# Patient Record
Sex: Female | Born: 1960 | Race: White | Hispanic: No | Smoking: Current every day smoker
Health system: Southern US, Community
[De-identification: ages and names within clinical notes are randomized; demographics above are authoritative.]

## PROBLEM LIST (undated history)

## (undated) DIAGNOSIS — I739 Peripheral vascular disease, unspecified: Secondary | ICD-10-CM

## (undated) DIAGNOSIS — J439 Emphysema, unspecified: Secondary | ICD-10-CM

## (undated) DIAGNOSIS — Z86718 Personal history of other venous thrombosis and embolism: Secondary | ICD-10-CM

## (undated) DIAGNOSIS — E079 Disorder of thyroid, unspecified: Secondary | ICD-10-CM

## (undated) DIAGNOSIS — C801 Malignant (primary) neoplasm, unspecified: Secondary | ICD-10-CM

## (undated) DIAGNOSIS — E119 Type 2 diabetes mellitus without complications: Secondary | ICD-10-CM

## (undated) DIAGNOSIS — M1990 Unspecified osteoarthritis, unspecified site: Secondary | ICD-10-CM

## (undated) DIAGNOSIS — F32A Depression, unspecified: Secondary | ICD-10-CM

## (undated) DIAGNOSIS — J45909 Unspecified asthma, uncomplicated: Secondary | ICD-10-CM

## (undated) DIAGNOSIS — J449 Chronic obstructive pulmonary disease, unspecified: Secondary | ICD-10-CM

## (undated) DIAGNOSIS — F329 Major depressive disorder, single episode, unspecified: Secondary | ICD-10-CM

## (undated) HISTORY — DX: Malignant (primary) neoplasm, unspecified: C80.1

## (undated) HISTORY — DX: Peripheral vascular disease, unspecified: I73.9

## (undated) HISTORY — DX: Unspecified osteoarthritis, unspecified site: M19.90

## (undated) HISTORY — DX: Disorder of thyroid, unspecified: E07.9

## (undated) HISTORY — DX: Type 2 diabetes mellitus without complications: E11.9

## (undated) HISTORY — DX: Major depressive disorder, single episode, unspecified: F32.9

## (undated) HISTORY — DX: Depression, unspecified: F32.A

## (undated) HISTORY — DX: Chronic obstructive pulmonary disease, unspecified: J44.9

## (undated) HISTORY — DX: Personal history of other venous thrombosis and embolism: Z86.718

## (undated) HISTORY — DX: Unspecified asthma, uncomplicated: J45.909

---
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
## 1991-01-09 HISTORY — PX: TUBAL LIGATION: SHX77

## 2011-01-09 HISTORY — PX: THYROIDECTOMY: SHX17

## 2013-01-08 HISTORY — PX: OTHER SURGICAL HISTORY: SHX169

## 2015-07-04 ENCOUNTER — Other Ambulatory Visit: Payer: Self-pay | Admitting: Family Medicine

## 2015-07-04 DIAGNOSIS — Z1231 Encounter for screening mammogram for malignant neoplasm of breast: Secondary | ICD-10-CM

## 2016-03-06 ENCOUNTER — Encounter: Payer: Self-pay | Admitting: Orthopedic Surgery

## 2016-03-06 ENCOUNTER — Ambulatory Visit (INDEPENDENT_AMBULATORY_CARE_PROVIDER_SITE_OTHER): Payer: Medicaid Other | Admitting: Orthopedic Surgery

## 2016-03-06 VITALS — BP 118/78 | HR 89 | Wt 171.0 lb

## 2016-03-06 DIAGNOSIS — G5622 Lesion of ulnar nerve, left upper limb: Secondary | ICD-10-CM | POA: Diagnosis not present

## 2016-03-06 NOTE — Progress Notes (Signed)
Patient ID: Shannon Russell, female   DOB: 1961/01/07, 56 y.o.   MRN: [id number]  Chief Complaint  Patient presents with  . Hand Pain    left hand and wrist numbness and pain    HPI Shannon Russell is a 55 y.o. female.  The patient presents with an 8 year history of pain and paresthesias in her left upper extremity involving her left small and ring finger. She has tried gabapentin and Lyrica without success. 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 She also complains of pain in the right in the left arm associated with numbness and tingling and she has weakness and she is dropping things when she tries to pick them up  Review of Systems Review of Systems (2 MINIMUM)  No past medical history on file.  No past surgical history on file.  Social History Social History  Substance Use Topics  . Smoking status: Current Every Day Smoker  . Smokeless tobacco: Never Used  . Alcohol use Not on file    Allergies  Allergen Reactions  . Iodine     Current Meds  Medication Sig  . atorvastatin (LIPITOR) 40 MG tablet Take 40 mg by mouth daily.  . clopidogrel (PLAVIX) 75 MG tablet Take 75 mg by mouth daily.  . DULoxetine (CYMBALTA) 60 MG capsule Take 60 mg by mouth daily.  Marland Kitchen gabapentin (NEURONTIN) 300 MG capsule Take 300 mg by mouth daily.  Marland Kitchen levothyroxine (SYNTHROID, LEVOTHROID) 125 MCG tablet Take 125 mcg by mouth 2 (two) times daily.  . meloxicam (MOBIC) 15 MG tablet Take 15 mg by mouth daily.  . metFORMIN (GLUCOPHAGE) 500 MG tablet Take 500 mg by mouth 2 (two) times daily with a meal.  . pregabalin (LYRICA) 75 MG capsule Take 75 mg by mouth 3 (three) times daily.  Marland Kitchen topiramate (TOPAMAX) 50 MG tablet Take 50 mg by mouth 2 (two) times daily.      Physical Exam Physical Exam 1.BP 118/78   Pulse 89   Wt 171  lb (77.6 kg)   2. Gen. appearance. The patient is well-developed and well-nourished, grooming and hygiene are normal. There are no gross congenital abnormalities  3. The patient is alert and oriented to person place and time  4. Mood and affect are normal  5. Ambulation No disturbance in her overall ambulatory ability or gait  Examination reveals the following: 6. On inspection we find tenderness over the left elbow positive Tinel's at the olecranon/medial upper condyle  7. With the range of motion of  normal range of motion of the elbow  8. Stability tests were normal  varus valgus stress test  9. Strength tests revealed grade 5 motor strength  10. Skin we find no rash ulceration or erythema  11. 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

## 2016-03-06 NOTE — Patient Instructions (Addendum)
SEE OCCUPATIONAL THERAPIST FOR SPLINT OF THE ELBOW   WEAR FOR 6 WEEKS THEN COME BACK   WORK ON SMOKING Cubital Tunnel Syndrome Cubital tunnel syndrome is a condition that causes pain and weakness of the forearm and hand. This condition happens when one of the nerves (ulnar nerve) that runs alongside the elbow joint becomes irritated. What are the causes? Causes of this condition include:  Increased pressure on the ulnar nerve at the elbow, arm, or forearm. This can be caused by:  Swollen tissues.  Ligaments.  Muscles.  Poorly healed elbow fractures.  Tumors in the elbow. These are usually noncancerous (benign).  Scar tissue that develops in the elbow after an injury.  Bony growths (spurs) near the ulnar nerve.  Stretching of the nerve due to loose elbow ligaments.  Trauma to the nerve at the elbow.  Repetitive elbow bending.  Certain medical conditions. What increases the risk? This condition is more likely to develop in:  People who do manual labor that requires frequently bending the elbow.  People who play sports that include repeated or strenuous throwing motions, such as baseball.  People who play contact sports, such as football or lacrosse.  People who do not warm up properly before activities.  People who have diabetes.  People who have an underactive thyroid (hypothyroidism). What are the signs or symptoms? Symptoms of this condition include:  Clumsiness or weakness of the hand.  Tenderness of the inner elbow.  Aching or soreness of the inner elbow, forearm, or fingers, especially the little finger or the ring finger.  Increased pain with forced elbow bending.  Reduced control when throwing.  Tingling, numbness, or burning inside the forearm, or in part of the hand or fingers, especially the little finger or the ring finger.  Sharp pains that shoot from the elbow down to the wrist and hand.  The inability to grip or pinch hard. How is this  diagnosed? This condition is diagnosed with a medical history and physical exam. Your health care provider will ask about your symptoms and ask for details about any injury. You may also have other tests, including:  Electromyogram (EMG). This test checks how well the nerve is working.  X-ray. How is this treated? Treatment starts by stopping the activities that are causing your symptoms to get worse. Treatment may include the use of icing and medicines to reduce pain and swelling. You may also be advised to wear a splint to prevent your elbow from bending or wear an elbow pad where the ulnar nerve is closest to the skin. In less severe cases, treatment may also include working with a physical therapist:  To help decrease your symptoms.  To improve the strength and range of motion of your elbow, forearm, and hand. If the treatments described above do not help, surgery may be needed. Follow these instructions at home: If you have a splint:   Wear it as told by your health care provider. Remove it only as told by your health care provider.  Loosen the splint if your fingers become numb and tingle, or if they turn cold and blue.  Keep the splint clean and dry. Managing pain, stiffness, and swelling   If directed, apply ice to the injured area:  Put ice in a plastic bag.  Place a towel between your skin and the bag.  Leave the ice on for 20 minutes, 2-3 times per day.  Move your fingers often to avoid stiffness and to lessen swelling.  Raise (elevate) the injured area above the level of your heart while you are sitting or lying down. General instructions   Take over-the-counter and prescription medicines only as told by your health care provider.  Keep all follow-up visits as told by your health care provider. This is important.  Do any exercise or physical therapy as told by your health care provider.  Do not drive or operate heavy machinery while taking prescription pain  medicine.  If you were given an elbow pad, wear it as told by your health care provider. Contact a health care provider if:  Your symptoms get worse.  Your symptoms do not get better with treatment.  Your have new pain.  Your hand on the injured side feels numb or cold. This information is not intended to replace advice given to you by your health care provider. Make sure you discuss any questions you have with your health care provider. Document Released: 12/25/2004 Document Revised: 06/02/2015 Document Reviewed: 03/03/2014 Elsevier Interactive Patient Education  2017 [employer].

## 2016-03-12 ENCOUNTER — Ambulatory Visit (HOSPITAL_COMMUNITY): Payer: Medicaid Other | Attending: Orthopedic Surgery

## 2016-03-12 DIAGNOSIS — M25522 Pain in left elbow: Secondary | ICD-10-CM | POA: Diagnosis not present

## 2016-03-12 NOTE — Therapy (Signed)
[facility] [place], [place], [address] Phone: [phone]   Fax:  [phone]  Occupational Therapy Treatment  Patient Details  Name: Donnae Donna MRN: [id number] Date of Birth: [birth year] Referring Provider: Arther Abbott, MD  Encounter Date: 03/12/2016      OT End of Session - 03/12/16 1245    Visit Number 1   Number of Visits 1   Date for OT Re-Evaluation 03/19/16   Authorization Type medicaid   Authorization Time Period coverage for 1 evaluation a year.   Authorization - Visit Number 1   Authorization - Number of Visits 1   OT Start Time 1030   OT Stop Time 1100   OT Time Calculation (min) 30 min   Activity Tolerance Patient tolerated treatment well   Behavior During Therapy WFL for tasks assessed/performed      Past Medical History:  Diagnosis Date  . Arthritis   . Asthma   . Cancer ([place])   . COPD (chronic obstructive pulmonary disease) ([place])   . Depression   . Diabetes mellitus without complication ([place])   . H/O blood clots   . PAD (peripheral artery disease) ([place])   . Thyroid disease     Past Surgical History:  Procedure Laterality Date  . Bypass right leg  2015  . CHOLECYSTECTOMY  1981  . Mesh implant right leg  2015  . THYROIDECTOMY  2013  . TUBAL LIGATION  1993    There were no vitals filed for this visit.      Subjective Assessment - 03/12/16 1037    Subjective  S: I hit my elbow on a door. It's been hurting for 9 months.   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 patient reports pain and tingling/numbness in her 4th and 5th digit that runs up her arm into her elbow. Dr. Aline Brochure has referred patient to occupational therapy for elbow splint fabrication.   Patient Stated Goals To be pain free.   Currently in Pain? Yes   Pain Score 5    Pain Location Elbow   Pain Orientation Left   Pain Descriptors / Indicators  Tingling;Numbness   Pain Type Chronic pain   Pain Onset More than a month ago  6 months   Pain Frequency Constant   Aggravating Factors  Constant. All the time it hurts.   Pain Relieving Factors Nothing   Multiple Pain Sites No            [facility] OT Assessment - 03/12/16 1243      Assessment   Diagnosis left cupital tunnel splint   Referring Provider Arther Abbott, MD   Onset Date --  9 months ago   Assessment Follow up appointment in 6 weeks   Prior Therapy None     Precautions   Precautions None     Restrictions   Weight Bearing Restrictions No     Balance Screen   Has the patient fallen in the past 6 months No     Prior Function   Level of Independence Independent   Vocation Retired     Public librarian Status Independent     Written Expression   Dominant Hand Right     Cognition   Overall Cognitive Status Within Functional Limits for tasks assessed     ROM / Strength   AROM / PROM / Strength AROM     AROM  Overall AROM  Within functional limits for tasks performed   Overall AROM Comments Patient has functional ROM for elbow, shoulder, and hand in all ranges.                  OT Treatments/Exercises (OP) - 03/12/16 1245      Splinting   Splinting Elbow extension splint fabricated with 3 straps placed. Education completed and patient had no additional questions.                OT Education - 03/12/16 1136    Education provided Yes   Education Details Patient was given education regarding wearing schedule, precautions, and cleaning techniques.   Person(s) Educated Patient   Methods Explanation;Handout   Comprehension Verbalized understanding          OT Short Term Goals - 03/12/16 1249      OT SHORT TERM GOAL #1   Title Elbow extension splint will be fabricated and patient will be able to verbalize understanding of donning/doffing, cleaning techniques, precautions, and wearing schedule.    Time 1   Period Days    Status Achieved                  Plan - 03/12/16 1246    Clinical Impression Statement A: Patient is a 56 y/o female S/P left cupital tunnel syndrome presenting to OT for splint fabrication in hopes of decreasing numbness, tingling, and pain level in order to be able to sleep as well as complete daily tasks with more comfort. Elbow extension splint was fabricated with splint placed on dorsal side of elbow to decrease chance of pressure area. Will follow up with patient on Wednesday 03/14/16 before discharging fully.    Rehab Potential Excellent   OT Frequency One time visit   OT Treatment/Interventions Splinting   Plan P: Follow up with patient on Wednesday 03/14/16 before discharging to make sure there are no issues with splint.   Consulted and Agree with Plan of Care Patient      Patient will benefit from skilled therapeutic intervention in order to improve the following deficits and impairments:  Pain, Impaired sensation  Visit Diagnosis: Pain in left elbow - Plan: Ot plan of care cert/re-cert    Problem List There are no active problems to display for this patient.  Ailene Ravel, OTR/L,CBIS  [phone]  03/12/2016, 3:10 PM  [facility] [address] [place], [place], [address] Phone: [phone]   Fax:  [phone]  Name: Anju Haithcock MRN: [id number] Date of Birth: [birth year]

## 2016-03-12 NOTE — Patient Instructions (Signed)
Your Splint This splint should initially be fitted by a healthcare practitioner.  The healthcare practitioner is responsible for providing wearing instructions and precautions to the patient, other healthcare practitioners and care provider involved in the patient's care.  This splint was custom made for you. Please read the following instructions to learn about wearing and caring for your splint.  Precautions Should your splint cause any of the following problems, remove the splint immediately and contact your therapist/physician.  Swelling  Severe Pain  Pressure Areas  Stiffness  Numbness  Do not wear your splint while operating machinery unless it has been fabricated for that purpose.  When To Wear Your Splint Where your splint according to your therapist/physician instructions. Nights and rest periods only  Care and Cleaning of Your Splint 1. Keep your splint away from open flames. 2. Your splint will lose its shape in temperatures over 135 degrees Farenheit, ( in car windows, near radiators, ovens or in hot water).  Never make any adjustments to your splint, if the splint needs adjusting remove it and make an appointment to see your therapist. 3. Your splint, including the cushion liner may be cleaned with soap and lukewarm water.  Do not immerse in hot water over 135 degrees Farenheit. 4. Straps may be washed with soap and water, but do not moisten the self-adhesive portion. 5. For ink or hard to remove spots use a scouring cleanser which contains chlorine.  Rinse the splint thoroughly after using chlorine cleanser.  

## 2016-04-17 ENCOUNTER — Ambulatory Visit: Payer: Medicaid Other | Admitting: Orthopedic Surgery

## 2016-05-01 ENCOUNTER — Ambulatory Visit: Payer: Medicaid Other | Admitting: Orthopedic Surgery

## 2016-05-02 ENCOUNTER — Encounter: Payer: Self-pay | Admitting: Orthopedic Surgery

## 2017-03-05 ENCOUNTER — Other Ambulatory Visit: Payer: Self-pay | Admitting: Family

## 2017-03-07 ENCOUNTER — Other Ambulatory Visit: Payer: Self-pay | Admitting: Family

## 2017-03-07 DIAGNOSIS — E785 Hyperlipidemia, unspecified: Secondary | ICD-10-CM

## 2017-03-07 DIAGNOSIS — I4891 Unspecified atrial fibrillation: Secondary | ICD-10-CM

## 2017-03-07 DIAGNOSIS — R222 Localized swelling, mass and lump, trunk: Secondary | ICD-10-CM

## 2017-03-07 DIAGNOSIS — I739 Peripheral vascular disease, unspecified: Secondary | ICD-10-CM

## 2017-03-14 ENCOUNTER — Encounter: Payer: Self-pay | Admitting: Gastroenterology

## 2017-03-15 ENCOUNTER — Ambulatory Visit: Payer: Medicaid Other

## 2017-04-04 ENCOUNTER — Other Ambulatory Visit (HOSPITAL_COMMUNITY): Payer: Self-pay | Admitting: Family

## 2017-04-04 ENCOUNTER — Other Ambulatory Visit: Payer: Self-pay | Admitting: Family

## 2017-04-04 DIAGNOSIS — R222 Localized swelling, mass and lump, trunk: Secondary | ICD-10-CM

## 2017-04-15 ENCOUNTER — Ambulatory Visit: Payer: Medicaid Other

## 2017-05-08 ENCOUNTER — Encounter: Payer: Self-pay | Admitting: Nurse Practitioner

## 2017-05-21 ENCOUNTER — Ambulatory Visit: Payer: Medicaid Other | Admitting: Nurse Practitioner

## 2017-06-06 ENCOUNTER — Ambulatory Visit: Payer: Medicaid Other | Admitting: Nurse Practitioner

## 2017-06-06 ENCOUNTER — Encounter: Payer: Self-pay | Admitting: Gastroenterology

## 2017-06-06 ENCOUNTER — Telehealth: Payer: Self-pay | Admitting: Nurse Practitioner

## 2017-06-06 NOTE — Telephone Encounter (Signed)
Patient was a no show and letter sent  °

## 2017-06-06 NOTE — Progress Notes (Deleted)
Primary Care Physician:  Tollie Eth, NP Primary Gastroenterologist:  Dr.   Rayne Du chief complaint on file.   HPI:   Shannon Russell is a 57 y.o. female who presents on referral from primary care to schedule colonoscopy.  Nurse/phone triage was deferred to office visit due to polypharmacy.  No history of colonoscopy or endoscopy in our system.  Today she states   Past Medical History:  Diagnosis Date  . Arthritis   . Asthma   . Cancer ([place])   . COPD (chronic obstructive pulmonary disease) ([place])   . Depression   . Diabetes mellitus without complication ([place])   . H/O blood clots   . PAD (peripheral artery disease) ([place])   . Thyroid disease     Past Surgical History:  Procedure Laterality Date  . Bypass right leg  2015  . CHOLECYSTECTOMY  1981  . Mesh implant right leg  2015  . THYROIDECTOMY  2013  . TUBAL LIGATION  1993    Current Outpatient Medications  Medication Sig Dispense Refill  . atorvastatin (LIPITOR) 40 MG tablet Take 40 mg by mouth daily.    . clopidogrel (PLAVIX) 75 MG tablet Take 75 mg by mouth daily.    . DULoxetine (CYMBALTA) 60 MG capsule Take 60 mg by mouth daily.    Marland Kitchen gabapentin (NEURONTIN) 300 MG capsule Take 300 mg by mouth daily.    Marland Kitchen levothyroxine (SYNTHROID, LEVOTHROID) 125 MCG tablet Take 125 mcg by mouth 2 (two) times daily.    . meloxicam (MOBIC) 15 MG tablet Take 15 mg by mouth daily.    . metFORMIN (GLUCOPHAGE) 500 MG tablet Take 500 mg by mouth 2 (two) times daily with a meal.    . pregabalin (LYRICA) 75 MG capsule Take 75 mg by mouth 3 (three) times daily.    Marland Kitchen topiramate (TOPAMAX) 50 MG tablet Take 50 mg by mouth 2 (two) times daily.     No current facility-administered medications for this visit.     Allergies as of 06/06/2017 - Review Complete 03/06/2016  Allergen Reaction Noted  . Iodine  03/06/2016    Family History  Problem Relation Age of Onset  . Heart failure Mother   . Depression Mother   . Peripheral Artery Disease  Sister   . Cancer Sister   . Clotting disorder Sister   . Depression Sister   . Thyroid disease Sister     Social History   Socioeconomic History  . Marital status: Legally Separated    Spouse name: Not on file  . Number of children: Not on file  . Years of education: Not on file  . Highest education level: Not on file  Occupational History  . Not on file  Social Needs  . Financial resource strain: Not on file  . Food insecurity:    Worry: Not on file    Inability: Not on file  . Transportation needs:    Medical: Not on file    Non-medical: Not on file  Tobacco Use  . Smoking status: Current Every Day Smoker  . Smokeless tobacco: Never Used  Substance and Sexual Activity  . Alcohol use: Not on file  . Drug use: Not on file  . Sexual activity: Not on file  Lifestyle  . Physical activity:    Days per week: Not on file    Minutes per session: Not on file  . Stress: Not on file  Relationships  . Social connections:    Talks on phone:  Not on file    Gets together: Not on file    Attends religious service: Not on file    Active member of club or organization: Not on file    Attends meetings of clubs or organizations: Not on file    Relationship status: Not on file  . Intimate partner violence:    Fear of current or ex partner: Not on file    Emotionally abused: Not on file    Physically abused: Not on file    Forced sexual activity: Not on file  Other Topics Concern  . Not on file  Social History Narrative  . Not on file    Review of Systems: General: Negative for anorexia, weight loss, fever, chills, fatigue, weakness. Eyes: Negative for vision changes.  ENT: Negative for hoarseness, difficulty swallowing , nasal congestion. CV: Negative for chest pain, angina, palpitations, dyspnea on exertion, peripheral edema.  Respiratory: Negative for dyspnea at rest, dyspnea on exertion, cough, sputum, wheezing.  GI: See history of present illness. GU:  Negative for  dysuria, hematuria, urinary incontinence, urinary frequency, nocturnal urination.  MS: Negative for joint pain, low back pain.  Derm: Negative for rash or itching.  Neuro: Negative for weakness, abnormal sensation, seizure, frequent headaches, memory loss, confusion.  Psych: Negative for anxiety, depression, suicidal ideation, hallucinations.  Endo: Negative for unusual weight change.  Heme: Negative for bruising or bleeding. Allergy: Negative for rash or hives.    Physical Exam: There were no vitals taken for this visit. General:   Alert and oriented. Pleasant and cooperative. Well-nourished and well-developed.  Head:  Normocephalic and atraumatic. Eyes:  Without icterus, sclera clear and conjunctiva pink.  Ears:  Normal auditory acuity. Mouth:  No deformity or lesions, oral mucosa pink.  Throat/Neck:  Supple, without mass or thyromegaly. Cardiovascular:  S1, S2 present without murmurs appreciated. Normal pulses noted. Extremities without clubbing or edema. Respiratory:  Clear to auscultation bilaterally. No wheezes, rales, or rhonchi. No distress.  Gastrointestinal:  +BS, soft, non-tender and non-distended. No HSM noted. No guarding or rebound. No masses appreciated.  Rectal:  Deferred  Musculoskalatal:  Symmetrical without gross deformities. Normal posture. Skin:  Intact without significant lesions or rashes. Neurologic:  Alert and oriented x4;  grossly normal neurologically. Psych:  Alert and cooperative. Normal mood and affect. Heme/Lymph/Immune: No significant cervical adenopathy. No excessive bruising noted.    06/06/2017 1:16 PM   Disclaimer: This note was dictated with voice recognition software. Similar sounding words can inadvertently be transcribed and may not be corrected upon review.

## 2017-06-07 NOTE — Telephone Encounter (Signed)
Noted  

## 2018-04-20 ENCOUNTER — Inpatient Hospital Stay (HOSPITAL_COMMUNITY)
Admission: EM | Admit: 2018-04-20 | Discharge: 2018-04-23 | DRG: 190 | Disposition: A | Discharge status: Home / Self Care | Payer: Medicaid Other | Attending: Family Medicine | Admitting: Family Medicine

## 2018-04-20 ENCOUNTER — Emergency Department (HOSPITAL_COMMUNITY): Payer: Medicaid Other

## 2018-04-20 ENCOUNTER — Other Ambulatory Visit: Payer: Self-pay

## 2018-04-20 ENCOUNTER — Encounter (HOSPITAL_COMMUNITY): Payer: Self-pay | Admitting: Emergency Medicine

## 2018-04-20 DIAGNOSIS — E669 Obesity, unspecified: Secondary | ICD-10-CM | POA: Diagnosis not present

## 2018-04-20 DIAGNOSIS — Z20828 Contact with and (suspected) exposure to other viral communicable diseases: Secondary | ICD-10-CM | POA: Diagnosis present

## 2018-04-20 DIAGNOSIS — E1165 Type 2 diabetes mellitus with hyperglycemia: Secondary | ICD-10-CM | POA: Diagnosis present

## 2018-04-20 DIAGNOSIS — Z888 Allergy status to other drugs, medicaments and biological substances status: Secondary | ICD-10-CM

## 2018-04-20 DIAGNOSIS — J441 Chronic obstructive pulmonary disease with (acute) exacerbation: Secondary | ICD-10-CM | POA: Diagnosis present

## 2018-04-20 DIAGNOSIS — E890 Postprocedural hypothyroidism: Secondary | ICD-10-CM | POA: Diagnosis present

## 2018-04-20 DIAGNOSIS — Z7984 Long term (current) use of oral hypoglycemic drugs: Secondary | ICD-10-CM | POA: Diagnosis not present

## 2018-04-20 DIAGNOSIS — Z7902 Long term (current) use of antithrombotics/antiplatelets: Secondary | ICD-10-CM | POA: Diagnosis not present

## 2018-04-20 DIAGNOSIS — Z86718 Personal history of other venous thrombosis and embolism: Secondary | ICD-10-CM

## 2018-04-20 DIAGNOSIS — Z818 Family history of other mental and behavioral disorders: Secondary | ICD-10-CM | POA: Diagnosis not present

## 2018-04-20 DIAGNOSIS — E1151 Type 2 diabetes mellitus with diabetic peripheral angiopathy without gangrene: Secondary | ICD-10-CM | POA: Diagnosis present

## 2018-04-20 DIAGNOSIS — Z79890 Hormone replacement therapy (postmenopausal): Secondary | ICD-10-CM

## 2018-04-20 DIAGNOSIS — F17200 Nicotine dependence, unspecified, uncomplicated: Secondary | ICD-10-CM

## 2018-04-20 DIAGNOSIS — G43909 Migraine, unspecified, not intractable, without status migrainosus: Secondary | ICD-10-CM | POA: Diagnosis present

## 2018-04-20 DIAGNOSIS — I739 Peripheral vascular disease, unspecified: Secondary | ICD-10-CM

## 2018-04-20 DIAGNOSIS — M1990 Unspecified osteoarthritis, unspecified site: Secondary | ICD-10-CM | POA: Diagnosis not present

## 2018-04-20 DIAGNOSIS — F17208 Nicotine dependence, unspecified, with other nicotine-induced disorders: Secondary | ICD-10-CM | POA: Diagnosis not present

## 2018-04-20 DIAGNOSIS — F329 Major depressive disorder, single episode, unspecified: Secondary | ICD-10-CM | POA: Diagnosis present

## 2018-04-20 DIAGNOSIS — Z79899 Other long term (current) drug therapy: Secondary | ICD-10-CM | POA: Diagnosis not present

## 2018-04-20 DIAGNOSIS — F17210 Nicotine dependence, cigarettes, uncomplicated: Secondary | ICD-10-CM | POA: Diagnosis present

## 2018-04-20 DIAGNOSIS — R6889 Other general symptoms and signs: Secondary | ICD-10-CM

## 2018-04-20 DIAGNOSIS — E785 Hyperlipidemia, unspecified: Secondary | ICD-10-CM | POA: Diagnosis present

## 2018-04-20 DIAGNOSIS — E079 Disorder of thyroid, unspecified: Secondary | ICD-10-CM | POA: Diagnosis present

## 2018-04-20 DIAGNOSIS — Z6831 Body mass index (BMI) 31.0-31.9, adult: Secondary | ICD-10-CM

## 2018-04-20 DIAGNOSIS — I493 Ventricular premature depolarization: Secondary | ICD-10-CM | POA: Diagnosis not present

## 2018-04-20 DIAGNOSIS — Z20822 Contact with and (suspected) exposure to covid-19: Secondary | ICD-10-CM

## 2018-04-20 DIAGNOSIS — Z8249 Family history of ischemic heart disease and other diseases of the circulatory system: Secondary | ICD-10-CM

## 2018-04-20 DIAGNOSIS — J9601 Acute respiratory failure with hypoxia: Secondary | ICD-10-CM | POA: Diagnosis present

## 2018-04-20 DIAGNOSIS — J449 Chronic obstructive pulmonary disease, unspecified: Secondary | ICD-10-CM | POA: Diagnosis present

## 2018-04-20 LAB — CBC WITH DIFFERENTIAL/PLATELET
Abs Immature Granulocytes: 0.07 10*3/uL (ref 0.00–0.07)
Basophils Absolute: 0.1 10*3/uL (ref 0.0–0.1)
Basophils Relative: 0 %
Eosinophils Absolute: 1.2 10*3/uL — ABNORMAL HIGH (ref 0.0–0.5)
Eosinophils Relative: 7 %
HCT: 43.1 % (ref 36.0–46.0)
Hemoglobin: 14.3 g/dL (ref 12.0–15.0)
Immature Granulocytes: 0 %
Lymphocytes Relative: 24 %
Lymphs Abs: 4 10*3/uL (ref 0.7–4.0)
MCH: 31.8 pg (ref 26.0–34.0)
MCHC: 33.2 g/dL (ref 30.0–36.0)
MCV: 96 fL (ref 80.0–100.0)
Monocytes Absolute: 1 10*3/uL (ref 0.1–1.0)
Monocytes Relative: 6 %
Neutro Abs: 10.6 10*3/uL — ABNORMAL HIGH (ref 1.7–7.7)
Neutrophils Relative %: 63 %
Platelets: 305 10*3/uL (ref 150–400)
RBC: 4.49 MIL/uL (ref 3.87–5.11)
RDW: 13.9 % (ref 11.5–15.5)
WBC: 16.9 10*3/uL — ABNORMAL HIGH (ref 4.0–10.5)
nRBC: 0 % (ref 0.0–0.2)

## 2018-04-20 LAB — BASIC METABOLIC PANEL
Anion gap: 11 (ref 5–15)
BUN: 14 mg/dL (ref 6–20)
CO2: 25 mmol/L (ref 22–32)
Calcium: 8.9 mg/dL (ref 8.9–10.3)
Chloride: 102 mmol/L (ref 98–111)
Creatinine, Ser: 1.05 mg/dL — ABNORMAL HIGH (ref 0.44–1.00)
GFR calc Af Amer: >60 mL/min (ref >60)
GFR calc non Af Amer: 59 mL/min — ABNORMAL LOW (ref >60)
Glucose, Bld: 134 mg/dL — ABNORMAL HIGH (ref 70–99)
Potassium: 4.1 mmol/L (ref 3.5–5.1)
Sodium: 138 mmol/L (ref 135–145)

## 2018-04-20 MED ORDER — ALBUTEROL SULFATE HFA 108 (90 BASE) MCG/ACT IN AERS
1.0000 | INHALATION_SPRAY | Freq: Once | RESPIRATORY_TRACT | Status: AC
  Administered 2018-04-20: 2 via RESPIRATORY_TRACT
  Filled 2018-04-20: qty 6.7

## 2018-04-20 MED ORDER — MAGNESIUM SULFATE 2 GM/50ML IV SOLN
2.0000 g | Freq: Once | INTRAVENOUS | Status: AC
  Administered 2018-04-20: 2 g via INTRAVENOUS
  Filled 2018-04-20: qty 50

## 2018-04-20 MED ORDER — METHYLPREDNISOLONE SODIUM SUCC 125 MG IJ SOLR
125.0000 mg | Freq: Once | INTRAMUSCULAR | Status: AC
  Administered 2018-04-20: 125 mg via INTRAVENOUS
  Filled 2018-04-20: qty 2

## 2018-04-20 NOTE — ED Provider Notes (Signed)
[facility] EMERGENCY DEPARTMENT Provider Note   CSN: [id number] Arrival date & time: 04/20/18  2141    History   Chief Complaint Chief Complaint  Patient presents with  . Shortness of Breath    HPI Shannon Russell is a 58 y.o. female who presents with shortness of breath. PMH significant for COPD, smoking (1ppd) PAD, non-insulin dependent DM, hx of DVT, thyroid disease, hx of cancer. She states she's been having SOB, wheezing, coughing for 2 months. She has been seen by her PCP multiple times for this problem. She has been on several rounds of steroids and antibiotics and isn't improving. She's had Amoxicillin, Levaquin, and most recently a Z-pack. Symptoms have been gradually worsening over the past week. She reports low grade fevers. No URI symptoms. She has some chest pain with coughing - denies any CP recently. Cough is productive of sputum which changes colors - sometimes white, green, and now clear. She has been using her inhalers and nebulizer without relief. Tonight she felt more SOB so called EMS. She has not had any sick contacts and has not traveled.     HPI  Past Medical History:  Diagnosis Date  . Arthritis   . Asthma   . Cancer ([place])   . COPD (chronic obstructive pulmonary disease) ([place])   . Depression   . Diabetes mellitus without complication ([place])   . H/O blood clots   . PAD (peripheral artery disease) ([place])   . Thyroid disease     There are no active problems to display for this patient.   Past Surgical History:  Procedure Laterality Date  . Bypass right leg  2015  . CHOLECYSTECTOMY  1981  . Mesh implant right leg  2015  . THYROIDECTOMY  2013  . TUBAL LIGATION  1993     OB History   No obstetric history on file.      Home Medications    Prior to Admission medications   Medication Sig Start Date End Date Taking? Authorizing Provider  atorvastatin (LIPITOR) 40 MG tablet Take 40 mg by mouth daily.    [provider]  clopidogrel  (PLAVIX) 75 MG tablet Take 75 mg by mouth daily.    [provider]  DULoxetine (CYMBALTA) 60 MG capsule Take 60 mg by mouth daily.    [provider]  gabapentin (NEURONTIN) 300 MG capsule Take 300 mg by mouth daily.    [provider]  levothyroxine (SYNTHROID, LEVOTHROID) 125 MCG tablet Take 125 mcg by mouth 2 (two) times daily.    [provider]  meloxicam (MOBIC) 15 MG tablet Take 15 mg by mouth daily.    [provider]  metFORMIN (GLUCOPHAGE) 500 MG tablet Take 500 mg by mouth 2 (two) times daily with a meal.    [provider]  pregabalin (LYRICA) 75 MG capsule Take 75 mg by mouth 3 (three) times daily.    [provider]  topiramate (TOPAMAX) 50 MG tablet Take 50 mg by mouth 2 (two) times daily.    [provider]    Family History Family History  Problem Relation Age of Onset  . Heart failure Mother   . Depression Mother   . Peripheral Artery Disease Sister   . Cancer Sister   . Clotting disorder Sister   . Depression Sister   . Thyroid disease Sister     Social History Social History   Tobacco Use  . Smoking status: Current Every Day Smoker  . Smokeless tobacco:  Never Used  Substance Use Topics  . Alcohol use: Not Currently  . Drug use: Not Currently     Allergies   Iodine   Review of Systems Review of Systems  Constitutional: Positive for fever.  HENT: Negative for congestion and rhinorrhea.   Respiratory: Positive for cough, shortness of breath and wheezing.   Cardiovascular: Positive for chest pain (with coughing) and leg swelling. Negative for palpitations.  All other systems reviewed and are negative.    Physical Exam Updated Vital Signs BP (!) 114/100 (BP Location: Right Arm)   Pulse (!) 101   Temp 98.7 F (37.1 C) (Oral)   Resp (!) 26   Ht 5\' 3"  (1.6 m)   Wt 80.7 kg   SpO2 96% Comment: pt 88-89% on RA   BMI 31.53 kg/m   Physical Exam Vitals signs and nursing note  reviewed.  Constitutional:      General: She is not in acute distress.    Appearance: Normal appearance. She is well-developed. She is not ill-appearing.     Comments: Calm and cooperative. Short of breath with talking  HENT:     Head: Normocephalic and atraumatic.  Eyes:     General: No scleral icterus.       Right eye: No discharge.        Left eye: No discharge.     Conjunctiva/sclera: Conjunctivae normal.     Pupils: Pupils are equal, round, and reactive to light.  Neck:     Musculoskeletal: Normal range of motion.  Cardiovascular:     Rate and Rhythm: Normal rate and regular rhythm.  Pulmonary:     Effort: Pulmonary effort is normal. No respiratory distress.     Breath sounds: Wheezing (diffuse expiratory wheezes) present.  Abdominal:     General: There is no distension.  Musculoskeletal:     Right lower leg: No edema.     Left lower leg: No edema.  Skin:    General: Skin is warm and dry.  Neurological:     Mental Status: She is alert and oriented to person, place, and time.  Psychiatric:        Behavior: Behavior normal.      ED Treatments / Results  Labs (all labs ordered are listed, but only abnormal results are displayed) Labs Reviewed  BASIC METABOLIC PANEL - Abnormal; Notable for the following components:      Result Value   Glucose, Bld 134 (*)    Creatinine, Ser 1.05 (*)    GFR calc non Af Amer 59 (*)    All other components within normal limits  CBC WITH DIFFERENTIAL/PLATELET - Abnormal; Notable for the following components:   WBC 16.9 (*)    Neutro Abs 10.6 (*)    Eosinophils Absolute 1.2 (*)    All other components within normal limits    EKG None  Radiology Dg Chest Port 1 View  Result Date: 04/20/2018 CLINICAL DATA:  Cough and wheezing EXAM: PORTABLE CHEST 1 VIEW COMPARISON:  02/09/2010 FINDINGS: Cardiac shadows within normal limits. The lungs are well aerated bilaterally. No focal infiltrate or sizable effusion is seen. No acute bony  abnormality is noted. IMPRESSION: No acute abnormality noted. Electronically Signed   By: Inez Catalina M.D.   On: 04/20/2018 22:54    Procedures Procedures (including critical care time)  Medications Ordered in ED Medications  methylPREDNISolone sodium succinate (SOLU-MEDROL) 125 mg/2 mL injection 125 mg (125 mg Intravenous Given 04/20/18 2224)  albuterol (PROVENTIL HFA;VENTOLIN HFA) 108 (  90 Base) MCG/ACT inhaler 1-2 puff (2 puffs Inhalation Given 04/20/18 2220)  magnesium sulfate IVPB 2 g 50 mL (2 g Intravenous New Bag/Given 04/20/18 2224)     Initial Impression / Assessment and Plan / ED Course  I have reviewed the triage vital signs and the nursing notes.  Pertinent labs & imaging results that were available during my care of the patient were reviewed by me and considered in my medical decision making (see chart for details).  58 year old female presents with cough, wheezing, SOB. It has been going on for a couple months but worse over the past week. She has received multiple treatments as an outpatient and has not improved although she continues to smoke. She is initially hypoxic in triage. Sats are 88% on RA. She does not wear O2 at home. Sats are improved after 4L O2 via [place]. She is afebrile here. HR is mildly elevated. She is mildly tachypneic with talking. Lungs sounds reveal diffuse wheezing. Will obtain labs, CXR, EKG. Will treat as COPD exacerbation with albuterol, solu-medrol, mag. I think less likely she has COVID without true fever and the length of her symptoms although cannot rule this out completely. She had some CP without coughing last week - sounds atypical. Doubt ACS or PE due to length of symptoms. There are no clinical signs of DVT on exam and no risk factors although she is not PERC negative. EKG is sinus tachycardia with PVCs.  CBC is remarkable for leukocytosis of 16. She has elevated neutrophils and eosinophils. BMP shows mild hyperglycemia (134). CXR is negative. She  feels a little better after treatment. Shared visit with Dr. Lacinda Axon. Will admit for further management. Discussed with Dr. Maudie Mercury who will come to see pt.    Final Clinical Impressions(s) / ED Diagnoses   Final diagnoses:  COPD exacerbation [facility])    ED Discharge Orders    None       Iris Pert 04/20/18 2329    Nat Christen, MD 05/03/18 [phone]

## 2018-04-20 NOTE — ED Triage Notes (Signed)
Pt here for SOB, pt reports she has not felt well since Feb and has seen PCP with no relief from meds, pt reports feeling body aches, low-grade fever, productive cough that has changed from thick yellow/green to white, hx of COPD and PVD, pt denies travel and any possible COVID exposure

## 2018-04-21 ENCOUNTER — Other Ambulatory Visit: Payer: Self-pay

## 2018-04-21 DIAGNOSIS — E1151 Type 2 diabetes mellitus with diabetic peripheral angiopathy without gangrene: Secondary | ICD-10-CM

## 2018-04-21 DIAGNOSIS — J441 Chronic obstructive pulmonary disease with (acute) exacerbation: Principal | ICD-10-CM

## 2018-04-21 DIAGNOSIS — I739 Peripheral vascular disease, unspecified: Secondary | ICD-10-CM

## 2018-04-21 DIAGNOSIS — R6889 Other general symptoms and signs: Secondary | ICD-10-CM

## 2018-04-21 LAB — COMPREHENSIVE METABOLIC PANEL
ALT: 22 U/L (ref 0–44)
AST: 14 U/L — ABNORMAL LOW (ref 15–41)
Albumin: 3.8 g/dL (ref 3.5–5.0)
Alkaline Phosphatase: 115 U/L (ref 38–126)
Anion gap: 9 (ref 5–15)
BUN: 14 mg/dL (ref 6–20)
CO2: 24 mmol/L (ref 22–32)
Calcium: 9 mg/dL (ref 8.9–10.3)
Chloride: 104 mmol/L (ref 98–111)
Creatinine, Ser: 1.13 mg/dL — ABNORMAL HIGH (ref 0.44–1.00)
GFR calc Af Amer: >60 mL/min (ref >60)
GFR calc non Af Amer: 54 mL/min — ABNORMAL LOW (ref >60)
Glucose, Bld: 266 mg/dL — ABNORMAL HIGH (ref 70–99)
Potassium: 4.7 mmol/L (ref 3.5–5.1)
Sodium: 137 mmol/L (ref 135–145)
Total Bilirubin: 0.5 mg/dL (ref 0.3–1.2)
Total Protein: 7.6 g/dL (ref 6.5–8.1)

## 2018-04-21 LAB — CBC WITH DIFFERENTIAL/PLATELET
Abs Immature Granulocytes: 0.08 10*3/uL — ABNORMAL HIGH (ref 0.00–0.07)
Basophils Absolute: 0 10*3/uL (ref 0.0–0.1)
Basophils Relative: 0 %
Eosinophils Absolute: 0.1 10*3/uL (ref 0.0–0.5)
Eosinophils Relative: 1 %
HCT: 43.9 % (ref 36.0–46.0)
Hemoglobin: 13.9 g/dL (ref 12.0–15.0)
Immature Granulocytes: 1 %
Lymphocytes Relative: 10 %
Lymphs Abs: 1.1 10*3/uL (ref 0.7–4.0)
MCH: 30.8 pg (ref 26.0–34.0)
MCHC: 31.7 g/dL (ref 30.0–36.0)
MCV: 97.3 fL (ref 80.0–100.0)
Monocytes Absolute: 0.1 10*3/uL (ref 0.1–1.0)
Monocytes Relative: 1 %
Neutro Abs: 10.4 10*3/uL — ABNORMAL HIGH (ref 1.7–7.7)
Neutrophils Relative %: 87 %
Platelets: 279 10*3/uL (ref 150–400)
RBC: 4.51 MIL/uL (ref 3.87–5.11)
RDW: 13.7 % (ref 11.5–15.5)
WBC: 11.9 10*3/uL — ABNORMAL HIGH (ref 4.0–10.5)
nRBC: 0 % (ref 0.0–0.2)

## 2018-04-21 LAB — HEPATIC FUNCTION PANEL
ALT: 22 U/L (ref 0–44)
AST: 16 U/L (ref 15–41)
Albumin: 3.6 g/dL (ref 3.5–5.0)
Alkaline Phosphatase: 109 U/L (ref 38–126)
Bilirubin, Direct: <0.1 mg/dL (ref 0.0–0.2)
Total Bilirubin: 0.2 mg/dL — ABNORMAL LOW (ref 0.3–1.2)
Total Protein: 7.2 g/dL (ref 6.5–8.1)

## 2018-04-21 LAB — RESPIRATORY PANEL BY PCR

## 2018-04-21 LAB — D-DIMER, QUANTITATIVE: D-Dimer, Quant: 0.44 ug/mL-FEU (ref 0.00–0.50)

## 2018-04-21 LAB — INFLUENZA PANEL BY PCR (TYPE A & B)
Influenza A By PCR: NEGATIVE
Influenza B By PCR: NEGATIVE

## 2018-04-21 LAB — LACTATE DEHYDROGENASE: LDH: 164 U/L (ref 98–192)

## 2018-04-21 LAB — GLUCOSE, CAPILLARY
Glucose-Capillary: 216 mg/dL — ABNORMAL HIGH (ref 70–99)
Glucose-Capillary: 261 mg/dL — ABNORMAL HIGH (ref 70–99)
Glucose-Capillary: 255 mg/dL — ABNORMAL HIGH (ref 70–99)
Glucose-Capillary: 269 mg/dL — ABNORMAL HIGH (ref 70–99)
Glucose-Capillary: 408 mg/dL — ABNORMAL HIGH (ref 70–99)

## 2018-04-21 LAB — SARS CORONAVIRUS 2 BY RT PCR (HOSPITAL ORDER, PERFORMED IN ~~LOC~~ HOSPITAL LAB): SARS Coronavirus 2: NEGATIVE

## 2018-04-21 LAB — FERRITIN: Ferritin: 111 ng/mL (ref 11–307)

## 2018-04-21 LAB — C-REACTIVE PROTEIN: CRP: 10.4 mg/dL — ABNORMAL HIGH (ref <1.0)

## 2018-04-21 LAB — TROPONIN I: Troponin I: <0.03 ng/mL (ref <0.03)

## 2018-04-21 LAB — PROCALCITONIN: Procalcitonin: <0.1 ng/mL

## 2018-04-21 LAB — SEDIMENTATION RATE: Sed Rate: 41 mm/hr — ABNORMAL HIGH (ref 0–22)

## 2018-04-21 MED ORDER — SODIUM CHLORIDE 0.9% FLUSH: 3.0000 mL | INTRAVENOUS | Status: DC | PRN

## 2018-04-21 MED ORDER — LEVOTHYROXINE SODIUM 25 MCG PO TABS
125.0000 ug | ORAL_TABLET | Freq: Every day | ORAL | Status: DC
  Administered 2018-04-21 – 2018-04-23 (×3): 125 ug via ORAL
  Filled 2018-04-21 (×3): qty 1

## 2018-04-21 MED ORDER — METHYLPREDNISOLONE SODIUM SUCC 125 MG IJ SOLR
80.0000 mg | Freq: Three times a day (TID) | INTRAMUSCULAR | Status: DC
  Administered 2018-04-21 – 2018-04-22 (×4): 80 mg via INTRAVENOUS
  Filled 2018-04-21 (×4): qty 2

## 2018-04-21 MED ORDER — UMECLIDINIUM BROMIDE 62.5 MCG/INH IN AEPB
1.0000 | INHALATION_SPRAY | Freq: Every day | RESPIRATORY_TRACT | Status: DC
  Administered 2018-04-21 – 2018-04-23 (×3): 1 via RESPIRATORY_TRACT
  Filled 2018-04-21: qty 7

## 2018-04-21 MED ORDER — ALBUTEROL SULFATE HFA 108 (90 BASE) MCG/ACT IN AERS
2.0000 | INHALATION_SPRAY | Freq: Four times a day (QID) | RESPIRATORY_TRACT | Status: DC
  Administered 2018-04-21 – 2018-04-22 (×6): 2 via RESPIRATORY_TRACT

## 2018-04-21 MED ORDER — INSULIN ASPART 100 UNIT/ML ~~LOC~~ SOLN
0.0000 [IU] | Freq: Every day | SUBCUTANEOUS | Status: DC
  Administered 2018-04-22: 4 [IU] via SUBCUTANEOUS

## 2018-04-21 MED ORDER — SODIUM CHLORIDE 0.9% FLUSH
3.0000 mL | Freq: Two times a day (BID) | INTRAVENOUS | Status: DC
  Administered 2018-04-21 – 2018-04-23 (×5): 3 mL via INTRAVENOUS

## 2018-04-21 MED ORDER — ALBUTEROL SULFATE HFA 108 (90 BASE) MCG/ACT IN AERS
2.0000 | INHALATION_SPRAY | Freq: Four times a day (QID) | RESPIRATORY_TRACT | Status: DC | PRN
  Administered 2018-04-21 – 2018-04-22 (×2): 2 via RESPIRATORY_TRACT
  Filled 2018-04-21: qty 6.7

## 2018-04-21 MED ORDER — ACETAMINOPHEN 325 MG PO TABS: 650.0000 mg | ORAL_TABLET | Freq: Four times a day (QID) | ORAL | Status: DC | PRN

## 2018-04-21 MED ORDER — TOPIRAMATE 25 MG PO TABS: 50.0000 mg | ORAL_TABLET | Freq: Two times a day (BID) | ORAL | Status: DC

## 2018-04-21 MED ORDER — DULOXETINE HCL 60 MG PO CPEP: 60.0000 mg | ORAL_CAPSULE | Freq: Every day | ORAL | Status: DC

## 2018-04-21 MED ORDER — NICOTINE 21 MG/24HR TD PT24
21.0000 mg | MEDICATED_PATCH | Freq: Every day | TRANSDERMAL | Status: DC
  Administered 2018-04-21 – 2018-04-23 (×3): 21 mg via TRANSDERMAL
  Filled 2018-04-21 (×3): qty 1

## 2018-04-21 MED ORDER — ATORVASTATIN CALCIUM 40 MG PO TABS
40.0000 mg | ORAL_TABLET | Freq: Every day | ORAL | Status: DC
  Administered 2018-04-21 – 2018-04-23 (×3): 40 mg via ORAL
  Filled 2018-04-21 (×3): qty 1

## 2018-04-21 MED ORDER — METFORMIN HCL 500 MG PO TABS
500.0000 mg | ORAL_TABLET | Freq: Two times a day (BID) | ORAL | Status: DC
  Administered 2018-04-21 – 2018-04-22 (×3): 500 mg via ORAL
  Filled 2018-04-21 (×3): qty 1

## 2018-04-21 MED ORDER — INSULIN ASPART 100 UNIT/ML ~~LOC~~ SOLN
12.0000 [IU] | Freq: Once | SUBCUTANEOUS | Status: AC
  Administered 2018-04-21: 12 [IU] via SUBCUTANEOUS

## 2018-04-21 MED ORDER — PREGABALIN 75 MG PO CAPS: 75.0000 mg | ORAL_CAPSULE | Freq: Three times a day (TID) | ORAL | Status: DC

## 2018-04-21 MED ORDER — SODIUM CHLORIDE 0.9 % IV SOLN: 250.0000 mL | INTRAVENOUS | Status: DC | PRN

## 2018-04-21 MED ORDER — CLOPIDOGREL BISULFATE 75 MG PO TABS
75.0000 mg | ORAL_TABLET | Freq: Every day | ORAL | Status: DC
  Administered 2018-04-21 – 2018-04-23 (×3): 75 mg via ORAL
  Filled 2018-04-21 (×3): qty 1

## 2018-04-21 MED ORDER — ENOXAPARIN SODIUM 40 MG/0.4ML ~~LOC~~ SOLN
40.0000 mg | SUBCUTANEOUS | Status: DC
  Administered 2018-04-21: 40 mg via SUBCUTANEOUS
  Filled 2018-04-21: qty 0.4

## 2018-04-21 MED ORDER — INSULIN GLARGINE 100 UNIT/ML ~~LOC~~ SOLN
12.0000 [IU] | Freq: Every day | SUBCUTANEOUS | Status: DC
  Administered 2018-04-21 – 2018-04-22 (×2): 12 [IU] via SUBCUTANEOUS
  Filled 2018-04-21 (×3): qty 0.12

## 2018-04-21 MED ORDER — INSULIN ASPART 100 UNIT/ML ~~LOC~~ SOLN
0.0000 [IU] | Freq: Three times a day (TID) | SUBCUTANEOUS | Status: DC
  Administered 2018-04-21 – 2018-04-22 (×4): 5 [IU] via SUBCUTANEOUS
  Administered 2018-04-22: 2 [IU] via SUBCUTANEOUS
  Administered 2018-04-22: 5 [IU] via SUBCUTANEOUS
  Administered 2018-04-23: 2 [IU] via SUBCUTANEOUS

## 2018-04-21 MED ORDER — PREGABALIN 75 MG PO CAPS
75.0000 mg | ORAL_CAPSULE | Freq: Three times a day (TID) | ORAL | Status: DC
  Administered 2018-04-21 – 2018-04-23 (×7): 75 mg via ORAL
  Filled 2018-04-21 (×7): qty 1

## 2018-04-21 MED ORDER — ASPIRIN EC 81 MG PO TBEC
81.0000 mg | DELAYED_RELEASE_TABLET | Freq: Every day | ORAL | Status: DC
  Administered 2018-04-21 – 2018-04-23 (×3): 81 mg via ORAL
  Filled 2018-04-21 (×3): qty 1

## 2018-04-21 MED ORDER — SODIUM CHLORIDE 0.9 % IV SOLN
500.0000 mg | INTRAVENOUS | Status: DC
  Administered 2018-04-21 – 2018-04-22 (×2): 500 mg via INTRAVENOUS
  Filled 2018-04-21 (×3): qty 500

## 2018-04-21 NOTE — ED Notes (Signed)
Report given to Abby on 4W at [facility]

## 2018-04-21 NOTE — Progress Notes (Signed)
Inpatient Diabetes Program Recommendations  AACE/ADA: New Consensus Statement on Inpatient Glycemic Control (2015)  Target Ranges:  Prepandial:   less than 140 mg/dL      Peak postprandial:   less than 180 mg/dL (1-2 hours)      Critically ill patients:  140 - 180 mg/dL   Lab Results  Component Value Date   GLUCAP 255 (H) 04/21/2018    Review of Glycemic Control  Diabetes history: DM2 Outpatient Diabetes medications: metformin 500 mg bid Current orders for Inpatient glycemic control: Novolog 0-9 units tidwc and hs, metformin 500 mg bid  On Solumedrol 80 mg Q8H. Blood sugars 216-266 mg/dL. Needs additional insulin.  Inpatient Diabetes Program Recommendations:     Add Lantus 12 units QHS Add Novolog 3 units tidwc for meal coverage insulin if pt eating > 50% meal.  Will follow CBG trends daily.  Thank you. Lorenda Peck, RD, LDN, CDE Inpatient Diabetes Coordinator [phone]

## 2018-04-21 NOTE — Plan of Care (Signed)
58 year old female admitted this morning by 1 of my partners with history of asthma COPD tobacco abuse with increasing shortness of breath and  subjective fever for 10 days.  Patient reports she is not been admitted to the hospital in a long time for the same complaints.  Not on oxygen at home.  Chest x-ray was negative.  White count 16.9.  Patient is admitted with a diagnosis of COPD exacerbation on Solu-Medrol azithromycin and albuterol inhaler.  She is also admitted as a rule out corona virus due to new hypoxia 88% on room air.  She reports she has not had any sick contacts nor has she left the house.  Significant labs include creatinine 1.13 ferritin 111 LDH 164 CRP 10.4 procalcitonin less than 0.10 sed rate 41 d-dimer 0.44 influenza panel negative respiratory virus panel and coronavirus pending.

## 2018-04-21 NOTE — ED Notes (Signed)
Pt informed she is going to [facility] and will be notified when her bed is ready; pt OOB to bathroom and back to bed, pt became very SOB while ambulating

## 2018-04-21 NOTE — H&P (Addendum)
TRH H&P    Patient Demographics:    Shannon Russell, is a 58 y.o. female  MRN: [id number]  DOB - August 01, 1960  Admit Date - 04/20/2018  Referring MD/NP/PA:  Janetta Hora   Outpatient Primary MD for the patient is Tollie Eth, NP  Patient coming from:  home  Chief complaint- dyspnea   HPI:    Shannon Russell  is a 58 y.o. female, w Asthma/ Copd, Tobacco abuse, PAD, Hypothyroidism, Migraines, apparently c/o dyspnea for the past 1.5 weeks, along with cough, initially productive yellow, now white, sore throat, subjective fever, and myalgia.  Pt states she was seen by her PCP and tx with zithromax without improvement.  She has also received levaquin in the the past 2 months.  Pt presented tonight due to increase in dyspnea and wheezing.  Pt denies any alteration in taste or smell.  Pt denies any recent travel in the past 2 weeks or any known covid exposure.   In ED,  T 98.7  P 87-101  R 24  Bp 106/79  Pox 88% initially per ER, and currently 95%  CXR negative for acute process.   Wbc 16.9, Hgb 14.3, Plt 305  Na 138, K 4.1 Bun 14, Creatinine 1.05 Glucose 134  Pt tx with apparently magnesium 2gm iv x1 , along with albuterol HFA 2puff x1, and solumedrol 185m iv x1.    Pt still feeling sob and wheezing and will be admitted for Copd exacerbation along with low risk covid r/o due to c/o subjective fever, sore throat, myalgia.     Review of systems:    In addition to the HPI above,    No Headache, No changes with Vision or hearing, No problems swallowing food or Liquids, No Chest pain, No Abdominal pain, No Nausea or Vomiting, bowel movements are regular, No Blood in stool or Urine, No dysuria, No new skin rashes or bruises, No new joints pains-aches,  No new weakness, tingling, numbness in any extremity, No recent weight gain or loss, No polyuria, polydypsia or polyphagia, No significant Mental  Stressors.  All other systems reviewed and are negative.    Past History of the following :    Past Medical History:  Diagnosis Date  . Arthritis   . Asthma   . Cancer ([place]   . COPD (chronic obstructive pulmonary disease) ([place]   . Depression   . Diabetes mellitus without complication ([place]   . H/O blood clots   . PAD (peripheral artery disease) ([place]   . Thyroid disease       Past Surgical History:  Procedure Laterality Date  . Bypass right leg  2015  . CHOLECYSTECTOMY  1981  . Mesh implant right leg  2015  . THYROIDECTOMY  2013  . TUBAL LIGATION  1993      Social History:      Social History   Tobacco Use  . Smoking status: Current Every Day Smoker  . Smokeless tobacco: Never Used  Substance Use Topics  . Alcohol use: Not Currently  Family History :     Family History  Problem Relation Age of Onset  . Heart failure Mother   . Depression Mother   . Peripheral Artery Disease Sister   . Cancer Sister   . Clotting disorder Sister   . Depression Sister   . Thyroid disease Sister        Home Medications:   Prior to Admission medications   Medication Sig Start Date End Date Taking? Authorizing Provider  atorvastatin (LIPITOR) 40 MG tablet Take 40 mg by mouth daily.   Yes [provider]  clopidogrel (PLAVIX) 75 MG tablet Take 75 mg by mouth daily.   Yes [provider]  metFORMIN (GLUCOPHAGE) 500 MG tablet Take 500 mg by mouth 2 (two) times daily with a meal.   Yes [provider]  pregabalin (LYRICA) 75 MG capsule Take 75 mg by mouth 3 (three) times daily.   Yes [provider]  topiramate (TOPAMAX) 50 MG tablet Take 50 mg by mouth 2 (two) times daily.   Yes [provider]  DULoxetine (CYMBALTA) 60 MG capsule Take 60 mg by mouth daily.    [provider]  levothyroxine (SYNTHROID, LEVOTHROID) 125 MCG tablet Take 125 mcg by mouth 2 (two) times daily.    [provider]  meloxicam  (MOBIC) 15 MG tablet Take 15 mg by mouth daily.    [provider]     Allergies:     Allergies  Allergen Reactions  . Iodine      Physical Exam:   Vitals  Blood pressure 106/79, pulse 87, temperature 98.7 F (37.1 C), temperature source Oral, resp. rate (!) 24, height 5' 3"  (1.6 m), weight 80.7 kg, SpO2 95 %.  1.  General: Axox3,   2. Psychiatric: euthymic  3. Neurologic: cn2-12 intact, reflexes 2+ symmetric, diffuse with downgoing toes bilaterally, motor 5/5 in all 4 ext, pinprik intact  4. HEENMT:  No conjuntivitis, anicteric, pupils 1.55m symmetric, direct, consensual, near intact  5. Respiratory : + diffuse exp wheezing, no crackles.   6. Cardiovascular : rrr s1, s2, no m/g/r  7. Gastrointestinal:  Abd: soft, obese, nt, nd, +bs  8. Skin:  No c/c/e, normal turgor  9.Musculoskeletal:  Good ROM    Data Review:    CBC Recent Labs  Lab 04/20/18 2214  WBC 16.9*  HGB 14.3  HCT 43.1  PLT 305  MCV 96.0  MCH 31.8  MCHC 33.2  RDW 13.9  LYMPHSABS 4.0  MONOABS 1.0  EOSABS 1.2*  BASOSABS 0.1   ------------------------------------------------------------------------------------------------------------------  Results for orders placed or performed during the hospital encounter of 04/20/18 (from the past 48 hour(s))  Basic metabolic panel     Status: Abnormal   Collection Time: 04/20/18 10:14 PM  Result Value Ref Range   Sodium 138 135 - 145 mmol/L   Potassium 4.1 3.5 - 5.1 mmol/L   Chloride 102 98 - 111 mmol/L   CO2 25 22 - 32 mmol/L   Glucose, Bld 134 (H) 70 - 99 mg/dL   BUN 14 6 - 20 mg/dL   Creatinine, Ser 1.05 (H) 0.44 - 1.00 mg/dL   Calcium 8.9 8.9 - 10.3 mg/dL   GFR calc non Af Amer 59 (L) >60 mL/min   GFR calc Af Amer >60 >60 mL/min   Anion gap 11 5 - 15    Comment: [facility] [address], [place] [address] CBC with Differential/Platelet     Status: Abnormal  Collection Time: 04/20/18 10:14 PM   Result Value Ref Range   WBC 16.9 (H) 4.0 - 10.5 K/uL   RBC 4.49 3.87 - 5.11 MIL/uL   Hemoglobin 14.3 12.0 - 15.0 g/dL   HCT 43.1 36.0 - 46.0 %   MCV 96.0 80.0 - 100.0 fL   MCH 31.8 26.0 - 34.0 pg   MCHC 33.2 30.0 - 36.0 g/dL   RDW 13.9 11.5 - 15.5 %   Platelets 305 150 - 400 K/uL   nRBC 0.0 0.0 - 0.2 %   Neutrophils Relative % 63 %   Neutro Abs 10.6 (H) 1.7 - 7.7 K/uL   Lymphocytes Relative 24 %   Lymphs Abs 4.0 0.7 - 4.0 K/uL   Monocytes Relative 6 %   Monocytes Absolute 1.0 0.1 - 1.0 K/uL   Eosinophils Relative 7 %   Eosinophils Absolute 1.2 (H) 0.0 - 0.5 K/uL   Basophils Relative 0 %   Basophils Absolute 0.1 0.0 - 0.1 K/uL   Immature Granulocytes 0 %   Abs Immature Granulocytes 0.07 0.00 - 0.07 K/uL    Comment: Performed at [facility], [address] [place], [place] [address]    Chemistries  Recent Labs  Lab 04/20/18 2214  NA 138  K 4.1  CL 102  CO2 25  GLUCOSE 134*  BUN 14  CREATININE 1.05*  CALCIUM 8.9   ------------------------------------------------------------------------------------------------------------------  ------------------------------------------------------------------------------------------------------------------ GFR: Estimated Creatinine Clearance: 60.2 mL/min (A) (by C-G formula based on SCr of 1.05 mg/dL (H)). Liver Function Tests: No results for input(s): AST, ALT, ALKPHOS, BILITOT, PROT, ALBUMIN in the last 168 hours. No results for input(s): LIPASE, AMYLASE in the last 168 hours. No results for input(s): AMMONIA in the last 168 hours. Coagulation Profile: No results for input(s): INR, PROTIME in the last 168 hours. Cardiac Enzymes: No results for input(s): CKTOTAL, CKMB, CKMBINDEX, TROPONINI in the last 168 hours. BNP (last 3 results) No results for input(s): PROBNP in the last 8760 hours. HbA1C: No results for input(s): HGBA1C in the last 72 hours. CBG: No results for input(s): GLUCAP in the last 168 hours. Lipid Profile:  No results for input(s): CHOL, HDL, LDLCALC, TRIG, CHOLHDL, LDLDIRECT in the last 72 hours. Thyroid Function Tests: No results for input(s): TSH, T4TOTAL, FREET4, T3FREE, THYROIDAB in the last 72 hours. Anemia Panel: No results for input(s): VITAMINB12, FOLATE, FERRITIN, TIBC, IRON, RETICCTPCT in the last 72 hours.  --------------------------------------------------------------------------------------------------------------- Urine analysis: No results found for: COLORURINE, APPEARANCEUR, LABSPEC, PHURINE, GLUCOSEU, HGBUR, BILIRUBINUR, KETONESUR, PROTEINUR, UROBILINOGEN, NITRITE, LEUKOCYTESUR    Imaging Results:    Dg Chest Port 1 View  Result Date: 04/20/2018 CLINICAL DATA:  Cough and wheezing EXAM: PORTABLE CHEST 1 VIEW COMPARISON:  02/09/2010 FINDINGS: Cardiac shadows within normal limits. The lungs are well aerated bilaterally. No focal infiltrate or sizable effusion is seen. No acute bony abnormality is noted. IMPRESSION: No acute abnormality noted. Electronically Signed   By: Inez Catalina M.D.   On: 04/20/2018 22:54   ST at 104, nl axis, nl int, poor R progression , PVC   Assessment & Plan:    Principal Problem:   COPD exacerbation ([place]) Active Problems:   Suspected Covid-19 Virus Infection   Nicotine dependence   Type 2 diabetes mellitus with diabetic peripheral angiopathy without gangrene (HCC)   PAD (peripheral artery disease) (HCC)  Copd exacerbation Solumedrol 47m iv q8h zithromax 5085miv qday incruse 1puff qday Albuterol HFA 2puff qid, and q6h prn   Coronavirus r/o due to symptoms of fever,  sore throat, myalgia, cough, dyspnea, pox 88% on RA per ED Influenza testing Respiratory virus panel Coronavirus testing Check LFT, cpr, ESR, Cpk, D dimer, IL-6, LDH No Meloxicam or NSAIDS for now  Tachycardia ? Secondary to albuterol Tele Check TSH Awaiting d dimer, if positive CTA chest r/o PE Consider cardiac echo if persistent   Dm2 fsbs qc and qhs, ISS Cont  Metformin 568m po bid  PAD Cont Plavix 760mpo qday Add aspirin 8165mo qday Cont Lipitor 70m38m qhs Cont Lyrica 75mg20mtid  H/o migraines Cont topiramate  Hypothyroidism Check TSH Continue levothyroxine  Nicotine dependence Pt counselled on smoking cessation x 3minu34m Nicotine patch   DVT Prophylaxis-   Lovenox - SCDs   AM Labs Ordered, also please review Full Orders  Family Communication: Admission, patients condition and plan of care including tests being ordered have been discussed with the patient  who indicate understanding and agree with the plan and Code Status.  Code Status:   FULL CODE  Admission status:   Inpatient: Based on patients clinical presentation and evaluation of above clinical data, I have made determination that patient meets Inpatient criteria at this time.  Pt has hypoxia from Copd exacerbation and has enough risk factors to be ruled out for covid, pt will require iv solumedrol as well as abx, and therefore will require inpatient stay  Time spent in minutes :  70   Jeri Rawlins Jani Graveln 04/21/2018 at 12:07 AM

## 2018-04-22 DIAGNOSIS — F17208 Nicotine dependence, unspecified, with other nicotine-induced disorders: Secondary | ICD-10-CM

## 2018-04-22 DIAGNOSIS — J9601 Acute respiratory failure with hypoxia: Secondary | ICD-10-CM

## 2018-04-22 LAB — COMPREHENSIVE METABOLIC PANEL
ALT: 19 U/L (ref 0–44)
AST: 14 U/L — ABNORMAL LOW (ref 15–41)
Albumin: 3.3 g/dL — ABNORMAL LOW (ref 3.5–5.0)
Alkaline Phosphatase: 91 U/L (ref 38–126)
Anion gap: 10 (ref 5–15)
BUN: 23 mg/dL — ABNORMAL HIGH (ref 6–20)
CO2: 21 mmol/L — ABNORMAL LOW (ref 22–32)
Calcium: 9 mg/dL (ref 8.9–10.3)
Chloride: 104 mmol/L (ref 98–111)
Creatinine, Ser: 1.11 mg/dL — ABNORMAL HIGH (ref 0.44–1.00)
GFR calc Af Amer: >60 mL/min (ref >60)
GFR calc non Af Amer: 55 mL/min — ABNORMAL LOW (ref >60)
Glucose, Bld: 267 mg/dL — ABNORMAL HIGH (ref 70–99)
Potassium: 4.2 mmol/L (ref 3.5–5.1)
Sodium: 135 mmol/L (ref 135–145)
Total Bilirubin: 0.6 mg/dL (ref 0.3–1.2)
Total Protein: 6.8 g/dL (ref 6.5–8.1)

## 2018-04-22 LAB — CBC
HCT: 39.5 % (ref 36.0–46.0)
Hemoglobin: 12.7 g/dL (ref 12.0–15.0)
MCH: 31.4 pg (ref 26.0–34.0)
MCHC: 32.2 g/dL (ref 30.0–36.0)
MCV: 97.5 fL (ref 80.0–100.0)
Platelets: 264 10*3/uL (ref 150–400)
RBC: 4.05 MIL/uL (ref 3.87–5.11)
RDW: 13.8 % (ref 11.5–15.5)
WBC: 15.6 10*3/uL — ABNORMAL HIGH (ref 4.0–10.5)
nRBC: 0 % (ref 0.0–0.2)

## 2018-04-22 LAB — GLUCOSE, CAPILLARY
Glucose-Capillary: 275 mg/dL — ABNORMAL HIGH (ref 70–99)
Glucose-Capillary: 196 mg/dL — ABNORMAL HIGH (ref 70–99)
Glucose-Capillary: 252 mg/dL — ABNORMAL HIGH (ref 70–99)
Glucose-Capillary: 301 mg/dL — ABNORMAL HIGH (ref 70–99)

## 2018-04-22 LAB — HEMOGLOBIN A1C
Hgb A1c MFr Bld: 7.6 % — ABNORMAL HIGH (ref 4.8–5.6)
Mean Plasma Glucose: 171 mg/dL

## 2018-04-22 LAB — EXPECTORATED SPUTUM ASSESSMENT W REFEX TO RESP CULTURE

## 2018-04-22 LAB — INTERLEUKIN-6, PLASMA: Interleukin-6, Plasma: 8 pg/mL (ref 0.0–12.2)

## 2018-04-22 LAB — EXPECTORATED SPUTUM ASSESSMENT W GRAM STAIN, RFLX TO RESP C: Special Requests: NORMAL

## 2018-04-22 LAB — MAGNESIUM: Magnesium: 2.1 mg/dL (ref 1.7–2.4)

## 2018-04-22 MED ORDER — GUAIFENESIN-DM 100-10 MG/5ML PO SYRP
5.0000 mL | ORAL_SOLUTION | ORAL | Status: DC | PRN
  Administered 2018-04-22 – 2018-04-23 (×2): 5 mL via ORAL
  Filled 2018-04-22 (×3): qty 10

## 2018-04-22 MED ORDER — BUDESONIDE 0.5 MG/2ML IN SUSP
0.5000 mg | Freq: Two times a day (BID) | RESPIRATORY_TRACT | Status: DC
  Administered 2018-04-22 – 2018-04-23 (×3): 0.5 mg via RESPIRATORY_TRACT
  Filled 2018-04-22 (×3): qty 2

## 2018-04-22 MED ORDER — ALBUTEROL SULFATE HFA 108 (90 BASE) MCG/ACT IN AERS
2.0000 | INHALATION_SPRAY | Freq: Three times a day (TID) | RESPIRATORY_TRACT | Status: DC
  Administered 2018-04-23: 2 via RESPIRATORY_TRACT

## 2018-04-22 MED ORDER — METHYLPREDNISOLONE SODIUM SUCC 40 MG IJ SOLR
40.0000 mg | Freq: Three times a day (TID) | INTRAMUSCULAR | Status: DC
  Administered 2018-04-22 – 2018-04-23 (×3): 40 mg via INTRAVENOUS
  Filled 2018-04-22 (×3): qty 1

## 2018-04-22 MED ORDER — ALBUTEROL SULFATE HFA 108 (90 BASE) MCG/ACT IN AERS
2.0000 | INHALATION_SPRAY | RESPIRATORY_TRACT | Status: DC | PRN
  Administered 2018-04-22: 2 via RESPIRATORY_TRACT

## 2018-04-22 NOTE — Progress Notes (Signed)
PROGRESS NOTE    Shannon Russell  BJY:[id number] DOB: 02-02-1960 DOA: 04/20/2018 PCP: Tollie Eth, NP    Brief Narrative:  58 year old female who presented with dyspnea.  She does have significant past medical history for asthma and COPD, tobacco abuse, hypothyroidism, peripheral artery disease and migraines.  Reported about 10 days of dyspnea, associated with cough which have been productive, associated with subjective fevers and myalgias.  Her symptoms were refractive to outpatient therapy with azithromycin.  On her initial physical examination her oxygen saturation was 88% on room air, temperature 98.7, pulse rate 87, respiratory rate 24, blood pressure 106/79.  Her lungs had diffuse expiratory wheezing, no rales, heart S1-S2 present rhythmic, abdomen was soft, no lower extremity edema.  Sodium 138, potassium 4.1, chloride 102, bicarb 25, glucose 134, BUN 14, creatinine 1.0, white count 16.9, hemoglobin 14.3, hematocrit 43.1, platelets 305.  Influenza A/B negative, respiratory panel negative, SARS COVID-19 negative.  Chest x-ray with hyperinflation, negative for infiltrates.  EKG 104 bpm, normal intervals, normal axis, sinus rhythm with normal conduction, positive PVCs.  Patient was admitted to the hospital working diagnosis of acute COPD exacerbation  Assessment & Plan:   Principal Problem:   COPD exacerbation ([place]) Active Problems:   Suspected Covid-19 Virus Infection   Nicotine dependence   Type 2 diabetes mellitus with diabetic peripheral angiopathy without gangrene ([place])   PAD (peripheral artery disease) ([place])   1. Acute hypoxic respiratory failure due to acute COPD exacerbation. Patient continue to improve symptoms but not yet back to baseline, continue to have wheezing and dyspnea. Will continue aggressive bronchodilator therapy with albuterol and Ellipta, will add inhaled corticosteroids and will decrease systemic steroids to 40 mg IV q12. Out of bed as tolerated. Discontinue  azithromycin for now. Patient has been tested negative for respiratory virus, including Influenza and COVID 19.   2. T2DM. Will continue glucose cover and monitoring with insulin sliding scale, basal insulin with 12 units of glargine. Fasting glucose this am 267, capillary 275 and 196. Tolerating po well.   3. Tobacco abuse. Smoking cessation counseling, continue nicotine patch.   4. Obesity with dyslipidemia. Calculated BMI is 31. Will outpatient follow up. Continue atorvastatin.   5. Peripheral artery disease. Continue clopidogrel and atorvastatin.  6. Hypothyroid. Continue levothyroxine.    DVT prophylaxis: enoxaparin   Code Status:  full Family Communication: no family at the bedside  Disposition Plan/ discharge barriers: pending clinical improvement.   Body mass index is 31.53 kg/m. Malnutrition Type:      Malnutrition Characteristics:      Nutrition Interventions:     RN Pressure Injury Documentation:     Consultants:     Procedures:     Antimicrobials:       Subjective: Patient reports improving dyspnea, but not yet back to baseline, continue wheezing, no chest pain, no worsening cough. No nausea or vomiting. No chest pain.   Objective: Vitals:   04/21/18 1430 04/21/18 1800 04/21/18 2044 04/22/18 0529  BP: 118/77  122/64 124/70  Pulse: 89  100 95  Resp: 18  18 18   Temp: 98.6 F (37 C)  97.8 F (36.6 C) 97.8 F (36.6 C)  TempSrc: Oral  Oral Oral  SpO2: 94% 95% 93% 95%  Weight:      Height:        Intake/Output Summary (Last 24 hours) at 04/22/2018 0948 Last data filed at 04/22/2018 0100 Gross per 24 hour  Intake 900 ml  Output -  Net 900 ml  Filed Weights   04/20/18 2150  Weight: 80.7 kg    Examination:   General: deconditioned  Neurology: Awake and alert, non focal  E ENT: mild pallor, no icterus, oral mucosa moist Cardiovascular: No JVD. S1-S2 present, rhythmic, no gallops, rubs, or murmurs. No lower extremity edema.  Pulmonary: positive breath sounds bilaterally, decreased air movement, positive inspiratory wheezing bilaterally, with no rhonchi or rales. Gastrointestinal. Abdomen with no organomegaly, non tender, no rebound or guarding Skin. No rashes Musculoskeletal: no joint deformities     Data Reviewed: I have personally reviewed following labs and imaging studies  CBC: Recent Labs  Lab 04/20/18 2214 04/21/18 0502 04/22/18 0324  WBC 16.9* 11.9* 15.6*  NEUTROABS 10.6* 10.4*  --   HGB 14.3 13.9 12.7  HCT 43.1 43.9 39.5  MCV 96.0 97.3 97.5  PLT 305 279 944   Basic Metabolic Panel: Recent Labs  Lab 04/20/18 2214 04/21/18 0502 04/22/18 0324  NA 138 137 135  K 4.1 4.7 4.2  CL 102 104 104  CO2 25 24 21*  GLUCOSE 134* 266* 267*  BUN 14 14 23*  CREATININE 1.05* 1.13* 1.11*  CALCIUM 8.9 9.0 9.0  MG  --   --  2.1   GFR: Estimated Creatinine Clearance: 56.9 mL/min (A) (by C-G formula based on SCr of 1.11 mg/dL (H)). Liver Function Tests: Recent Labs  Lab 04/21/18 0502 04/22/18 0324  AST 16  14* 14*  ALT 22  22 19   ALKPHOS 109  115 91  BILITOT 0.2*  0.5 0.6  PROT 7.2  7.6 6.8  ALBUMIN 3.6  3.8 3.3*   No results for input(s): LIPASE, AMYLASE in the last 168 hours. No results for input(s): AMMONIA in the last 168 hours. Coagulation Profile: No results for input(s): INR, PROTIME in the last 168 hours. Cardiac Enzymes: Recent Labs  Lab 04/21/18 0502  TROPONINI <0.03   BNP (last 3 results) No results for input(s): PROBNP in the last 8760 hours. HbA1C: Recent Labs    04/20/18 2214  HGBA1C 7.6*   CBG: Recent Labs  Lab 04/21/18 0753 04/21/18 1132 04/21/18 1656 04/21/18 2042 04/22/18 0732  GLUCAP 261* 255* 269* 408* 275*   Lipid Profile: No results for input(s): CHOL, HDL, LDLCALC, TRIG, CHOLHDL, LDLDIRECT in the last 72 hours. Thyroid Function Tests: No results for input(s): TSH, T4TOTAL, FREET4, T3FREE, THYROIDAB in the last 72 hours. Anemia Panel:  Recent Labs    04/21/18 0502  FERRITIN 111      Radiology Studies: I have reviewed all of the imaging during this hospital visit personally     Scheduled Meds: . albuterol  2 puff Inhalation QID  . aspirin EC  81 mg Oral Daily  . atorvastatin  40 mg Oral Daily  . clopidogrel  75 mg Oral QAC breakfast  . insulin aspart  0-5 Units Subcutaneous QHS  . insulin aspart  0-9 Units Subcutaneous TID WC  . insulin glargine  12 Units Subcutaneous QHS  . levothyroxine  125 mcg Oral Q0600  . metFORMIN  500 mg Oral BID WC  . methylPREDNISolone (SOLU-MEDROL) injection  80 mg Intravenous Q8H  . nicotine  21 mg Transdermal Daily  . pregabalin  75 mg Oral TID  . sodium chloride flush  3 mL Intravenous Q12H  . umeclidinium bromide  1 puff Inhalation Daily   Continuous Infusions: . sodium chloride    . azithromycin 500 mg (04/22/18 0922)     LOS: 2 days  Ziyan Hillmer Gerome Apley, MD

## 2018-04-22 NOTE — Progress Notes (Signed)
RT educated patient on use of spacer with inhalers. Patient demonstrated understanding and good effort and technique. Patient encouraged to use with medications at home.

## 2018-04-22 NOTE — Progress Notes (Signed)
Inpatient Diabetes Program Recommendations  AACE/ADA: New Consensus Statement on Inpatient Glycemic Control (2015)  Target Ranges:  Prepandial:   less than 140 mg/dL      Peak postprandial:   less than 180 mg/dL (1-2 hours)      Critically ill patients:  140 - 180 mg/dL   Lab Results  Component Value Date   GLUCAP 275 (H) 04/22/2018   HGBA1C 7.6 (H) 04/20/2018    Review of Glycemic Control  FBS this am - 267, 275 Post-prandial up to 408 last night. High dose steroids continue. Needs tighter control.  Inpatient Diabetes Program Recommendations:    Increase Lantus to 16 units QHS Add Meal Coverage insulin - Novolog 5 units tidwc (while on steroids)  Will follow daily.  Thank you. Lorenda Peck, RD, LDN, CDE Inpatient Diabetes Coordinator [phone]

## 2018-04-23 LAB — GLUCOSE, CAPILLARY
Glucose-Capillary: 197 mg/dL — ABNORMAL HIGH (ref 70–99)
Glucose-Capillary: 216 mg/dL — ABNORMAL HIGH (ref 70–99)

## 2018-04-23 LAB — MAGNESIUM: Magnesium: 2.1 mg/dL (ref 1.7–2.4)

## 2018-04-23 MED ORDER — PREDNISONE 10 MG PO TABS: ORAL_TABLET | ORAL | 0 refills | Status: AC

## 2018-04-23 NOTE — Discharge Summary (Signed)
Physician Discharge Summary  Venisha Boehning ZOX:[id number] DOB: April 30, 1960 DOA: 04/20/2018  PCP: Tollie Eth, NP  Admit date: 04/20/2018 Discharge date: 04/23/2018  Admitted From: Home Disposition: Home  Recommendations for Outpatient Follow-up:  1. Follow up with PCP in 1 week 2. Please obtain BMP/CBC in one week 3. Please follow up on the following pending results: None  Home Health: None Equipment/Devices: Spacer  Discharge Condition: Stable CODE STATUS: Full code Diet recommendation: Heart healthy/carb modified   Brief/Interim Summary:  Admission HPI written by Jani Gravel, MD    HPI:   Cyani Kallstrom  is a 58 y.o. female, w Asthma/ Copd, Tobacco abuse, PAD, Hypothyroidism, Migraines, apparently c/o dyspnea for the past 1.5 weeks, along with cough, initially productive yellow, now white, sore throat, subjective fever, and myalgia.  Pt states she was seen by her PCP and tx with zithromax without improvement.  She has also received levaquin in the the past 2 months.  Pt presented tonight due to increase in dyspnea and wheezing.  Pt denies any alteration in taste or smell.  Pt denies any recent travel in the past 2 weeks or any known covid exposure.   In ED,  T 98.7  P 87-101  R 24  Bp 106/79  Pox 88% initially per ER, and currently 95%  CXR negative for acute process.   Wbc 16.9, Hgb 14.3, Plt 305  Na 138, K 4.1 Bun 14, Creatinine 1.05 Glucose 134  Pt tx with apparently magnesium 2gm iv x1 , along with albuterol HFA 2puff x1, and solumedrol 125mg  iv x1.    Pt still feeling sob and wheezing and will be admitted for Copd exacerbation along with low risk covid r/o due to c/o subjective fever, sore throat, myalgia.    Hospital course:  Acute respiratory failure with hypoxia Secondary to COPD exacerbation. Resolved with treatment of COPD exacerbation. On room air. Tested for SARS-Cov-2  COPD exacerbation Treated with IV steroids and albuterol. Initially  given azithromycin which was discontinued.   Diabetes mellitus Type 2. Uncontrolled with hyperglycemia. Worse with IV steroids. Continue home metformin  Obesity Body mass index is 31.53 kg/m.   Hyperlipidemia Continued Lipitor  PAD Patient follows with Vascualr surgery at OSH. Continued Plavix.  Hypothyroidism Continued Synthroid  Discharge Diagnoses:  Principal Problem:   COPD exacerbation ([place]) Active Problems:   Suspected Covid-19 Virus Infection   Nicotine dependence   Type 2 diabetes mellitus with diabetic peripheral angiopathy without gangrene ([place])   PAD (peripheral artery disease) ([place])    Discharge Instructions  Discharge Instructions    Diet - low sodium heart healthy   Complete by:  As directed    Increase activity slowly   Complete by:  As directed      Allergies as of 04/23/2018      Reactions   Advair Diskus [fluticasone-salmeterol] Hives   Iodine Rash      Medication List    TAKE these medications   albuterol 108 (90 Base) MCG/ACT inhaler Commonly known as:  PROVENTIL HFA;VENTOLIN HFA Inhale 2 puffs into the lungs every 6 (six) hours as needed for wheezing.   albuterol (2.5 MG/3ML) 0.083% NEBU 3 mL, albuterol (5 MG/ML) 0.5% NEBU 0.5 mL Inhale 2.5 mg into the lungs every 4 (four) hours as needed (wheezing, SOB).   atorvastatin 40 MG tablet Commonly known as:  LIPITOR Take 40 mg by mouth daily.   budesonide 0.5 MG/2ML nebulizer solution Commonly known as:  PULMICORT Take 0.5 mg by nebulization 2 (  two) times daily.   clopidogrel 75 MG tablet Commonly known as:  PLAVIX Take 75 mg by mouth daily.   levothyroxine 137 MCG tablet Commonly known as:  SYNTHROID, LEVOTHROID Take 137 mcg by mouth daily before breakfast.   meloxicam 15 MG tablet Commonly known as:  MOBIC Take 15 mg by mouth daily.   metFORMIN 1000 MG tablet Commonly known as:  GLUCOPHAGE Take 1,000 mg by mouth daily with supper.   predniSONE 10 MG tablet Commonly known  as:  DELTASONE Take 4 tablets (40 mg total) by mouth daily with breakfast for 2 days, THEN 2 tablets (20 mg total) daily with breakfast for 2 days, THEN 1 tablet (10 mg total) daily with breakfast for 4 days. Start taking on:  April 24, 2018   pregabalin 75 MG capsule Commonly known as:  LYRICA Take 75 mg by mouth 3 (three) times daily.   propranolol 40 MG tablet Commonly known as:  INDERAL Take 40 mg by mouth 2 (two) times daily.   VITAMIN D (ERGOCALCIFEROL) PO Take 1,000 Units by mouth 2 (two) times daily.      Follow-up Information    Tollie Eth, NP. Schedule an appointment as soon as possible for a visit in 1 week(s).   Specialty:  [facility] Why:  Follow up with your Primary Care Provider as soon as possible. Thanks!  Contact information: [place] [address] [phone]          Allergies  Allergen Reactions  . Advair Diskus [Fluticasone-Salmeterol] Hives  . Iodine Rash    Consultations:  None   Procedures/Studies: Dg Chest Port 1 View  Result Date: 04/20/2018 CLINICAL DATA:  Cough and wheezing EXAM: PORTABLE CHEST 1 VIEW COMPARISON:  02/09/2010 FINDINGS: Cardiac shadows within normal limits. The lungs are well aerated bilaterally. No focal infiltrate or sizable effusion is seen. No acute bony abnormality is noted. IMPRESSION: No acute abnormality noted. Electronically Signed   By: Inez Catalina M.D.   On: 04/20/2018 22:54      Subjective: Mild cough. Breathing improved. No chest pain.  Discharge Exam: Vitals:   04/23/18 0500 04/23/18 0746  BP: 135/88   Pulse: 84   Resp: 16   Temp: 97.8 F (36.6 C)   SpO2: 95% 95%   Vitals:   04/22/18 1951 04/22/18 2006 04/23/18 0500 04/23/18 0746  BP:  125/73 135/88   Pulse:  98 84   Resp:  18 16   Temp:  97.7 F (36.5 C) 97.8 F (36.6 C)   TempSrc:  Oral Oral   SpO2: 99% 94% 95% 95%  Weight:      Height:        General: Pt is alert, awake, not in acute distress  Cardiovascular: RRR, S1/S2 +, no rubs, no gallops Respiratory: CTA bilaterally, no wheezing, no rhonchi Abdominal: Soft, NT, ND, bowel sounds + Extremities: no edema, no cyanosis    The results of significant diagnostics from this hospitalization (including imaging, microbiology, ancillary and laboratory) are listed below for reference.     Microbiology: Recent Results (from the past 240 hour(s))  Respiratory Panel by PCR     Status: None   Collection Time: 04/21/18  3:42 AM  Result Value Ref Range Status   Adenovirus NOT DETECTED NOT DETECTED Final   Coronavirus 229E NOT DETECTED NOT DETECTED Final    Comment: (NOTE) The Coronavirus on the Respiratory Panel, DOES NOT test for the novel  Coronavirus (2019 nCoV)    Coronavirus  HKU1 NOT DETECTED NOT DETECTED Final   Coronavirus NL63 NOT DETECTED NOT DETECTED Final   Coronavirus OC43 NOT DETECTED NOT DETECTED Final   Metapneumovirus NOT DETECTED NOT DETECTED Final   Rhinovirus / Enterovirus NOT DETECTED NOT DETECTED Final   Influenza A NOT DETECTED NOT DETECTED Final   Influenza B NOT DETECTED NOT DETECTED Final   Parainfluenza Virus 1 NOT DETECTED NOT DETECTED Final   Parainfluenza Virus 2 NOT DETECTED NOT DETECTED Final   Parainfluenza Virus 3 NOT DETECTED NOT DETECTED Final   Parainfluenza Virus 4 NOT DETECTED NOT DETECTED Final   Respiratory Syncytial Virus NOT DETECTED NOT DETECTED Final   Bordetella pertussis NOT DETECTED NOT DETECTED Final   Chlamydophila pneumoniae NOT DETECTED NOT DETECTED Final   Mycoplasma pneumoniae NOT DETECTED NOT DETECTED Final    Comment: Performed at [facility], [place] [address] [place], [place] [address]  SARS Coronavirus 2 [facility] order, Performed in [facility] hospital lab)     Status: None   Collection Time: 04/21/18  3:42 AM  Result Value Ref Range Status   SARS Coronavirus 2 NEGATIVE NEGATIVE Final    Comment: (NOTE) If result is NEGATIVE SARS-CoV-2 target nucleic acids are  NOT DETECTED. The SARS-CoV-2 RNA is generally detectable in upper and lower  respiratory specimens during the acute phase of infection. The lowest  concentration of SARS-CoV-2 viral copies this assay can detect is 250  copies / mL. A negative result does not preclude SARS-CoV-2 infection  and should not be used as the sole basis for treatment or other  patient management decisions.  A negative result may occur with  improper specimen collection / handling, submission of specimen other  than nasopharyngeal swab, presence of viral mutation(s) within the  areas targeted by this assay, and inadequate number of viral copies  (<250 copies / mL). A negative result must be combined with clinical  observations, patient history, and epidemiological information. If result is POSITIVE SARS-CoV-2 target nucleic acids are DETECTED. The SARS-CoV-2 RNA is generally detectable in upper and lower  respiratory specimens dur ing the acute phase of infection.  Positive  results are indicative of active infection with SARS-CoV-2.  Clinical  correlation with patient history and other diagnostic information is  necessary to determine patient infection status.  Positive results do  not rule out bacterial infection or co-infection with other viruses. If result is PRESUMPTIVE POSTIVE SARS-CoV-2 nucleic acids MAY BE PRESENT.   A presumptive positive result was obtained on the submitted specimen  and confirmed on repeat testing.  While 2019 novel coronavirus  (SARS-CoV-2) nucleic acids may be present in the submitted sample  additional confirmatory testing may be necessary for epidemiological  and / or clinical management purposes  to differentiate between  SARS-CoV-2 and other Sarbecovirus currently known to infect humans.  If clinically indicated additional testing with an alternate test  methodology [phone]) is advised. The SARS-CoV-2 RNA is generally  detectable in upper and lower respiratory sp ecimens  during the acute  phase of infection. The expected result is Negative. Fact Sheet for Patients:  StrictlyIdeas.no Fact Sheet for Healthcare Providers: BankingDealers.co.za This test is not yet approved or cleared by the [place] FDA and has been authorized for detection and/or diagnosis of SARS-CoV-2 by FDA under an Emergency Use Authorization (EUA).  This EUA will remain in effect (meaning this test can be used) for the duration of the COVID-19 declaration under Section 564(b)(1) of the Act, 21 U.S.C. section 360bbb-3(b)(1), unless the  authorization is terminated or revoked sooner. Performed at [facility], [place] [address] [place], [place] [address]   Culture, sputum-assessment     Status: None   Collection Time: 04/22/18  3:20 PM  Result Value Ref Range Status   Specimen Description SPU EXPECTORATED  Final   Special Requests Normal  Final   Sputum evaluation   Final    THIS SPECIMEN IS ACCEPTABLE FOR SPUTUM CULTURE Performed at [facility], [place] [address], [place], [place] [address]    Report Status 04/22/2018 FINAL  Final  Culture, respiratory     Status: None (Preliminary result)   Collection Time: 04/22/18  3:20 PM  Result Value Ref Range Status   Specimen Description   Final    SPU Performed at [facility], [place] [address], [place], [place] [address]    Special Requests   Final    Normal Reflexed from [id number] Performed at [facility], [place] [address], [place], [place] [address]    Gram Stain   Final    RARE WBC PRESENT, PREDOMINANTLY PMN FEW GRAM POSITIVE COCCI RARE GRAM NEGATIVE RODS    Culture   Final    CULTURE REINCUBATED FOR BETTER GROWTH Performed at [facility], [place] [address], [place], [place] [address]    Report Status PENDING  Incomplete     Labs: BNP (last 3 results) No results for input(s): BNP in the last 8760 hours. Basic Metabolic Panel:  Recent Labs  Lab 04/20/18 2214 04/21/18 0502 04/22/18 0324 04/23/18 0528  NA 138 137 135  --   K 4.1 4.7 4.2  --   CL 102 104 104  --   CO2 25 24 21*  --   GLUCOSE 134* 266* 267*  --   BUN 14 14 23*  --   CREATININE 1.05* 1.13* 1.11*  --   CALCIUM 8.9 9.0 9.0  --   MG  --   --  2.1 2.1   Liver Function Tests: Recent Labs  Lab 04/21/18 0502 04/22/18 0324  AST 16  14* 14*  ALT 22  22 19   ALKPHOS 109  115 91  BILITOT 0.2*  0.5 0.6  PROT 7.2  7.6 6.8  ALBUMIN 3.6  3.8 3.3*   No results for input(s): LIPASE, AMYLASE in the last 168 hours. No results for input(s): AMMONIA in the last 168 hours. CBC: Recent Labs  Lab 04/20/18 2214 04/21/18 0502 04/22/18 0324  WBC 16.9* 11.9* 15.6*  NEUTROABS 10.6* 10.4*  --   HGB 14.3 13.9 12.7  HCT 43.1 43.9 39.5  MCV 96.0 97.3 97.5  PLT 305 279 264   Cardiac Enzymes: Recent Labs  Lab 04/21/18 0502  TROPONINI <0.03   BNP: Invalid input(s): POCBNP CBG: Recent Labs  Lab 04/22/18 1151 04/22/18 1705 04/22/18 2001 04/23/18 0737 04/23/18 1123  GLUCAP 196* 252* 301* 197* 216*   D-Dimer Recent Labs    04/21/18 0502  DDIMER 0.44   Hgb A1c Recent Labs    04/20/18 2214  HGBA1C 7.6*   Lipid Profile No results for input(s): CHOL, HDL, LDLCALC, TRIG, CHOLHDL, LDLDIRECT in the last 72 hours. Thyroid function studies No results for input(s): TSH, T4TOTAL, T3FREE, THYROIDAB in the last 72 hours.  Invalid input(s): FREET3 Anemia work up [employer]    04/21/18 0502  FERRITIN 111   Urinalysis No results found for: COLORURINE, APPEARANCEUR, [place], [place], [place], [place], [place], [place], [place], [place], NITRITE, LEUKOCYTESUR Sepsis Labs Invalid  input(s): PROCALCITONIN,  WBC,  LACTICIDVEN Microbiology Recent Results (from the past 240 hour(s))  Respiratory Panel by PCR     Status: None   Collection Time: 04/21/18  3:42 AM  Result Value Ref Range Status   Adenovirus NOT DETECTED NOT DETECTED  Final   Coronavirus 229E NOT DETECTED NOT DETECTED Final    Comment: (NOTE) The Coronavirus on the Respiratory Panel, DOES NOT test for the novel  Coronavirus (2019 nCoV)    Coronavirus HKU1 NOT DETECTED NOT DETECTED Final   Coronavirus NL63 NOT DETECTED NOT DETECTED Final   Coronavirus OC43 NOT DETECTED NOT DETECTED Final   Metapneumovirus NOT DETECTED NOT DETECTED Final   Rhinovirus / Enterovirus NOT DETECTED NOT DETECTED Final   Influenza A NOT DETECTED NOT DETECTED Final   Influenza B NOT DETECTED NOT DETECTED Final   Parainfluenza Virus 1 NOT DETECTED NOT DETECTED Final   Parainfluenza Virus 2 NOT DETECTED NOT DETECTED Final   Parainfluenza Virus 3 NOT DETECTED NOT DETECTED Final   Parainfluenza Virus 4 NOT DETECTED NOT DETECTED Final   Respiratory Syncytial Virus NOT DETECTED NOT DETECTED Final   Bordetella pertussis NOT DETECTED NOT DETECTED Final   Chlamydophila pneumoniae NOT DETECTED NOT DETECTED Final   Mycoplasma pneumoniae NOT DETECTED NOT DETECTED Final    Comment: Performed at [facility], [address] [place], [place] [address]  SARS Coronavirus 2 [facility] order, Performed in [facility] hospital lab)     Status: None   Collection Time: 04/21/18  3:42 AM  Result Value Ref Range Status   SARS Coronavirus 2 NEGATIVE NEGATIVE Final    Comment: (NOTE) If result is NEGATIVE SARS-CoV-2 target nucleic acids are NOT DETECTED. The SARS-CoV-2 RNA is generally detectable in upper and lower  respiratory specimens during the acute phase of infection. The lowest  concentration of SARS-CoV-2 viral copies this assay can detect is 250  copies / mL. A negative result does not preclude SARS-CoV-2 infection  and should not be used as the sole basis for treatment or other  patient management decisions.  A negative result may occur with  improper specimen collection / handling, submission of specimen other  than nasopharyngeal swab, presence of viral mutation(s) within the   areas targeted by this assay, and inadequate number of viral copies  (<250 copies / mL). A negative result must be combined with clinical  observations, patient history, and epidemiological information. If result is POSITIVE SARS-CoV-2 target nucleic acids are DETECTED. The SARS-CoV-2 RNA is generally detectable in upper and lower  respiratory specimens dur ing the acute phase of infection.  Positive  results are indicative of active infection with SARS-CoV-2.  Clinical  correlation with patient history and other diagnostic information is  necessary to determine patient infection status.  Positive results do  not rule out bacterial infection or co-infection with other viruses. If result is PRESUMPTIVE POSTIVE SARS-CoV-2 nucleic acids MAY BE PRESENT.   A presumptive positive result was obtained on the submitted specimen  and confirmed on repeat testing.  While 2019 novel coronavirus  (SARS-CoV-2) nucleic acids may be present in the submitted sample  additional confirmatory testing may be necessary for epidemiological  and / or clinical management purposes  to differentiate between  SARS-CoV-2 and other Sarbecovirus currently known to infect humans.  If clinically indicated additional testing with an alternate test  methodology [phone]) is advised. The SARS-CoV-2 RNA is generally  detectable in upper and lower respiratory sp ecimens during the acute  phase of  infection. The expected result is Negative. Fact Sheet for Patients:  StrictlyIdeas.no Fact Sheet for Healthcare Providers: BankingDealers.co.za This test is not yet approved or cleared by the [place] FDA and has been authorized for detection and/or diagnosis of SARS-CoV-2 by FDA under an Emergency Use Authorization (EUA).  This EUA will remain in effect (meaning this test can be used) for the duration of the COVID-19 declaration under Section 564(b)(1) of the Act, 21 U.S.C.  section 360bbb-3(b)(1), unless the authorization is terminated or revoked sooner. Performed at [facility], [place] [address] [place], [place] [address]   Culture, sputum-assessment     Status: None   Collection Time: 04/22/18  3:20 PM  Result Value Ref Range Status   Specimen Description SPU EXPECTORATED  Final   Special Requests Normal  Final   Sputum evaluation   Final    THIS SPECIMEN IS ACCEPTABLE FOR SPUTUM CULTURE Performed at [facility], [place] [address] [place], [place] [address]    Report Status 04/22/2018 FINAL  Final  Culture, respiratory     Status: None (Preliminary result)   Collection Time: 04/22/18  3:20 PM  Result Value Ref Range Status   Specimen Description   Final    SPU Performed at [facility], [place] [address] [place], [place] [address]    Special Requests   Final    Normal Reflexed from [id number] Performed at [facility], [place] [address] [place], [place] [address]    Gram Stain   Final    RARE WBC PRESENT, PREDOMINANTLY PMN FEW GRAM POSITIVE COCCI RARE GRAM NEGATIVE RODS    Culture   Final    CULTURE REINCUBATED FOR BETTER GROWTH Performed at [facility], [place] [address], [place], [place] [address]    Report Status PENDING  Incomplete     SIGNED:   Cordelia Poche, MD [facility] 04/23/2018, 11:33 AM

## 2018-04-23 NOTE — Discharge Instructions (Addendum)
Shannon Russell,  You were in the hospital for a presumed COPD exacerbation. You were treated with IV steroids and inhaler treatments. Please continue the inhaler treatments. You will be discharged with a steroid taper. Please follow-up with your primary care physician.   Prednisone tablets What is this medicine? PREDNISONE (PRED ni sone) is a corticosteroid. It is commonly used to treat inflammation of the skin, joints, lungs, and other organs. Common conditions treated include asthma, allergies, and arthritis. It is also used for other conditions, such as blood disorders and diseases of the adrenal glands. This medicine may be used for other purposes; ask your health care provider or pharmacist if you have questions. COMMON BRAND NAME(S): Deltasone, Predone, Sterapred, Sterapred DS What should I tell my health care provider before I take this medicine? They need to know if you have any of these conditions: -Cushing's syndrome -diabetes -glaucoma -heart disease -high blood pressure -infection (especially a virus infection such as chickenpox, cold sores, or herpes) -kidney disease -liver disease -mental illness -myasthenia gravis -osteoporosis -seizures -stomach or intestine problems -thyroid disease -an unusual or allergic reaction to lactose, prednisone, other medicines, foods, dyes, or preservatives -pregnant or trying to get pregnant -breast-feeding How should I use this medicine? Take this medicine by mouth with a glass of water. Follow the directions on the prescription label. Take this medicine with food. If you are taking this medicine once a day, take it in the morning. Do not take more medicine than you are told to take. Do not suddenly stop taking your medicine because you may develop a severe reaction. Your doctor will tell you how much medicine to take. If your doctor wants you to stop the medicine, the dose may be slowly lowered over time to avoid any side effects. Talk to  your pediatrician regarding the use of this medicine in children. Special care may be needed. Overdosage: If you think you have taken too much of this medicine contact a poison control center or emergency room at once. NOTE: This medicine is only for you. Do not share this medicine with others. What if I miss a dose? If you miss a dose, take it as soon as you can. If it is almost time for your next dose, talk to your doctor or health care professional. You may need to miss a dose or take an extra dose. Do not take double or extra doses without advice. What may interact with this medicine? Do not take this medicine with any of the following medications: -metyrapone -mifepristone This medicine may also interact with the following medications: -aminoglutethimide -amphotericin B -aspirin and aspirin-like medicines -barbiturates -certain medicines for diabetes, like glipizide or glyburide -cholestyramine -cholinesterase inhibitors -cyclosporine -digoxin -diuretics -ephedrine -female hormones, like estrogens and birth control pills -isoniazid -ketoconazole -NSAIDS, medicines for pain and inflammation, like ibuprofen or naproxen -phenytoin -rifampin -toxoids -vaccines -warfarin This list may not describe all possible interactions. Give your health care provider a list of all the medicines, herbs, non-prescription drugs, or dietary supplements you use. Also tell them if you smoke, drink alcohol, or use illegal drugs. Some items may interact with your medicine. What should I watch for while using this medicine? Visit your doctor or health care professional for regular checks on your progress. If you are taking this medicine over a prolonged period, carry an identification card with your name and address, the type and dose of your medicine, and your doctor's name and address. This medicine may increase your risk of getting an  infection. Tell your doctor or health care professional if you are  around anyone with measles or chickenpox, or if you develop sores or blisters that do not heal properly. If you are going to have surgery, tell your doctor or health care professional that you have taken this medicine within the last twelve months. Ask your doctor or health care professional about your diet. You may need to lower the amount of salt you eat. This medicine may increase blood sugar. Ask your healthcare provider if changes in diet or medicines are needed if you have diabetes. What side effects may I notice from receiving this medicine? Side effects that you should report to your doctor or health care professional as soon as possible: -allergic reactions like skin rash, itching or hives, swelling of the face, lips, or tongue -changes in emotions or moods -changes in vision -depressed mood -eye pain -fever or chills, cough, sore throat, pain or difficulty passing urine -signs and symptoms of high blood sugar such as being more thirsty or hungry or having to urinate more than normal. You may also feel very tired or have blurry vision. -swelling of ankles, feet Side effects that usually do not require medical attention (report to your doctor or health care professional if they continue or are bothersome): -confusion, excitement, restlessness -headache -nausea, vomiting -skin problems, acne, thin and shiny skin -trouble sleeping -weight gain This list may not describe all possible side effects. Call your doctor for medical advice about side effects. You may report side effects to FDA at 1-800-FDA-1088. Where should I keep my medicine? Keep out of the reach of children. Store at room temperature between 15 and 30 degrees C (59 and 86 degrees F). Protect from light. Keep container tightly closed. Throw away any unused medicine after the expiration date. NOTE: This sheet is a summary. It may not cover all possible information. If you have questions about this medicine, talk to your  doctor, pharmacist, or health care provider.  2019 Elsevier/Gold Standard (2017-09-24 10:54:22)

## 2018-04-25 LAB — CULTURE, RESPIRATORY W GRAM STAIN
Culture: NORMAL
Special Requests: NORMAL

## 2019-01-20 ENCOUNTER — Other Ambulatory Visit: Payer: Self-pay

## 2019-01-20 ENCOUNTER — Emergency Department (HOSPITAL_COMMUNITY): Payer: Medicaid Other

## 2019-01-20 ENCOUNTER — Encounter (HOSPITAL_COMMUNITY): Payer: Self-pay | Admitting: Emergency Medicine

## 2019-01-20 ENCOUNTER — Emergency Department (HOSPITAL_COMMUNITY)
Admission: EM | Admit: 2019-01-20 | Discharge: 2019-01-20 | Disposition: A | Discharge status: Home / Self Care | Payer: Medicaid Other | Attending: Emergency Medicine | Admitting: Emergency Medicine

## 2019-01-20 DIAGNOSIS — J441 Chronic obstructive pulmonary disease with (acute) exacerbation: Secondary | ICD-10-CM

## 2019-01-20 DIAGNOSIS — Z79899 Other long term (current) drug therapy: Secondary | ICD-10-CM | POA: Insufficient documentation

## 2019-01-20 DIAGNOSIS — D751 Secondary polycythemia: Secondary | ICD-10-CM | POA: Insufficient documentation

## 2019-01-20 DIAGNOSIS — R0902 Hypoxemia: Secondary | ICD-10-CM | POA: Insufficient documentation

## 2019-01-20 DIAGNOSIS — E119 Type 2 diabetes mellitus without complications: Secondary | ICD-10-CM | POA: Insufficient documentation

## 2019-01-20 DIAGNOSIS — Z7984 Long term (current) use of oral hypoglycemic drugs: Secondary | ICD-10-CM | POA: Insufficient documentation

## 2019-01-20 DIAGNOSIS — R0602 Shortness of breath: Secondary | ICD-10-CM

## 2019-01-20 DIAGNOSIS — Z7902 Long term (current) use of antithrombotics/antiplatelets: Secondary | ICD-10-CM | POA: Insufficient documentation

## 2019-01-20 DIAGNOSIS — Z20822 Contact with and (suspected) exposure to covid-19: Secondary | ICD-10-CM | POA: Diagnosis not present

## 2019-01-20 DIAGNOSIS — F17200 Nicotine dependence, unspecified, uncomplicated: Secondary | ICD-10-CM | POA: Insufficient documentation

## 2019-01-20 LAB — COMPREHENSIVE METABOLIC PANEL
ALT: 22 U/L (ref 0–44)
AST: 16 U/L (ref 15–41)
Albumin: 4.2 g/dL (ref 3.5–5.0)
Alkaline Phosphatase: 131 U/L — ABNORMAL HIGH (ref 38–126)
Anion gap: 9 (ref 5–15)
BUN: 13 mg/dL (ref 6–20)
CO2: 26 mmol/L (ref 22–32)
Calcium: 9.8 mg/dL (ref 8.9–10.3)
Chloride: 103 mmol/L (ref 98–111)
Creatinine, Ser: 0.95 mg/dL (ref 0.44–1.00)
GFR calc Af Amer: >60 mL/min (ref >60)
GFR calc non Af Amer: >60 mL/min (ref >60)
Glucose, Bld: 184 mg/dL — ABNORMAL HIGH (ref 70–99)
Potassium: 3.8 mmol/L (ref 3.5–5.1)
Sodium: 138 mmol/L (ref 135–145)
Total Bilirubin: 0.2 mg/dL — ABNORMAL LOW (ref 0.3–1.2)
Total Protein: 8 g/dL (ref 6.5–8.1)

## 2019-01-20 LAB — CBC WITH DIFFERENTIAL/PLATELET
Abs Immature Granulocytes: 0.08 10*3/uL — ABNORMAL HIGH (ref 0.00–0.07)
Basophils Absolute: 0.1 10*3/uL (ref 0.0–0.1)
Basophils Relative: 0 %
Eosinophils Absolute: 1.3 10*3/uL — ABNORMAL HIGH (ref 0.0–0.5)
Eosinophils Relative: 8 %
HCT: 48.5 % — ABNORMAL HIGH (ref 36.0–46.0)
Hemoglobin: 15.9 g/dL — ABNORMAL HIGH (ref 12.0–15.0)
Immature Granulocytes: 1 %
Lymphocytes Relative: 23 %
Lymphs Abs: 3.9 10*3/uL (ref 0.7–4.0)
MCH: 30.8 pg (ref 26.0–34.0)
MCHC: 32.8 g/dL (ref 30.0–36.0)
MCV: 94 fL (ref 80.0–100.0)
Monocytes Absolute: 0.9 10*3/uL (ref 0.1–1.0)
Monocytes Relative: 5 %
Neutro Abs: 10.5 10*3/uL — ABNORMAL HIGH (ref 1.7–7.7)
Neutrophils Relative %: 63 %
Platelets: 288 10*3/uL (ref 150–400)
RBC: 5.16 MIL/uL — ABNORMAL HIGH (ref 3.87–5.11)
RDW: 13.6 % (ref 11.5–15.5)
WBC: 16.7 10*3/uL — ABNORMAL HIGH (ref 4.0–10.5)
nRBC: 0 % (ref 0.0–0.2)

## 2019-01-20 LAB — RESPIRATORY PANEL BY RT PCR (FLU A&B, COVID)
Influenza A by PCR: NEGATIVE
Influenza B by PCR: NEGATIVE
SARS Coronavirus 2 by RT PCR: NEGATIVE

## 2019-01-20 LAB — MAGNESIUM: Magnesium: 1.8 mg/dL (ref 1.7–2.4)

## 2019-01-20 LAB — TROPONIN I (HIGH SENSITIVITY)
Troponin I (High Sensitivity): 4 ng/L (ref <18)
Troponin I (High Sensitivity): <2 ng/L (ref <18)

## 2019-01-20 LAB — POC SARS CORONAVIRUS 2 AG -  ED: SARS Coronavirus 2 Ag: NEGATIVE

## 2019-01-20 LAB — BRAIN NATRIURETIC PEPTIDE: B Natriuretic Peptide: 50 pg/mL (ref 0.0–100.0)

## 2019-01-20 MED ORDER — ALBUTEROL SULFATE HFA 108 (90 BASE) MCG/ACT IN AERS
8.0000 | INHALATION_SPRAY | Freq: Once | RESPIRATORY_TRACT | Status: AC
  Administered 2019-01-20: 8 via RESPIRATORY_TRACT

## 2019-01-20 MED ORDER — ALBUTEROL SULFATE HFA 108 (90 BASE) MCG/ACT IN AERS
8.0000 | INHALATION_SPRAY | Freq: Once | RESPIRATORY_TRACT | Status: AC
  Administered 2019-01-20: 8 via RESPIRATORY_TRACT
  Filled 2019-01-20: qty 6.7

## 2019-01-20 MED ORDER — IPRATROPIUM BROMIDE 0.02 % IN SOLN
0.5000 mg | Freq: Once | RESPIRATORY_TRACT | Status: AC
  Administered 2019-01-20: 0.5 mg via RESPIRATORY_TRACT
  Filled 2019-01-20: qty 2.5

## 2019-01-20 MED ORDER — IPRATROPIUM BROMIDE HFA 17 MCG/ACT IN AERS
2.0000 | INHALATION_SPRAY | Freq: Once | RESPIRATORY_TRACT | Status: DC
  Filled 2019-01-20: qty 12.9

## 2019-01-20 MED ORDER — METHYLPREDNISOLONE SODIUM SUCC 125 MG IJ SOLR
125.0000 mg | Freq: Once | INTRAMUSCULAR | Status: AC
  Administered 2019-01-20: 125 mg via INTRAVENOUS
  Filled 2019-01-20: qty 2

## 2019-01-20 MED ORDER — MAGNESIUM SULFATE 2 GM/50ML IV SOLN
2.0000 g | Freq: Once | INTRAVENOUS | Status: AC
  Administered 2019-01-20: 2 g via INTRAVENOUS
  Filled 2019-01-20: qty 50

## 2019-01-20 NOTE — ED Notes (Signed)
Placed pt on 2LPM via [place] due to O2 being 86% on RA

## 2019-01-20 NOTE — Discharge Instructions (Signed)
Continue to use your breathing medication and supplemental oxygen as needed.  Return if worse.

## 2019-01-20 NOTE — Progress Notes (Signed)
CSW attempting to refer patient out for home 02. [facility] is currently reviewing referral. CSW awaiting call back for decision. Barriers to this referral consist of patients having medicaid for an o2 referral from the ED.   [facility] explains that they are unable to accept this referral as patient is in the ED and is not considered stable under CMS statute   [place] Transitions of Care  Clinical Social Worker  Ph: [phone]

## 2019-01-20 NOTE — ED Notes (Signed)
Patient ambulated to bathroom and back to room.  Patient oxygen dropped to 86% on room air.

## 2019-01-20 NOTE — Progress Notes (Signed)
Consult request has been received. CSW attempting to follow up at present time  Liticia Gasior M. Haylen Shelnutt LCSWA Transitions of Care  Clinical Social Worker  Ph: [phone] 

## 2019-01-20 NOTE — ED Provider Notes (Addendum)
[facility] EMERGENCY DEPARTMENT Provider Note   CSN: [id number] Arrival date & time: 01/20/19  0443   History Chief Complaint  Patient presents with  . Shortness of Breath    Shannon Russell is a 59 y.o. female.  The history is provided by the patient.  Shortness of Breath She has a history of diabetes, COPD, peripheral vascular disease on comes in because of shortness of breath tonight.  She has had a cough productive of yellowish sputum and saw her PCP about 1 week ago and was given a prescription for an antibiotic and albuterol inhaler as well as albuterol for her home nebulizer.  She was generally doing well until tonight when she noted dyspnea got significantly worse.  She denies fever, chills, sweats.  She denies loss of sense of smell or taste.  She denies chest pain or heaviness but has had a tight feeling across her chest.  There has been no nausea, vomiting, diarrhea.  She denies arthralgias or myalgias.  She is not sure if she had her flu shot this year or not.  She denies exposure to COVID-19.  Past Medical History:  Diagnosis Date  . Arthritis   . Asthma   . Cancer ([place])   . COPD (chronic obstructive pulmonary disease) ([place])   . Depression   . Diabetes mellitus without complication ([place])   . H/O blood clots   . PAD (peripheral artery disease) ([place])   . Thyroid disease     Patient Active Problem List   Diagnosis Date Noted  . COPD exacerbation ([place]) 04/20/2018  . Nicotine dependence 04/20/2018  . Type 2 diabetes mellitus with diabetic peripheral angiopathy without gangrene ([place]) 04/20/2018  . PAD (peripheral artery disease) ([place]) 04/20/2018    Past Surgical History:  Procedure Laterality Date  . Bypass right leg  2015  . CHOLECYSTECTOMY  1981  . Mesh implant right leg  2015  . THYROIDECTOMY  2013  . TUBAL LIGATION  1993     OB History   No obstetric history on file.     Family History  Problem Relation Age of Onset  . Heart failure Mother   .  Depression Mother   . Peripheral Artery Disease Sister   . Cancer Sister   . Clotting disorder Sister   . Depression Sister   . Thyroid disease Sister     Social History   Tobacco Use  . Smoking status: Current Every Day Smoker  . Smokeless tobacco: Never Used  Substance Use Topics  . Alcohol use: Not Currently  . Drug use: Not Currently    Home Medications Prior to Admission medications   Medication Sig Start Date End Date Taking? Authorizing Provider  albuterol (2.5 MG/3ML) 0.083% NEBU 3 mL, albuterol (5 MG/ML) 0.5% NEBU 0.5 mL Inhale 2.5 mg into the lungs every 4 (four) hours as needed (wheezing, SOB).    [provider]  albuterol (PROVENTIL HFA;VENTOLIN HFA) 108 (90 Base) MCG/ACT inhaler Inhale 2 puffs into the lungs every 6 (six) hours as needed for wheezing.    [provider]  atorvastatin (LIPITOR) 40 MG tablet Take 40 mg by mouth daily.    [provider]  budesonide (PULMICORT) 0.5 MG/2ML nebulizer solution Take 0.5 mg by nebulization 2 (two) times daily.    [provider]  clopidogrel (PLAVIX) 75 MG tablet Take 75 mg by mouth daily.    [provider]  levothyroxine (SYNTHROID, LEVOTHROID) 137 MCG tablet Take 137 mcg by mouth daily before breakfast.  [provider]  meloxicam (MOBIC) 15 MG tablet Take 15 mg by mouth daily.    [provider]  metFORMIN (GLUCOPHAGE) 1000 MG tablet Take 1,000 mg by mouth daily with supper.    [provider]  pregabalin (LYRICA) 75 MG capsule Take 75 mg by mouth 3 (three) times daily.    [provider]  propranolol (INDERAL) 40 MG tablet Take 40 mg by mouth 2 (two) times daily.    [provider]  VITAMIN D, ERGOCALCIFEROL, PO Take 1,000 Units by mouth 2 (two) times daily.    [provider]    Allergies    Advair diskus [fluticasone-salmeterol] and Iodine  Review of Systems   Review of Systems  Respiratory: Positive for shortness  of breath.   All other systems reviewed and are negative.   Physical Exam Updated Vital Signs Resp (!) 22   Ht 5\' 3"  (1.6 m)   Wt 83 kg   SpO2 (!) 86%   BMI 32.42 kg/m   Physical Exam Vitals and nursing note reviewed.   59 year old female, resting comfortably and in no acute distress. Vital signs are significant for elevated respiratory rate. Oxygen saturation is 86%, which is hypoxic. Head is normocephalic and atraumatic. PERRLA, EOMI. Oropharynx is clear. Neck is nontender and supple without adenopathy or JVD. Back is nontender and there is no CVA tenderness. Lungs have diffuse inspiratory and expiratory wheezes without rales or rhonchi.  Airflow is generally diminished. Chest is nontender. Heart has regular rate and rhythm without murmur. Abdomen is soft, flat, nontender without masses or hepatosplenomegaly and peristalsis is normoactive. Extremities have no cyanosis or edema, full range of motion is present. Skin is warm and dry without rash. Neurologic: Mental status is normal, cranial nerves are intact, there are no motor or sensory deficits.  ED Results / Procedures / Treatments   Labs (all labs ordered are listed, but only abnormal results are displayed) Labs Reviewed  CBC WITH DIFFERENTIAL/PLATELET - Abnormal; Notable for the following components:      Result Value   WBC 16.7 (*)    RBC 5.16 (*)    Hemoglobin 15.9 (*)    HCT 48.5 (*)    Neutro Abs 10.5 (*)    Eosinophils Absolute 1.3 (*)    Abs Immature Granulocytes 0.08 (*)    All other components within normal limits  COMPREHENSIVE METABOLIC PANEL - Abnormal; Notable for the following components:   Glucose, Bld 184 (*)    Alkaline Phosphatase 131 (*)    Total Bilirubin 0.2 (*)    All other components within normal limits  RESPIRATORY PANEL BY RT PCR (FLU A&B, COVID)  BRAIN NATRIURETIC PEPTIDE  MAGNESIUM  POC SARS CORONAVIRUS 2 AG -  ED  TROPONIN I (HIGH SENSITIVITY)  TROPONIN I (HIGH SENSITIVITY)     EKG EKG Interpretation  Date/Time:  Tuesday January 20 2019 05:05:52 EST Ventricular Rate:  98 PR Interval:    QRS Duration: 100 QT Interval:  372 QTC Calculation: 475 R Axis:   91 Text Interpretation: Sinus tachycardia Paired ventricular premature complexes Biatrial enlargement Borderline right axis deviation Borderline repolarization abnormality Baseline wander in lead(s) I II aVR aVF When compared with ECG of 04/20/2018, No significant change was found Confirmed by Delora Fuel ([id number]) on 01/20/2019 5:20:23 AM   Radiology DG Chest Port 1 View  Result Date: 01/20/2019 CLINICAL DATA:  Shortness of breath EXAM: PORTABLE CHEST 1 VIEW COMPARISON:  April 20, 2018 FINDINGS: The heart size and  mediastinal contours are within normal limits. Both lungs are clear. The visualized skeletal structures are unremarkable. IMPRESSION: No active disease. Electronically Signed   By: Prudencio Pair M.D.   On: 01/20/2019 05:40    Procedures Procedures  CRITICAL CARE Performed by: Delora Fuel Total critical care time: 45 minutes Critical care time was exclusive of separately billable procedures and treating other patients. Critical care was necessary to treat or prevent imminent or life-threatening deterioration. Critical care was time spent personally by me on the following activities: development of treatment plan with patient and/or surrogate as well as nursing, discussions with consultants, evaluation of patient's response to treatment, examination of patient, obtaining history from patient or surrogate, ordering and performing treatments and interventions, ordering and review of laboratory studies, ordering and review of radiographic studies, pulse oximetry and re-evaluation of patient's condition.  Medications Ordered in ED Medications  ipratropium (ATROVENT HFA) inhaler 2 puff (2 puffs Inhalation Not Given 01/20/19 0607)  albuterol (VENTOLIN HFA) 108 (90 Base) MCG/ACT inhaler 8 puff (has no  administration in time range)  methylPREDNISolone sodium succinate (SOLU-MEDROL) 125 mg/2 mL injection 125 mg (125 mg Intravenous Given 01/20/19 0550)  albuterol (VENTOLIN HFA) 108 (90 Base) MCG/ACT inhaler 8 puff (8 puffs Inhalation Given 01/20/19 0550)  magnesium sulfate IVPB 2 g 50 mL (0 g Intravenous Stopped 01/20/19 0646)  albuterol (VENTOLIN HFA) 108 (90 Base) MCG/ACT inhaler 8 puff (8 puffs Inhalation Given 01/20/19 0645)    ED Course  I have reviewed the triage vital signs and the nursing notes.  Pertinent labs & imaging results that were available during my care of the patient were reviewed by me and considered in my medical decision making (see chart for details).  MDM Rules/Calculators/A&P COPD exacerbation with hypoxia.  She is placed on nasal oxygen with adequate oxygen saturations following application of nasal oxygen.  She will be given methylprednisolone, albuterol, ipratropium.  6:23 AM She thinks that she might have some improvement in her breathing, but respiratory exam is unchanged.  She is given additional albuterol via nebulizer.  Chest x-ray does not show any evidence of pneumonia.  Labs are significant for leukocytosis and mild polycythemia.  ECG is unchanged from baseline.   7:00 AM There was little change following second round of albuterol inhalations.  She continues to need supplemental oxygen.  She will be given a third round of albuterol inhalations.  Case is signed out to Dr. Lacinda Axon.  Unless there is dramatic improvement, she will need admission.  COVID-19 PCR test is still pending.  Shannon Russell was evaluated in Emergency Department on 01/20/2019 for the symptoms described in the history of present illness. She was evaluated in the context of the global COVID-19 pandemic, which necessitated consideration that the patient might be at risk for infection with the SARS-CoV-2 virus that causes COVID-19. Institutional protocols and algorithms that pertain to the evaluation of  patients at risk for COVID-19 are in a state of rapid change based on information released by regulatory bodies including the CDC and federal and state organizations. These policies and algorithms were followed during the patient's care in the ED.   Final Clinical Impression(s) / ED Diagnoses Final diagnoses:  SOB (shortness of breath)    Rx / DC Orders ED Discharge Orders    None       Delora Fuel, MD [id number]    Delora Fuel, MD [id number] [phone]

## 2019-01-20 NOTE — Progress Notes (Signed)
CSW received call back from [place] concerning O2 referral. Referral has been received and approved.  Representative will be out to deliver portable tank shortly  CSW made family aware of referral.   Shannon Russell [facility] Transitions of Care  Clinical Social Worker  Ph: [phone]

## 2019-01-20 NOTE — ED Triage Notes (Signed)
Pt with shortness of breath x 1 week. Was seen by PCP and prescribed ABX and inhaler.

## 2019-02-05 ENCOUNTER — Ambulatory Visit: Payer: Medicaid Other | Admitting: Pulmonary Disease

## 2019-02-05 ENCOUNTER — Encounter: Payer: Self-pay | Admitting: Pulmonary Disease

## 2019-02-05 ENCOUNTER — Other Ambulatory Visit: Payer: Self-pay

## 2019-02-05 VITALS — BP 136/80 | HR 78 | Temp 97.0°F | Ht 63.0 in | Wt 171.0 lb

## 2019-02-05 DIAGNOSIS — F17210 Nicotine dependence, cigarettes, uncomplicated: Secondary | ICD-10-CM

## 2019-02-05 DIAGNOSIS — J441 Chronic obstructive pulmonary disease with (acute) exacerbation: Secondary | ICD-10-CM

## 2019-02-05 DIAGNOSIS — J449 Chronic obstructive pulmonary disease, unspecified: Secondary | ICD-10-CM

## 2019-02-05 DIAGNOSIS — R0902 Hypoxemia: Secondary | ICD-10-CM

## 2019-02-05 MED ORDER — PREDNISONE 10 MG (21) PO TBPK: ORAL_TABLET | ORAL | 0 refills | Status: DC

## 2019-02-05 MED ORDER — AZITHROMYCIN 250 MG PO TABS: ORAL_TABLET | ORAL | 0 refills | Status: DC

## 2019-02-05 MED ORDER — IPRATROPIUM-ALBUTEROL 0.5-2.5 (3) MG/3ML IN SOLN: 3.0000 mL | Freq: Four times a day (QID) | RESPIRATORY_TRACT | 6 refills | Status: DC | PRN

## 2019-02-05 MED ORDER — BUDESONIDE 0.5 MG/2ML IN SUSP: 0.5000 mg | Freq: Two times a day (BID) | RESPIRATORY_TRACT | 6 refills | Status: DC

## 2019-02-05 NOTE — Progress Notes (Signed)
 Assessment & Plan:  1. COPD with acute exacerbation (HCC) (Primary)  2. COPD mixed type (HCC)  3. Tobacco dependence due to cigarettes  4. Hypoxia   Patient Instructions  Use your oxygen at nighttime CONSISTENTLY  Use DuoNeb (ipratropium/albuterol ) 4 times a day via nebulizer  Use Pulmicort  (budesonide ) twice a day AFTER the DuoNeb  We will give you a short course of prednisone   We will give you a short course of antibiotic  Follow-up in 3 to 4 weeks time.  Call sooner if you have any difficulties prior to that  Currently we cannot do breathing test due to the COVID-19 pandemic these will be scheduled at a later time  Please note: late entry documentation due to logistical difficulties during COVID-19 pandemic. This note is filed for information purposes only, and is not intended to be used for billing, nor does it represent the full scope/nature of the visit in question. Please see any associated scanned media linked to date of encounter for additional pertinent information.  Subjective:    HPI: Shannon Russell is a 59 y.o. female presenting to the pulmonology clinic on 02/05/2019 with report of: Pulmonary Consult (Been coughing for 3 weeks. Productive cough with yellowish. SOB on exertion. Wheezing at night. Pt denies any fever or chills. On O2 @ home 2.5L uses mainly at night.)     Outpatient Encounter Medications as of 02/05/2019  Medication Sig   [DISCONTINUED] albuterol  (2.5 MG/3ML) 0.083% NEBU 3 mL, albuterol  (5 MG/ML) 0.5% NEBU 0.5 mL Inhale 2.5 mg into the lungs every 4 (four) hours as needed (wheezing, SOB).   [DISCONTINUED] albuterol  (PROVENTIL  HFA;VENTOLIN  HFA) 108 (90 Base) MCG/ACT inhaler Inhale 2 puffs into the lungs every 6 (six) hours as needed for wheezing.   [DISCONTINUED] atorvastatin  (LIPITOR) 40 MG tablet Take 40 mg by mouth daily. (Patient not taking: Reported on 06/08/2020)   [DISCONTINUED] budesonide  (PULMICORT ) 0.5 MG/2ML nebulizer solution Take 0.5  mg by nebulization 2 (two) times daily.   [DISCONTINUED] budesonide  (PULMICORT ) 0.5 MG/2ML nebulizer solution Take 2 mLs (0.5 mg total) by nebulization 2 (two) times daily. (Patient not taking: Reported on 07/27/2021)   [DISCONTINUED] clopidogrel  (PLAVIX ) 75 MG tablet Take 75 mg by mouth daily.   [DISCONTINUED] fluticasone  (FLONASE ) 50 MCG/ACT nasal spray Place 1 spray into both nostrils daily. (Patient not taking: Reported on 06/08/2020)   [DISCONTINUED] furosemide  (LASIX ) 20 MG tablet Take 1-2 tablets by mouth daily as needed. (Patient not taking: Reported on 06/08/2020)   [DISCONTINUED] levofloxacin  (LEVAQUIN ) 750 MG tablet Take 750 mg by mouth daily.   [DISCONTINUED] levothyroxine  (SYNTHROID , LEVOTHROID) 137 MCG tablet Take 137 mcg by mouth daily before breakfast.  (Patient not taking: Reported on 06/08/2020)   [DISCONTINUED] meloxicam (MOBIC) 15 MG tablet Take 15 mg by mouth daily. (Patient not taking: No sig reported)   [DISCONTINUED] metFORMIN  (GLUCOPHAGE -XR) 500 MG 24 hr tablet Take 1,000 mg by mouth at bedtime.   [DISCONTINUED] pregabalin  (LYRICA ) 75 MG capsule Take 75 mg by mouth 3 (three) times daily.   [DISCONTINUED] propranolol (INDERAL) 40 MG tablet Take 40 mg by mouth 2 (two) times daily. (Patient not taking: No sig reported)   [DISCONTINUED] VIRTUSSIN A/C 100-10 MG/5ML syrup Take 10 mLs by mouth 3 (three) times daily as needed.   [DISCONTINUED] VITAMIN D, ERGOCALCIFEROL, PO Take 1,000 Units by mouth 2 (two) times daily. (Patient not taking: Reported on 06/08/2020)   [DISCONTINUED] azithromycin  (ZITHROMAX ) 250 MG tablet 2 tablets on the first day.  Then 1 tablet daily  until completed.   [DISCONTINUED] ipratropium-albuterol  (DUONEB) 0.5-2.5 (3) MG/3ML SOLN Take 3 mLs by nebulization every 6 (six) hours as needed.   [DISCONTINUED] predniSONE  (STERAPRED UNI-PAK 21 TAB) 10 MG (21) TBPK tablet Take as directed on package   No facility-administered encounter medications on file as of 02/05/2019.       Objective:   Vitals:   02/05/19 1120  BP: 136/80  Pulse: 78  Temp: (!) 97 F (36.1 C)  Height: 5' 3 (1.6 m)  Weight: 171 lb (77.6 kg)  SpO2: 93% Comment: on ra  TempSrc: Temporal  BMI (Calculated): 30.3     Physical exam documentation is limited by delayed entry of information.

## 2019-02-05 NOTE — Patient Instructions (Addendum)
Use your oxygen at nighttime CONSISTENTLY  Use DuoNeb (ipratropium/albuterol) 4 times a day via nebulizer  Use Pulmicort (budesonide) twice a day AFTER the DuoNeb  We will give you a short course of prednisone  We will give you a short course of antibiotic  Follow-up in 3 to 4 weeks time.  Call sooner if you have any difficulties prior to that  Currently we cannot do breathing test due to the COVID-19 pandemic these will be scheduled at a later time

## 2019-03-04 ENCOUNTER — Other Ambulatory Visit: Payer: Self-pay

## 2019-03-04 ENCOUNTER — Encounter: Payer: Self-pay | Admitting: Pulmonary Disease

## 2019-03-04 ENCOUNTER — Ambulatory Visit: Payer: Medicaid Other | Admitting: Pulmonary Disease

## 2019-03-04 VITALS — BP 114/66 | HR 67 | Temp 97.5°F | Ht 63.0 in | Wt 171.4 lb

## 2019-03-04 DIAGNOSIS — K219 Gastro-esophageal reflux disease without esophagitis: Secondary | ICD-10-CM

## 2019-03-04 DIAGNOSIS — J441 Chronic obstructive pulmonary disease with (acute) exacerbation: Secondary | ICD-10-CM

## 2019-03-04 DIAGNOSIS — R079 Chest pain, unspecified: Secondary | ICD-10-CM

## 2019-03-04 DIAGNOSIS — F17210 Nicotine dependence, cigarettes, uncomplicated: Secondary | ICD-10-CM

## 2019-03-04 MED ORDER — PANTOPRAZOLE SODIUM 40 MG PO TBEC: 40.0000 mg | DELAYED_RELEASE_TABLET | Freq: Every day | ORAL | 3 refills | Status: DC

## 2019-03-04 NOTE — Progress Notes (Signed)
 Assessment & Plan:  1. COPD exacerbation (HCC) (Primary)  2. Chest pain due to GERD  3. Tobacco dependence due to cigarettes   Patient Instructions  Lets hold off on powdered inhalers for now.  Particularly since you are still having issues with cough.  We will start medication for reflux I think this is playing a big component in your ongoing symptoms.  We will schedule you for lung cancer screening program.  Continue DuoNeb's 4 times a day and Pulmicort  twice a day via nebulizer.  Follow-up in 4 to 6 weeks time.  Call sooner should any new difficulties arise.  Please note: late entry documentation due to logistical difficulties during COVID-19 pandemic. This note is filed for information purposes only, and is not intended to be used for billing, nor does it represent the full scope/nature of the visit in question. Please see any associated scanned media linked to date of encounter for additional pertinent information.  Subjective:    HPI: Shannon Russell is a 59 y.o. female presenting to the pulmonology clinic on 03/04/2019 with report of: Follow-up (C/o chest soreness. SOB on exertion. Pt states she has a cough from smoking. Wheezes at night. Pt denies any fever, chills, or sweats.)     Outpatient Encounter Medications as of 03/04/2019  Medication Sig   [DISCONTINUED] albuterol  (2.5 MG/3ML) 0.083% NEBU 3 mL, albuterol  (5 MG/ML) 0.5% NEBU 0.5 mL Inhale 2.5 mg into the lungs every 4 (four) hours as needed (wheezing, SOB).   [DISCONTINUED] albuterol  (PROVENTIL  HFA;VENTOLIN  HFA) 108 (90 Base) MCG/ACT inhaler Inhale 2 puffs into the lungs every 6 (six) hours as needed for wheezing.   [DISCONTINUED] atorvastatin  (LIPITOR) 40 MG tablet Take 40 mg by mouth daily. (Patient not taking: Reported on 06/08/2020)   [DISCONTINUED] azithromycin  (ZITHROMAX ) 250 MG tablet 2 tablets on the first day.  Then 1 tablet daily until completed.   [DISCONTINUED] budesonide  (PULMICORT ) 0.5 MG/2ML  nebulizer solution Take 2 mLs (0.5 mg total) by nebulization 2 (two) times daily. (Patient not taking: Reported on 07/27/2021)   [DISCONTINUED] clopidogrel  (PLAVIX ) 75 MG tablet Take 75 mg by mouth daily.   [DISCONTINUED] fluticasone  (FLONASE ) 50 MCG/ACT nasal spray Place 1 spray into both nostrils daily. (Patient not taking: Reported on 06/08/2020)   [DISCONTINUED] furosemide  (LASIX ) 20 MG tablet Take 1-2 tablets by mouth daily as needed. (Patient not taking: Reported on 06/08/2020)   [DISCONTINUED] ipratropium-albuterol  (DUONEB) 0.5-2.5 (3) MG/3ML SOLN Take 3 mLs by nebulization every 6 (six) hours as needed.   [DISCONTINUED] levothyroxine  (SYNTHROID , LEVOTHROID) 137 MCG tablet Take 137 mcg by mouth daily before breakfast.  (Patient not taking: Reported on 06/08/2020)   [DISCONTINUED] meloxicam (MOBIC) 15 MG tablet Take 15 mg by mouth daily. (Patient not taking: No sig reported)   [DISCONTINUED] metFORMIN  (GLUCOPHAGE -XR) 500 MG 24 hr tablet Take 1,000 mg by mouth at bedtime.   [DISCONTINUED] predniSONE  (STERAPRED UNI-PAK 21 TAB) 10 MG (21) TBPK tablet Take as directed on package   [DISCONTINUED] pregabalin  (LYRICA ) 75 MG capsule Take 75 mg by mouth 3 (three) times daily.   [DISCONTINUED] propranolol (INDERAL) 40 MG tablet Take 40 mg by mouth 2 (two) times daily. (Patient not taking: No sig reported)   [DISCONTINUED] tiotropium (SPIRIVA  HANDIHALER) 18 MCG inhalation capsule Place 18 mcg into inhaler and inhale daily.   [DISCONTINUED] VITAMIN D, ERGOCALCIFEROL, PO Take 1,000 Units by mouth 2 (two) times daily. (Patient not taking: Reported on 06/08/2020)   [DISCONTINUED] pantoprazole  (PROTONIX ) 40 MG tablet Take 1 tablet (40  mg total) by mouth daily.   No facility-administered encounter medications on file as of 03/04/2019.      Objective:   Vitals:   03/04/19 1121  BP: 114/66  Pulse: 67  Temp: (!) 97.5 F (36.4 C)  Height: 5' 3 (1.6 m)  Weight: 171 lb 6.4 oz (77.7 kg)  SpO2: 98% Comment: on ra   TempSrc: Temporal  BMI (Calculated): 30.37     Physical exam documentation is limited by delayed entry of information.

## 2019-03-04 NOTE — Patient Instructions (Signed)
Lets hold off on powdered inhalers for now.  Particularly since you are still having issues with cough.  We will start medication for reflux I think this is playing a big component in your ongoing symptoms.  We will schedule you for lung cancer screening program.  Continue DuoNeb's 4 times a day and Pulmicort twice a day via nebulizer.  Follow-up in 4 to 6 weeks time.  Call sooner should any new difficulties arise.

## 2019-03-07 ENCOUNTER — Telehealth: Payer: Self-pay | Admitting: *Deleted

## 2019-03-07 DIAGNOSIS — Z87891 Personal history of nicotine dependence: Secondary | ICD-10-CM

## 2019-03-07 NOTE — Telephone Encounter (Signed)
Received referral for initial lung cancer screening scan. Contacted patient and obtained smoking history,(current, 45 pack year) as well as answering questions related to screening process. Patient denies signs of lung cancer such as weight loss or hemoptysis. Patient denies comorbidity that would prevent curative treatment if lung cancer were found. Patient is scheduled for shared decision making visit and CT scan on 03/11/19 at 1015am.

## 2019-03-11 ENCOUNTER — Other Ambulatory Visit: Payer: Self-pay

## 2019-03-11 ENCOUNTER — Inpatient Hospital Stay: Payer: Medicaid Other | Attending: Oncology | Admitting: Oncology

## 2019-03-11 ENCOUNTER — Ambulatory Visit
Admission: RE | Admit: 2019-03-11 | Discharge: 2019-03-11 | Disposition: A | Discharge status: Home / Self Care | Payer: Medicaid Other | Source: Ambulatory Visit | Attending: Oncology | Admitting: Oncology

## 2019-03-11 ENCOUNTER — Encounter: Payer: Self-pay | Admitting: Oncology

## 2019-03-11 ENCOUNTER — Telehealth: Payer: Self-pay | Admitting: *Deleted

## 2019-03-11 DIAGNOSIS — Z87891 Personal history of nicotine dependence: Secondary | ICD-10-CM | POA: Diagnosis not present

## 2019-03-11 NOTE — Telephone Encounter (Signed)
Pt called to get results for LDCT scan from today. Results reviewed with patient. Informed that will be due for anther LDCT scan in 12 months from today. Informed that Raquel Sarna will coordinate her next follow up scan and will contact her with those appts. Pt verbalized understanding.

## 2019-03-11 NOTE — Progress Notes (Signed)
Virtual Visit via Video Note  I connected with Mr. Camerer on 03/11/19 at 10:30 AM EST by a video enabled telemedicine application and verified that I am speaking with the correct person using two identifiers.  Location: Patient: [facility] Provider: Office   I discussed the limitations of evaluation and management by telemedicine and the availability of in person appointments. The patient expressed understanding and agreed to proceed.  I discussed the assessment and treatment plan with the patient. The patient was provided an opportunity to ask questions and all were answered. The patient agreed with the plan and demonstrated an understanding of the instructions.   The patient was advised to call back or seek an in-person evaluation if the symptoms worsen or if the condition fails to improve as anticipated.   In accordance with CMS guidelines, patient has met eligibility criteria including age, absence of signs or symptoms of lung cancer.  Social History   Tobacco Use  . Smoking status: Current Every Day Smoker    Packs/day: 1.00    Years: 45.00    Pack years: 45.00    Types: Cigarettes  . Smokeless tobacco: Never Used  Substance Use Topics  . Alcohol use: Not Currently  . Drug use: Not Currently      A shared decision-making session was conducted prior to the performance of CT scan. This includes one or more decision aids, includes benefits and harms of screening, follow-up diagnostic testing, over-diagnosis, false positive rate, and total radiation exposure.   Counseling on the importance of adherence to annual lung cancer LDCT screening, impact of co-morbidities, and ability or willingness to undergo diagnosis and treatment is imperative for compliance of the program.   Counseling on the importance of continued smoking cessation for former smokers; the importance of smoking cessation for current smokers, and information about tobacco cessation interventions have been given to patient  including [place] [facility] and 1800 quit [place] programs.   Written order for lung cancer screening with LDCT has been given to the patient and any and all questions have been answered to the best of my abilities.    Yearly follow up will be coordinated by Shawn Perkins, Thoracic Navigator.  I provided 15 minutes of face-to-face video visit time during this encounter, and > 50% was spent counseling as documented under my assessment & plan.   Jennifer E Burns, NP  

## 2019-03-17 NOTE — Progress Notes (Signed)
No evidence of cancer on low-dose cancer screening, continue repeat CT per lung cancer screening guidelines.

## 2019-03-18 ENCOUNTER — Telehealth: Payer: Self-pay

## 2019-03-18 NOTE — Telephone Encounter (Signed)
-----   Message from Tyler Pita, MD sent at 03/17/2019  3:29 PM EST -----   ----- Message ----- From: Dimple Casey, RT Sent: 03/11/2019  10:38 AM EST To: Tyler Pita, MD

## 2019-03-18 NOTE — Telephone Encounter (Signed)
No evidence of cancer on low-dose cancer screening, continue repeat CT per lung cancer screening guidelines.   Pt is aware of results and voiced her understanding.  Nothing further is needed.

## 2019-04-08 ENCOUNTER — Ambulatory Visit (INDEPENDENT_AMBULATORY_CARE_PROVIDER_SITE_OTHER): Payer: Medicaid Other | Admitting: Primary Care

## 2019-04-08 ENCOUNTER — Encounter: Payer: Self-pay | Admitting: Primary Care

## 2019-04-08 DIAGNOSIS — J441 Chronic obstructive pulmonary disease with (acute) exacerbation: Secondary | ICD-10-CM | POA: Diagnosis not present

## 2019-04-08 MED ORDER — FAMOTIDINE 20 MG PO TABS: 20.0000 mg | ORAL_TABLET | Freq: Every day | ORAL | 6 refills | Status: DC

## 2019-04-08 NOTE — Progress Notes (Signed)
Virtual Visit via Telephone Note  I connected with Shannon Russell on 04/08/19 at 11:30 AM EDT by telephone and verified that I am speaking with the correct person using two identifiers.  Location: Patient: Home Provider: Office, [place] discussed the limitations, risks, security and privacy concerns of performing an evaluation and management service by telephone and the availability of in person appointments. I also discussed with the patient that there may be a patient responsible charge related to this service. The patient expressed understanding and agreed to proceed.  History of Present Illness: 59 year old female, current every day smoked (40+ pack year hx). PMH significant for COPD exacerbation, type 2 diabetes, PAD. Patient of Dr. Patsey Berthold. LDCT in March 2020 showed lung RADS1.   04/08/2019 Patient contacted today for follow-up visit. She states that she is not sleeping at night d/t cough. Occasionally experiences nocturnal dyspnea. She has never had pulmonary function testing, however, has a significant smoking history. She has tried Advair and Spiriva handihaler in the past which have worsened her cough. During last visit it was recommended to hold off on dry powder inhaler. She has been using pulmicort inhaler but does not report any perceived benefit.    Observations/Objective:  - Able to speak in full sentences without shortness of breath or overt wheezing   Assessment and Plan:  COPD: - Trial Breztri two puffs twice daily - Stop Pulmicort nebulizer - Change duoneb to as needed only for shortness of breath and wheezing every 8 hours  - Order PFTs  Tobacco use: - LDCT in March 2020 showed lung RADS1 - Encourage smoking cessation - Annual lung cancer screening  Follow Up Instructions:   - 2-4 weeks with Dr. Patsey Berthold or APP  I discussed the assessment and treatment plan with the patient. The patient was provided an opportunity to ask questions and all were  answered. The patient agreed with the plan and demonstrated an understanding of the instructions.   The patient was advised to call back or seek an in-person evaluation if the symptoms worsen or if the condition fails to improve as anticipated.  I provided  18 minutes of non-face-to-face time during this encounter.   Martyn Ehrich, NP

## 2019-04-08 NOTE — Patient Instructions (Addendum)
Recommendations: Stop Pulmicort nebulizer Trial Breztri- take two puffs twice daily (rinse out after) Change duoneb to as needed only for shortness of breath and wheezing every 8 hours   Orders: PFTs  Follow-up: 2-4 weeks with Dr. Patsey Russell or APP

## 2019-04-09 ENCOUNTER — Encounter: Payer: Self-pay | Admitting: Primary Care

## 2019-04-13 ENCOUNTER — Other Ambulatory Visit: Payer: Self-pay

## 2019-04-13 ENCOUNTER — Other Ambulatory Visit
Admission: RE | Admit: 2019-04-13 | Discharge: 2019-04-13 | Disposition: A | Discharge status: Home / Self Care | Payer: Medicaid Other | Source: Ambulatory Visit | Attending: Pulmonary Disease | Admitting: Pulmonary Disease

## 2019-04-13 DIAGNOSIS — Z20822 Contact with and (suspected) exposure to covid-19: Secondary | ICD-10-CM | POA: Diagnosis not present

## 2019-04-13 DIAGNOSIS — Z01812 Encounter for preprocedural laboratory examination: Secondary | ICD-10-CM | POA: Diagnosis not present

## 2019-04-13 LAB — SARS CORONAVIRUS 2 (TAT 6-24 HRS): SARS Coronavirus 2: NEGATIVE

## 2019-04-14 ENCOUNTER — Other Ambulatory Visit: Payer: Self-pay

## 2019-04-14 ENCOUNTER — Ambulatory Visit: Payer: Medicaid Other | Attending: Primary Care

## 2019-04-14 DIAGNOSIS — F17210 Nicotine dependence, cigarettes, uncomplicated: Secondary | ICD-10-CM | POA: Diagnosis not present

## 2019-04-14 DIAGNOSIS — J441 Chronic obstructive pulmonary disease with (acute) exacerbation: Secondary | ICD-10-CM | POA: Insufficient documentation

## 2019-07-09 NOTE — Progress Notes (Signed)
Agree with the details of the visit as noted below by Geraldo Pitter, NP.  It appears that the patient has now elected to change to [facility] for follow-up.   Renold Don, MD [place] [facility]

## 2019-10-16 ENCOUNTER — Telehealth: Payer: Self-pay | Admitting: Primary Care

## 2019-10-16 NOTE — Telephone Encounter (Signed)
Faxed most recent OV notes from 04/08/19 to [facility] and called and left a voice mail letting her know I was doing so. Nothing further needed at this time.

## 2020-03-08 ENCOUNTER — Telehealth: Payer: Self-pay | Admitting: *Deleted

## 2020-03-08 DIAGNOSIS — Z122 Encounter for screening for malignant neoplasm of respiratory organs: Secondary | ICD-10-CM

## 2020-03-08 DIAGNOSIS — Z87891 Personal history of nicotine dependence: Secondary | ICD-10-CM

## 2020-03-08 DIAGNOSIS — F17200 Nicotine dependence, unspecified, uncomplicated: Secondary | ICD-10-CM

## 2020-03-08 NOTE — Telephone Encounter (Signed)
Patient scheduled for screening 04/07/2020 2:45PM. She states that she is still smoking 20 cigarettes a day.

## 2020-03-09 NOTE — Telephone Encounter (Signed)
Patient is a current smoker with a 46 pack year history

## 2020-03-23 ENCOUNTER — Other Ambulatory Visit (INDEPENDENT_AMBULATORY_CARE_PROVIDER_SITE_OTHER): Payer: Self-pay | Admitting: Vascular Surgery

## 2020-03-23 DIAGNOSIS — K219 Gastro-esophageal reflux disease without esophagitis: Secondary | ICD-10-CM | POA: Insufficient documentation

## 2020-03-23 DIAGNOSIS — I739 Peripheral vascular disease, unspecified: Secondary | ICD-10-CM

## 2020-03-23 DIAGNOSIS — E785 Hyperlipidemia, unspecified: Secondary | ICD-10-CM | POA: Insufficient documentation

## 2020-03-23 NOTE — Progress Notes (Signed)
MRN : [id number]  Shannon Russell is a 60 y.o. (1960-06-28) female who presents with chief complaint of No chief complaint on file. Marland Kitchen  History of Present Illness:    The patient is seen for evaluation of painful lower extremities and diminished pulses. Patient notes the pain is always associated with activity and is very consistent day today. Typically, the pain occurs at less than one block, progress is as activity continues to the point that the patient must stop walking. Resting including standing still for several minutes allowed resumption of the activity and the ability to walk a similar distance before stopping again. Uneven terrain and inclined shorten the distance. The pain has been progressive over the past several years. The patient states the inability to walk is now having a profound negative impact on quality of life and daily activities.  The patient denies rest pain or dangling of an extremity off the side of the bed during the night for relief. No open wounds or sores at this time. No prior interventions or surgeries.  No history of back problems or DJD of the lumbar sacral spine.   The patient denies changes in claudication symptoms or new rest pain symptoms.  No new ulcers or wounds of the foot.  The patient's blood pressure has been stable and relatively well controlled. The patient denies amaurosis fugax or recent TIA symptoms. There are no recent neurological changes noted. The patient denies history of DVT, PE or superficial thrombophlebitis. The patient denies recent episodes of angina or shortness of breath.   ABI Rt=0.53 and Lt=0.69 Duplex ultrasound of the right lower extremity shows a patent bypass graft with monophasic signals  No outpatient medications have been marked as taking for the 03/24/20 encounter (Appointment) with Delana Meyer, Dolores Lory, MD.    Past Medical History:  Diagnosis Date  . Arthritis   . Asthma   . Cancer ([place])   . COPD (chronic  obstructive pulmonary disease) ([place])   . Depression   . Diabetes mellitus without complication ([place])   . H/O blood clots   . PAD (peripheral artery disease) ([place])   . Thyroid disease     Past Surgical History:  Procedure Laterality Date  . Bypass right leg  2015  . CHOLECYSTECTOMY  1981  . Mesh implant right leg  2015  . THYROIDECTOMY  2013  . TUBAL LIGATION  1993    Social History Social History   Tobacco Use  . Smoking status: Current Every Day Smoker    Packs/day: 1.00    Years: 45.00    Pack years: 45.00    Types: Cigarettes  . Smokeless tobacco: Never Used  Vaping Use  . Vaping Use: Never used  Substance Use Topics  . Alcohol use: Not Currently  . Drug use: Not Currently    Family History Family History  Problem Relation Age of Onset  . Heart failure Mother   . Depression Mother   . Peripheral Artery Disease Sister   . Cancer Sister   . Clotting disorder Sister   . Depression Sister   . Thyroid disease Sister   No family history of bleeding/clotting disorders, porphyria or autoimmune disease   Allergies  Allergen Reactions  . Advair Diskus [Fluticasone-Salmeterol] Hives  . Iodine Rash     REVIEW OF SYSTEMS (Negative unless checked)  Constitutional: [] Weight loss  [] Fever  [] Chills Cardiac: [] Chest pain   [] Chest pressure   [] Palpitations   [] Shortness of breath when laying flat   [] Shortness  of breath with exertion. Vascular:  [x] Pain in legs with walking   [] Pain in legs at rest  [] History of DVT   [] Phlebitis   [] Swelling in legs   [] Varicose veins   [] Non-healing ulcers Pulmonary:   [] Uses home oxygen   [] Productive cough   [] Hemoptysis   [] Wheeze  [] COPD   [] Asthma Neurologic:  [] Dizziness   [] Seizures   [] History of stroke   [] History of TIA  [] Aphasia   [] Vissual changes   [] Weakness or numbness in arm   [] Weakness or numbness in leg Musculoskeletal:   [] Joint swelling   [] Joint pain   [] Low back pain Hematologic:  [] Easy bruising  [] Easy  bleeding   [] Hypercoagulable state   [] Anemic Gastrointestinal:  [] Diarrhea   [] Vomiting  [] Gastroesophageal reflux/heartburn   [] Difficulty swallowing. Genitourinary:  [] Chronic kidney disease   [] Difficult urination  [] Frequent urination   [] Blood in urine Skin:  [] Rashes   [] Ulcers  Psychological:  [] History of anxiety   []  History of major depression.  Physical Examination  There were no vitals filed for this visit. There is no height or weight on file to calculate BMI. Gen: WD/WN, NAD Head: Esto/AT, No temporalis wasting.  Ear/Nose/Throat: Hearing grossly intact, nares w/o erythema or drainage, poor dentition Eyes: PER, EOMI, sclera nonicteric.  Neck: Supple, no masses.  No bruit or JVD.  Pulmonary:  Good air movement, clear to auscultation bilaterally, no use of accessory muscles.  Cardiac: RRR, normal S1, S2, no Murmurs. Vascular: Vessel Right Left  Radial Palpable Palpable  PT Not Palpable Not Palpable  DP Not Palpable Not Palpable  Gastrointestinal: soft, non-distended. No guarding/no peritoneal signs.  Musculoskeletal: M/S 5/5 throughout.  No deformity or atrophy.  Neurologic: CN 2-12 intact. Pain and light touch intact in extremities.  Symmetrical.  Speech is fluent. Motor exam as listed above. Psychiatric: Judgment intact, Mood & affect appropriate for pt's clinical situation. Dermatologic: No rashes or ulcers noted.  No changes consistent with cellulitis.   CBC Lab Results  Component Value Date   WBC 16.7 (H) 01/20/2019   HGB 15.9 (H) 01/20/2019   HCT 48.5 (H) 01/20/2019   MCV 94.0 01/20/2019   PLT 288 01/20/2019    BMET    Component Value Date/Time   NA 138 01/20/2019 0529   K 3.8 01/20/2019 0529   CL 103 01/20/2019 0529   CO2 26 01/20/2019 0529   GLUCOSE 184 (H) 01/20/2019 0529   BUN 13 01/20/2019 0529   CREATININE 0.95 01/20/2019 0529   CALCIUM 9.8 01/20/2019 0529   GFRNONAA >60 01/20/2019 0529   GFRAA >60 01/20/2019 0529   CrCl cannot be calculated  (Patient's most recent lab result is older than the maximum 21 days allowed.).  COAG No results found for: INR, PROTIME  Radiology No results found.    Assessment/Plan 1. PAD (peripheral artery disease) (HCC) Recommend:  The patient has experienced increased symptoms and is now describing lifestyle limiting claudication and mild rest pain.  Her noninvasive studies suggest a patent bypass but with monophasic Doppler signals   Given the severity of the patient's lower extremity symptoms the patient should undergo angiography and intervention.  Risk and benefits were reviewed the patient.  Indications for the procedure were reviewed.  All questions were answered, the patient agrees to proceed.   The patient should continue walking and begin a more formal exercise program.  The patient should continue antiplatelet therapy and aggressive treatment of the lipid abnormalities  The patient will follow up with me  after the CT angiogram.  - CT ANGIO AO+BIFEM W & OR WO CONTRAST; Future  2. Type 2 diabetes mellitus with diabetic peripheral angiopathy without gangrene, without long-term current use of insulin (HCC) Continue hypoglycemic medications as already ordered, these medications have been reviewed and there are no changes at this time.  Hgb A1C to be monitored as already arranged by primary service   3. COPD exacerbation ([place]) Continue pulmonary medications and aerosols as already ordered, these medications have been reviewed and there are no changes at this time.    4. Gastroesophageal reflux disease without esophagitis Continue PPI as already ordered, this medication has been reviewed and there are no changes at this time.  Avoidence of caffeine and alcohol  Moderate elevation of the head of the bed   5. Mixed hyperlipidemia Continue statin as ordered and reviewed, no changes at this time     [name], MD  03/23/2020 9:19 PM

## 2020-03-24 ENCOUNTER — Ambulatory Visit (INDEPENDENT_AMBULATORY_CARE_PROVIDER_SITE_OTHER): Payer: Medicaid Other

## 2020-03-24 ENCOUNTER — Ambulatory Visit (INDEPENDENT_AMBULATORY_CARE_PROVIDER_SITE_OTHER): Payer: Medicaid Other | Admitting: Vascular Surgery

## 2020-03-24 ENCOUNTER — Encounter (INDEPENDENT_AMBULATORY_CARE_PROVIDER_SITE_OTHER): Payer: Self-pay | Admitting: Vascular Surgery

## 2020-03-24 ENCOUNTER — Other Ambulatory Visit: Payer: Self-pay

## 2020-03-24 VITALS — BP 104/66 | HR 81 | Resp 16 | Ht 63.0 in | Wt 175.0 lb

## 2020-03-24 DIAGNOSIS — I739 Peripheral vascular disease, unspecified: Secondary | ICD-10-CM

## 2020-03-24 DIAGNOSIS — E1151 Type 2 diabetes mellitus with diabetic peripheral angiopathy without gangrene: Secondary | ICD-10-CM | POA: Diagnosis not present

## 2020-03-24 DIAGNOSIS — E782 Mixed hyperlipidemia: Secondary | ICD-10-CM

## 2020-03-24 DIAGNOSIS — K219 Gastro-esophageal reflux disease without esophagitis: Secondary | ICD-10-CM | POA: Diagnosis not present

## 2020-03-24 DIAGNOSIS — J441 Chronic obstructive pulmonary disease with (acute) exacerbation: Secondary | ICD-10-CM

## 2020-04-07 ENCOUNTER — Ambulatory Visit: Admission: RE | Admit: 2020-04-07 | Payer: Medicaid Other | Source: Ambulatory Visit

## 2020-04-23 ENCOUNTER — Ambulatory Visit (INDEPENDENT_AMBULATORY_CARE_PROVIDER_SITE_OTHER): Payer: Medicaid Other

## 2020-04-23 ENCOUNTER — Encounter: Payer: Self-pay | Admitting: Emergency Medicine

## 2020-04-23 ENCOUNTER — Ambulatory Visit
Admission: EM | Admit: 2020-04-23 | Discharge: 2020-04-23 | Disposition: A | Discharge status: Home / Self Care | Payer: Medicaid Other | Attending: Emergency Medicine | Admitting: Emergency Medicine

## 2020-04-23 ENCOUNTER — Other Ambulatory Visit: Payer: Self-pay

## 2020-04-23 DIAGNOSIS — R0989 Other specified symptoms and signs involving the circulatory and respiratory systems: Secondary | ICD-10-CM | POA: Diagnosis not present

## 2020-04-23 DIAGNOSIS — R0602 Shortness of breath: Secondary | ICD-10-CM | POA: Diagnosis not present

## 2020-04-23 DIAGNOSIS — J441 Chronic obstructive pulmonary disease with (acute) exacerbation: Secondary | ICD-10-CM

## 2020-04-23 DIAGNOSIS — R059 Cough, unspecified: Secondary | ICD-10-CM | POA: Diagnosis not present

## 2020-04-23 MED ORDER — CEFDINIR 300 MG PO CAPS: 300.0000 mg | ORAL_CAPSULE | Freq: Two times a day (BID) | ORAL | 0 refills | Status: DC

## 2020-04-23 MED ORDER — DEXAMETHASONE SODIUM PHOSPHATE 10 MG/ML IJ SOLN
10.0000 mg | Freq: Once | INTRAMUSCULAR | Status: AC
  Administered 2020-04-23: 10 mg via INTRAMUSCULAR

## 2020-04-23 MED ORDER — PREDNISONE 10 MG (21) PO TBPK: ORAL_TABLET | Freq: Every day | ORAL | 0 refills | Status: DC

## 2020-04-23 NOTE — ED Provider Notes (Addendum)
[place]   [id number] 04/23/20 Arrival Time: [place]  Cc: COUGH  SUBJECTIVE:  Shannon Russell is a 60 y.o. female hx significant for astham, COPD, DM, blood clots, who presents with SOB, wheezing, sinus congestion, and productive cough with white sputum x 3 weeks.  Denies positive sick exposure or precipitating event.  Was treated with steroid, z-pak and tessalon without relief.  Symptoms are made worse with deep breath and cough.  Reports previous symptoms in the past.   Denies fever, chills, rhinorrhea, sore throat, chest pain, nausea, changes in bowel or bladder habits.    ROS: As per HPI.  All other pertinent ROS negative.     Past Medical History:  Diagnosis Date  . Arthritis   . Asthma   . Cancer ([place])   . COPD (chronic obstructive pulmonary disease) ([place])   . Depression   . Diabetes mellitus without complication ([place])   . H/O blood clots   . PAD (peripheral artery disease) ([place])   . Thyroid disease    Past Surgical History:  Procedure Laterality Date  . Bypass right leg  2015  . CHOLECYSTECTOMY  1981  . Mesh implant right leg  2015  . THYROIDECTOMY  2013  . TUBAL LIGATION  1993   Allergies  Allergen Reactions  . Advair Diskus [Fluticasone-Salmeterol] Hives  . Iodine Rash    Other reaction(s): Skin Rashes, Hives   No current facility-administered medications on file prior to encounter.   Current Outpatient Medications on File Prior to Encounter  Medication Sig Dispense Refill  . ACCU-CHEK GUIDE test strip USE 1 STRIP TO CHECK GLUCOSE TWICE DAILY AS NEEDED    . albuterol (2.5 MG/3ML) 0.083% NEBU 3 mL, albuterol (5 MG/ML) 0.5% NEBU 0.5 mL Inhale 2.5 mg into the lungs every 4 (four) hours as needed (wheezing, SOB).    Marland Kitchen albuterol (PROVENTIL HFA;VENTOLIN HFA) 108 (90 Base) MCG/ACT inhaler Inhale 2 puffs into the lungs every 6 (six) hours as needed for wheezing.    Marland Kitchen atorvastatin (LIPITOR) 40 MG tablet Take 40 mg by mouth daily.    . budesonide (PULMICORT) 0.5  MG/2ML nebulizer solution Take 2 mLs (0.5 mg total) by nebulization 2 (two) times daily. 120 mL 6  . Cholecalciferol 1.25 MG (50000 UT) capsule Vitamin D3 1.25 MG (50000 UT) Oral Capsule QTY: 0 capsule Days: 0 Refills: 0  Written: 02/09/20 Patient Instructions: qaw    . clopidogrel (PLAVIX) 75 MG tablet Take 75 mg by mouth daily.    . famotidine (PEPCID) 20 MG tablet Take 1 tablet (20 mg total) by mouth at bedtime. (Patient not taking: Reported on 03/24/2020) 30 tablet 6  . fluticasone (FLONASE) 50 MCG/ACT nasal spray Place 1 spray into both nostrils daily. (Patient not taking: Reported on 03/24/2020)    . furosemide (LASIX) 20 MG tablet Take 1-2 tablets by mouth daily as needed.    . insulin glargine (LANTUS SOLOSTAR) 100 UNIT/ML Solostar Pen 16 Units at bedtime.    Marland Kitchen ipratropium-albuterol (DUONEB) 0.5-2.5 (3) MG/3ML SOLN Take 3 mLs by nebulization every 6 (six) hours as needed. 360 mL 6  . levothyroxine (SYNTHROID, LEVOTHROID) 137 MCG tablet Take 137 mcg by mouth daily before breakfast.     . meloxicam (MOBIC) 15 MG tablet Take 15 mg by mouth daily.    . metFORMIN (GLUCOPHAGE-XR) 500 MG 24 hr tablet Take 1,000 mg by mouth at bedtime.    . pantoprazole (PROTONIX) 40 MG tablet Take 1 tablet (40 mg total) by mouth daily. [place]  tablet 3  . pregabalin (LYRICA) 75 MG capsule Take 75 mg by mouth 3 (three) times daily.    . propranolol (INDERAL) 40 MG tablet Take 40 mg by mouth 2 (two) times daily.    Marland Kitchen VITAMIN D, ERGOCALCIFEROL, PO Take 1,000 Units by mouth 2 (two) times daily. (Patient not taking: Reported on 03/24/2020)      Social History   Socioeconomic History  . Marital status: Legally Separated    Spouse name: Not on file  . Number of children: Not on file  . Years of education: Not on file  . Highest education level: Not on file  Occupational History  . Not on file  Tobacco Use  . Smoking status: Current Every Day Smoker    Packs/day: 1.00    Years: 45.00    Pack years: 45.00    Types:  Cigarettes  . Smokeless tobacco: Never Used  Vaping Use  . Vaping Use: Never used  Substance and Sexual Activity  . Alcohol use: Not Currently  . Drug use: Not Currently  . Sexual activity: Not on file  Other Topics Concern  . Not on file  Social History Narrative  . Not on file   Social Determinants of Health   Financial Resource Strain: Not on file  Food Insecurity: Not on file  Transportation Needs: Not on file  Physical Activity: Not on file  Stress: Not on file  Social Connections: Not on file  Intimate Partner Violence: Not on file   Family History  Problem Relation Age of Onset  . Heart failure Mother   . Depression Mother   . Peripheral Artery Disease Sister   . Cancer Sister   . Clotting disorder Sister   . Depression Sister   . Thyroid disease Sister      OBJECTIVE:  Vitals:   04/23/20 1003  BP: 105/72  Pulse: 95  Resp: 18  Temp: 97.9 F (36.6 C)  TempSrc: Oral  SpO2: (!) 86%    Pulse Ox improved to 90% on RA while resting in exam chair without mask  General appearance: Alert, appears fatigued, but nontoxic; speaking in full sentences without difficulty HEENT:NCAT; Ears: EACs clear, TMs pearly gray; Eyes: PERRL.  EOM grossly intact. Nose: nares patent without rhinorrhea; Throat: tonsils nonerythematous or enlarged, uvula midline  Neck: supple without LAD Lungs: wheezes and rhonchi throughout bilateral lung fields; normal respiratory effort; mild cough present Heart: regular rate and rhythm.   Skin: warm and dry Psychological: alert and cooperative; normal mood and affect  DIAGNOSTIC STUDIES:  DG Chest 2 View  Result Date: 04/23/2020 CLINICAL DATA:  60 year old female with cough, congestion and shortness of breath. Smoker. EXAM: CHEST - 2 VIEW COMPARISON:  Chest CT 03/11/2019.  Radiographs 01/20/2019. FINDINGS: Lung volumes are stable at the upper limits of normal to mildly hyperinflated. Normal cardiac size and mediastinal contours. Visualized  tracheal air column is within normal limits. Chronic increased pulmonary interstitial markings, not significantly changed from prior exams allowing for differences in technique. No pneumothorax, pulmonary edema, pleural effusion or confluent pulmonary opacity. Stable cholecystectomy clips. Negative visible bowel gas pattern. No acute osseous abnormality identified. IMPRESSION: Chronic pulmonary interstitial changes. No acute cardiopulmonary abnormality. Electronically Signed   By: Genevie Ann M.D.   On: 04/23/2020 10:43    I have reviewed the x-rays myself and the radiologist interpretation. I am in agreement with the radiologist interpretation.     ASSESSMENT & PLAN:  1. Cough   2. COPD exacerbation ([place])  Meds ordered this encounter  Medications  . predniSONE (STERAPRED UNI-PAK 21 TAB) 10 MG (21) TBPK tablet    Sig: Take by mouth daily. Take 6 tabs by mouth daily  for 2 days, then 5 tabs for 2 days, then 4 tabs for 2 days, then 3 tabs for 2 days, 2 tabs for 2 days, then 1 tab by mouth daily for 2 days    Dispense:  42 tablet    Refill:  0    Order Specific Question:   Supervising Provider    Answer:   Raylene Everts [[id number]]  . cefdinir (OMNICEF) 300 MG capsule    Sig: Take 1 capsule (300 mg total) by mouth 2 (two) times daily for 10 days.    Dispense:  20 capsule    Refill:  0    Order Specific Question:   Supervising Provider    Answer:   Raylene Everts [[id number]]  . dexamethasone (DECADRON) injection 10 mg    Orders Placed This Encounter  Procedures  . DG Chest 2 View    Standing Status:   Standing    Number of Occurrences:   1    Order Specific Question:   Reason for Exam (SYMPTOM  OR DIAGNOSIS REQUIRED)    Answer:   cough    X-rays not concerning for pneumonia at this time Will cover for COPD exacerbation Steroid shot given in office Prednisone prescribed.  Take as directed and to completion Will trial cefdinir, given minimal improvement with z-pak Use OTC  medication as needed for symptomatic relief Follow up with PCP next week for recheck and/or if symptoms persists Return or go to ER if you have any new or worsening symptoms such as fever, chills, fatigue, shortness of breath, wheezing, chest pain, nausea, changes in bowel or bladder habits, etc...  Reviewed expectations re: course of current medical issues. Questions answered. Outlined signs and symptoms indicating need for more acute intervention. Patient verbalized understanding. After Visit Summary given.          Lestine Box, PA-C 04/23/20 [place], [place], PA-C 04/23/20 1059

## 2020-04-23 NOTE — Discharge Instructions (Signed)
X-rays not concerning for pneumonia at this time Will cover for COPD exacerbation Steroid shot given in office Prednisone prescribed.  Take as directed and to completion Will trial cefdinir, given minimal improvement with z-pak Use OTC medication as needed for symptomatic relief Follow up with PCP next week for recheck and/or if symptoms persists Return or go to ER if you have any new or worsening symptoms such as fever, chills, fatigue, shortness of breath, wheezing, chest pain, nausea, changes in bowel or bladder habits, etc..Marland Kitchen

## 2020-04-23 NOTE — ED Triage Notes (Signed)
Hx of sinus infection 3 weeks ago and was placed on zithromax and prednisone and cough medication.  States she is not feeling any better and has been having to use her oxygen at home due to coughing.  Productive cough with white sputum.

## 2020-04-25 ENCOUNTER — Telehealth: Payer: Self-pay | Admitting: Pulmonary Disease

## 2020-04-25 MED ORDER — AMOXICILLIN-POT CLAVULANATE 875-125 MG PO TABS: 1.0000 | ORAL_TABLET | Freq: Two times a day (BID) | ORAL | 0 refills | Status: DC

## 2020-04-25 MED ORDER — PREDNISONE 20 MG PO TABS: 20.0000 mg | ORAL_TABLET | Freq: Every day | ORAL | 0 refills | Status: DC

## 2020-04-25 MED ORDER — IPRATROPIUM-ALBUTEROL 0.5-2.5 (3) MG/3ML IN SOLN: 3.0000 mL | Freq: Four times a day (QID) | RESPIRATORY_TRACT | 6 refills | Status: DC | PRN

## 2020-04-25 NOTE — Telephone Encounter (Signed)
Pred 20 mg daily for 10 days AUG 875 BID for 10 days

## 2020-04-25 NOTE — Telephone Encounter (Signed)
Patient is aware of below recommendations and voiced her understanding.  Rx for prednisone and Augmentin has been sent to preferred pharmacy.  Patient is aware to hold cefdinir and prednisone taper.  appt scheduled for 05/31/2020 with TP. Nothing further is needed at this time.

## 2020-04-25 NOTE — Telephone Encounter (Signed)
Patient last seen 03/2019. No pending appt. Patient seen at [facility] on 04/23/2020 for cough. Currently taking cefdinir 300 and prednisone taper.  She reports of sob with exertion, wheezing and prod cough with yellow sputum. Sx have been present for 3 weeks.  Denies f/c/s Using albuterol nebulizer solution, HFA Q4H and pulmicort with no relief in sx.  Not vaccinated against covid. She has had her flu shot.   Dr. Mortimer Fries, please advise as LG is unavailable.

## 2020-05-25 ENCOUNTER — Telehealth: Payer: Self-pay

## 2020-05-25 NOTE — Telephone Encounter (Signed)
Patient contacted and is scheduled for Tuesday May 24th at 4:30 for lung cancer screening CT scan. She states that she still has [employer] (will bring card to appt). She is smoking 6 cigerettes per day. Wants a text with appt time date and phone # and address just in case but states that she remembers where to go.

## 2020-05-25 NOTE — Telephone Encounter (Signed)
Patient states that she wears a "life vest"

## 2020-05-27 ENCOUNTER — Other Ambulatory Visit: Payer: Self-pay | Admitting: *Deleted

## 2020-05-27 DIAGNOSIS — F17200 Nicotine dependence, unspecified, uncomplicated: Secondary | ICD-10-CM

## 2020-05-27 DIAGNOSIS — Z122 Encounter for screening for malignant neoplasm of respiratory organs: Secondary | ICD-10-CM

## 2020-05-27 DIAGNOSIS — Z87891 Personal history of nicotine dependence: Secondary | ICD-10-CM

## 2020-05-27 NOTE — Progress Notes (Signed)
Contacted and scheduled for annual lung screening scan. Patient is a current smoker with a 46.25 pack year history.

## 2020-05-31 ENCOUNTER — Encounter: Payer: Self-pay | Admitting: Adult Health

## 2020-05-31 ENCOUNTER — Ambulatory Visit (INDEPENDENT_AMBULATORY_CARE_PROVIDER_SITE_OTHER): Payer: Medicaid Other | Admitting: Adult Health

## 2020-05-31 ENCOUNTER — Ambulatory Visit
Admission: RE | Admit: 2020-05-31 | Discharge: 2020-05-31 | Disposition: A | Discharge status: Home / Self Care | Payer: Medicaid Other | Source: Ambulatory Visit | Attending: Nurse Practitioner | Admitting: Nurse Practitioner

## 2020-05-31 ENCOUNTER — Telehealth: Payer: Self-pay | Admitting: Adult Health

## 2020-05-31 ENCOUNTER — Other Ambulatory Visit: Payer: Self-pay

## 2020-05-31 DIAGNOSIS — Z87891 Personal history of nicotine dependence: Secondary | ICD-10-CM

## 2020-05-31 DIAGNOSIS — J189 Pneumonia, unspecified organism: Secondary | ICD-10-CM

## 2020-05-31 DIAGNOSIS — F17208 Nicotine dependence, unspecified, with other nicotine-induced disorders: Secondary | ICD-10-CM

## 2020-05-31 DIAGNOSIS — J441 Chronic obstructive pulmonary disease with (acute) exacerbation: Secondary | ICD-10-CM

## 2020-05-31 DIAGNOSIS — I2510 Atherosclerotic heart disease of native coronary artery without angina pectoris: Secondary | ICD-10-CM | POA: Insufficient documentation

## 2020-05-31 DIAGNOSIS — F17200 Nicotine dependence, unspecified, uncomplicated: Secondary | ICD-10-CM

## 2020-05-31 DIAGNOSIS — I700 Atherosclerosis of aorta: Secondary | ICD-10-CM | POA: Insufficient documentation

## 2020-05-31 DIAGNOSIS — F17210 Nicotine dependence, cigarettes, uncomplicated: Secondary | ICD-10-CM | POA: Diagnosis not present

## 2020-05-31 DIAGNOSIS — Z122 Encounter for screening for malignant neoplasm of respiratory organs: Secondary | ICD-10-CM | POA: Diagnosis present

## 2020-05-31 DIAGNOSIS — J439 Emphysema, unspecified: Secondary | ICD-10-CM | POA: Insufficient documentation

## 2020-05-31 DIAGNOSIS — R55 Syncope and collapse: Secondary | ICD-10-CM | POA: Insufficient documentation

## 2020-05-31 DIAGNOSIS — J9611 Chronic respiratory failure with hypoxia: Secondary | ICD-10-CM | POA: Insufficient documentation

## 2020-05-31 NOTE — Assessment & Plan Note (Signed)
Continue on oxygen 2 L with activity and at bedtime O2 saturation goals are greater than 88 to 90%.

## 2020-05-31 NOTE — Patient Instructions (Addendum)
Continue Pulmicort Neb Twice daily  .  Continue on Duoneb Four times a day  .  Continue on Oxygen 2l/m with activity and At bedtime   Follow up with Cardiology this week as planned.  CT chest today as planned.  Work on not smoking  Mucinex DM Twice daily  As needed  Cough/congestion  Follow up with Dr Patsey Berthold in 3-4 weeks and As needed   Please contact office for sooner follow up if symptoms do not improve or worsen or seek emergency care

## 2020-05-31 NOTE — Assessment & Plan Note (Signed)
Smoking cessation discussed 

## 2020-05-31 NOTE — Telephone Encounter (Signed)
Medical records request has been faxed to [facility] center.

## 2020-05-31 NOTE — Assessment & Plan Note (Signed)
Reported syncopal collapse.  Records have been requested from recent hospitalization.  Is unclear if patient had a cardiac arrest received CPR but she does have a [place].  She does have a follow-up with cardiology later this week.  Patient is advised to keep this appointment.

## 2020-05-31 NOTE — Assessment & Plan Note (Signed)
Patient has completed a full course of antibiotics.  Records from recent hospitalization have been requested.  Patient has a CT chest scheduled for later today.  We will further evaluate on scans.

## 2020-05-31 NOTE — Progress Notes (Addendum)
@Patient  ID: Shannon Russell, female    DOB: 19-Aug-1960, 60 y.o.   MRN: [id number]  Chief Complaint  Patient presents with  . Follow-up    Referring provider: [facility]  HPI: 60 year old female active smoker followed for COPD  TEST/EVENTS : \ Low-dose CT chest March 2021 lung RADS 1, mild emphysema.  Calcified left upper lobe granuloma.  Right middle lobe and left lung base scarring  Pulmonary function test April 2021 FEV1 61%, ratio 79, FVC 60%, no significant bronchodilator response, DLCO is 44%  05/31/2020 Follow up : COPD , Smoker  Patient presents for a 1 year follow-up.  Patient is followed for COPD with emphysema. She remains on Pulmicort Neb Twice daily  , Duoneb 3-4 times a day .  Patient does continue smoke.  Smoking cessation was discussed.  No hemoptysis or chest pain or edema. Has Low dose CT chest for later today.   She does tell me that she was recently Visiting in [place], was hospitalized for Pneumonia . Treated with antibiotics and steroids . Discharged 4 days ago. Feeling better, but remains weak. Finished all antibiotics and steroids now.  Appetite is low but no n/v/d.  No records available from recent hospital stay .  Per patient it sounds as if she collapsed required intubation and vent support for couple of days.  Was placed on [employer], patient unclear if with new Cardiac dx .  We have requested records from her hospital stay in [place]  On Oxygen 2l/m with activity and At bedtime  .   Allergies  Allergen Reactions  . Advair Diskus [Fluticasone-Salmeterol] Hives  . Iodine Rash    Other reaction(s): Skin Rashes, Hives    Immunization History  Administered Date(s) Administered  . Influenza,inj,Quad PF,6+ Mos 09/22/2012  . Pneumococcal Polysaccharide-23 09/22/2012    Past Medical History:  Diagnosis Date  . Arthritis   . Asthma   . Cancer ([place])   . COPD (chronic obstructive pulmonary disease) ([place])   . Depression   .  Diabetes mellitus without complication ([place])   . H/O blood clots   . PAD (peripheral artery disease) ([place])   . Thyroid disease     Tobacco History: Social History   Tobacco Use  Smoking Status Current Every Day Smoker  . Packs/day: 0.25  . Years: 45.00  . Pack years: 11.25  . Types: Cigarettes  Smokeless Tobacco Never Used  Tobacco Comment   6 ciggs a day.   Ready to quit: No Counseling given: Yes Comment: 6 ciggs a day.   Outpatient Medications Prior to Visit  Medication Sig Dispense Refill  . ACCU-CHEK GUIDE test strip USE 1 STRIP TO CHECK GLUCOSE TWICE DAILY AS NEEDED    . albuterol (2.5 MG/3ML) 0.083% NEBU 3 mL, albuterol (5 MG/ML) 0.5% NEBU 0.5 mL Inhale 2.5 mg into the lungs every 4 (four) hours as needed (wheezing, SOB).    Marland Kitchen albuterol (PROVENTIL HFA;VENTOLIN HFA) 108 (90 Base) MCG/ACT inhaler Inhale 2 puffs into the lungs every 6 (six) hours as needed for wheezing.    Marland Kitchen atorvastatin (LIPITOR) 40 MG tablet Take 40 mg by mouth daily.    . budesonide (PULMICORT) 0.5 MG/2ML nebulizer solution Take 2 mLs (0.5 mg total) by nebulization 2 (two) times daily. 120 mL 6  . Cholecalciferol 1.25 MG (50000 UT) capsule Vitamin D3 1.25 MG (50000 UT) Oral Capsule QTY: 0 capsule Days: 0 Refills: 0  Written: 02/09/20 Patient Instructions: qaw    . clopidogrel (PLAVIX)  75 MG tablet Take 75 mg by mouth daily.    . famotidine (PEPCID) 20 MG tablet Take 1 tablet (20 mg total) by mouth at bedtime. 30 tablet 6  . fluticasone (FLONASE) 50 MCG/ACT nasal spray Place 1 spray into both nostrils daily.    . furosemide (LASIX) 20 MG tablet Take 1-2 tablets by mouth daily as needed.    . insulin glargine (LANTUS SOLOSTAR) 100 UNIT/ML Solostar Pen 16 Units at bedtime.    Marland Kitchen ipratropium-albuterol (DUONEB) 0.5-2.5 (3) MG/3ML SOLN Take 3 mLs by nebulization every 6 (six) hours as needed. 360 mL 6  . levothyroxine (SYNTHROID, LEVOTHROID) 137 MCG tablet Take 137 mcg by mouth daily before breakfast.     .  metFORMIN (GLUCOPHAGE-XR) 500 MG 24 hr tablet Take 1,000 mg by mouth at bedtime.    . pantoprazole (PROTONIX) 40 MG tablet Take 1 tablet (40 mg total) by mouth daily. 30 tablet 3  . pregabalin (LYRICA) 75 MG capsule Take 75 mg by mouth 3 (three) times daily.    Marland Kitchen VITAMIN D, ERGOCALCIFEROL, PO Take 1,000 Units by mouth 2 (two) times daily.    Marland Kitchen amoxicillin-clavulanate (AUGMENTIN) 875-125 MG tablet Take 1 tablet by mouth 2 (two) times daily. (Patient not taking: Reported on 05/31/2020) 20 tablet 0  . meloxicam (MOBIC) 15 MG tablet Take 15 mg by mouth daily. (Patient not taking: Reported on 05/31/2020)    . predniSONE (DELTASONE) 20 MG tablet Take 1 tablet (20 mg total) by mouth daily with breakfast. (Patient not taking: Reported on 05/31/2020) 10 tablet 0  . propranolol (INDERAL) 40 MG tablet Take 40 mg by mouth 2 (two) times daily. (Patient not taking: Reported on 05/31/2020)     No facility-administered medications prior to visit.     Review of Systems:   Constitutional:   No  weight loss, night sweats,  Fevers, chills,  +fatigue, or  lassitude.  HEENT:   No headaches,  Difficulty swallowing,  Tooth/dental problems, or  Sore throat,                No sneezing, itching, ear ache, nasal congestion, post nasal drip,   CV:  No chest pain,  Orthopnea, PND, swelling in lower extremities, anasarca, dizziness, palpitations, syncope.   GI  No heartburn, indigestion, abdominal pain, nausea, vomiting, diarrhea, change in bowel habits, loss of appetite, bloody stools.   Resp:  .  No chest wall deformity  Skin: no rash or lesions.  GU: no dysuria, change in color of urine, no urgency or frequency.  No flank pain, no hematuria   MS:  No joint pain or swelling.  No decreased range of motion.  No back pain.    Physical Exam  BP 110/78 (BP Location: Left Arm, Patient Position: Sitting, Cuff Size: Normal)   Pulse 71   Temp (!) 97.3 F (36.3 C) (Temporal)   Ht 5\' 3"  (1.6 m)   Wt 165 lb 3.2 oz  (74.9 kg)   SpO2 98%   BMI 29.26 kg/m   GEN: A/Ox3; pleasant , NAD, well nourished    HEENT:  [place]/AT,   NOSE-clear, THROAT-clear, no lesions, no postnasal drip or exudate noted.   NECK:  Supple w/ fair ROM; no JVD; normal carotid impulses w/o bruits; no thyromegaly or nodules palpated; no lymphadenopathy.    RESP  Clear  P & A; w/o, wheezes/ rales/ or rhonchi. no accessory muscle use, no dullness to percussion  CARD:  RRR, no m/r/g, no peripheral edema, pulses intact,  no cyanosis or clubbing. [place] life vest   GI:   Soft & nt; nml bowel sounds; no organomegaly or masses detected.   Musco: Warm bil, no deformities or joint swelling noted.   Neuro: alert, no focal deficits noted.    Skin: Warm, no lesions or rashes    Lab Results:   BNP  ProBNP No results found for: PROBNP  Imaging: No results found.    No flowsheet data found.  No results found for: NITRICOXIDE      Assessment & Plan:   COPD exacerbation ([place]) Recent COPD exacerbation with what sounds like a superimposed pneumonia and acute respiratory failure requiring vent support Patient is clinically improving.  She has a CT chest for later today. Continue on Pulmicort and DuoNeb maintenance regimen. Smoking cessation is key.  Plan  Patient Instructions  Continue Pulmicort Neb Twice daily  .  Continue on Duoneb Four times a day  .  Continue on Oxygen 2l/m with activity and At bedtime   Follow up with Cardiology this week as planned.  CT chest today as planned.  Work on not smoking  Mucinex DM Twice daily  As needed  Cough/congestion  Follow up with Dr Patsey Berthold in 3-4 weeks and As needed   Please contact office for sooner follow up if symptoms do not improve or worsen or seek emergency care        Nicotine dependence Smoking cessation discussed  Community acquired pneumonia Patient has completed a full course of antibiotics.  Records from recent hospitalization have been requested.  Patient  has a CT chest scheduled for later today.  We will further evaluate on scans.  Chronic respiratory failure with hypoxia ([facility]) Continue on oxygen 2 L with activity and at bedtime O2 saturation goals are greater than 88 to 90%.     Rexene Edison, NP 05/31/2020

## 2020-05-31 NOTE — Assessment & Plan Note (Signed)
Recent COPD exacerbation with what sounds like a superimposed pneumonia and acute respiratory failure requiring vent support Patient is clinically improving.  She has a CT chest for later today. Continue on Pulmicort and DuoNeb maintenance regimen. Smoking cessation is key.  Plan  Patient Instructions  Continue Pulmicort Neb Twice daily  .  Continue on Duoneb Four times a day  .  Continue on Oxygen 2l/m with activity and At bedtime   Follow up with Cardiology this week as planned.  CT chest today as planned.  Work on not smoking  Mucinex DM Twice daily  As needed  Cough/congestion  Follow up with Dr Patsey Berthold in 3-4 weeks and As needed   Please contact office for sooner follow up if symptoms do not improve or worsen or seek emergency care

## 2020-06-02 ENCOUNTER — Encounter (INDEPENDENT_AMBULATORY_CARE_PROVIDER_SITE_OTHER): Payer: Self-pay | Admitting: Vascular Surgery

## 2020-06-02 ENCOUNTER — Ambulatory Visit (INDEPENDENT_AMBULATORY_CARE_PROVIDER_SITE_OTHER): Payer: Medicaid Other | Admitting: Vascular Surgery

## 2020-06-02 ENCOUNTER — Other Ambulatory Visit: Payer: Self-pay

## 2020-06-02 VITALS — BP 104/70 | HR 72 | Resp 16 | Wt 166.0 lb

## 2020-06-02 DIAGNOSIS — E782 Mixed hyperlipidemia: Secondary | ICD-10-CM | POA: Diagnosis not present

## 2020-06-02 DIAGNOSIS — I739 Peripheral vascular disease, unspecified: Secondary | ICD-10-CM | POA: Diagnosis not present

## 2020-06-02 DIAGNOSIS — J449 Chronic obstructive pulmonary disease, unspecified: Secondary | ICD-10-CM

## 2020-06-02 DIAGNOSIS — E1151 Type 2 diabetes mellitus with diabetic peripheral angiopathy without gangrene: Secondary | ICD-10-CM

## 2020-06-02 DIAGNOSIS — K219 Gastro-esophageal reflux disease without esophagitis: Secondary | ICD-10-CM

## 2020-06-02 NOTE — Progress Notes (Signed)
MRN : [id number]  Shannon Russell is a 60 y.o. (04/11/1960) female who presents with chief complaint of  Chief Complaint  Patient presents with  . Follow-up    Ct results  .  History of Present Illness:   The patient returns to the office for followup and review status post CT angiogram. The patient notes significant pain in her lower extremity symptoms. She continues to note a short claudication distance with mild rest pain symptoms. No new ulcers or wounds have occurred since the last visit.  There have been no significant changes to the patient's overall health care.  The patient denies amaurosis fugax or recent TIA symptoms. There are no recent neurological changes noted. The patient denies history of DVT, PE or superficial thrombophlebitis. The patient denies recent episodes of angina or shortness of breath.   CT scan was done at [facility] and images are not available.  Report is faxed to my office for review and it shows the iliac artery stents are patent and the SFA and popliteal arteries are patent there is diffuse tibial disease bilaterally.  Current Meds  Medication Sig  . ACCU-CHEK GUIDE test strip USE 1 STRIP TO CHECK GLUCOSE TWICE DAILY AS NEEDED  . albuterol (2.5 MG/3ML) 0.083% NEBU 3 mL, albuterol (5 MG/ML) 0.5% NEBU 0.5 mL Inhale 2.5 mg into the lungs every 4 (four) hours as needed (wheezing, SOB).  Marland Kitchen albuterol (PROVENTIL HFA;VENTOLIN HFA) 108 (90 Base) MCG/ACT inhaler Inhale 2 puffs into the lungs every 6 (six) hours as needed for wheezing.  Marland Kitchen atorvastatin (LIPITOR) 40 MG tablet Take 40 mg by mouth daily.  . budesonide (PULMICORT) 0.5 MG/2ML nebulizer solution Take 2 mLs (0.5 mg total) by nebulization 2 (two) times daily.  . Cholecalciferol 1.25 MG (50000 UT) capsule Vitamin D3 1.25 MG (50000 UT) Oral Capsule QTY: 0 capsule Days: 0 Refills: 0  Written: 02/09/20 Patient Instructions: qaw  . clopidogrel (PLAVIX) 75 MG tablet Take 75 mg by mouth daily.  . famotidine  (PEPCID) 20 MG tablet Take 1 tablet (20 mg total) by mouth at bedtime.  . fluticasone (FLONASE) 50 MCG/ACT nasal spray Place 1 spray into both nostrils daily.  . furosemide (LASIX) 20 MG tablet Take 1-2 tablets by mouth daily as needed.  . insulin glargine (LANTUS SOLOSTAR) 100 UNIT/ML Solostar Pen 16 Units at bedtime.  Marland Kitchen ipratropium-albuterol (DUONEB) 0.5-2.5 (3) MG/3ML SOLN Take 3 mLs by nebulization every 6 (six) hours as needed.  Marland Kitchen levothyroxine (SYNTHROID, LEVOTHROID) 137 MCG tablet Take 137 mcg by mouth daily before breakfast.   . metFORMIN (GLUCOPHAGE-XR) 500 MG 24 hr tablet Take 1,000 mg by mouth at bedtime.  . pantoprazole (PROTONIX) 40 MG tablet Take 1 tablet (40 mg total) by mouth daily.  . pregabalin (LYRICA) 75 MG capsule Take 75 mg by mouth 3 (three) times daily.  Marland Kitchen VITAMIN D, ERGOCALCIFEROL, PO Take 1,000 Units by mouth 2 (two) times daily.    Past Medical History:  Diagnosis Date  . Arthritis   . Asthma   . Cancer ([place])   . COPD (chronic obstructive pulmonary disease) ([place])   . Depression   . Diabetes mellitus without complication ([place])   . H/O blood clots   . PAD (peripheral artery disease) ([place])   . Thyroid disease     Past Surgical History:  Procedure Laterality Date  . Bypass right leg  2015  . CHOLECYSTECTOMY  1981  . Mesh implant right leg  2015  . THYROIDECTOMY  2013  .  TUBAL LIGATION  1993    Social History Social History   Tobacco Use  . Smoking status: Current Every Day Smoker    Packs/day: 0.25    Years: 45.00    Pack years: 11.25    Types: Cigarettes  . Smokeless tobacco: Never Used  . Tobacco comment: 6 ciggs a day.  Vaping Use  . Vaping Use: Never used  Substance Use Topics  . Alcohol use: Not Currently  . Drug use: Not Currently    Family History Family History  Problem Relation Age of Onset  . Heart failure Mother   . Depression Mother   . Peripheral Artery Disease Sister   . Cancer Sister   . Clotting disorder Sister   .  Depression Sister   . Thyroid disease Sister     Allergies  Allergen Reactions  . Advair Diskus [Fluticasone-Salmeterol] Hives  . Iodine Rash    Other reaction(s): Skin Rashes, Hives     REVIEW OF SYSTEMS (Negative unless checked)  Constitutional: [] Weight loss  [] Fever  [] Chills Cardiac: [] Chest pain   [] Chest pressure   [] Palpitations   [] Shortness of breath when laying flat   [] Shortness of breath with exertion. Vascular:  [x] Pain in legs with walking   [x] Pain in legs at rest  [] History of DVT   [] Phlebitis   [] Swelling in legs   [] Varicose veins   [] Non-healing ulcers Pulmonary:   [] Uses home oxygen   [] Productive cough   [] Hemoptysis   [] Wheeze  [] COPD   [] Asthma Neurologic:  [] Dizziness   [] Seizures   [] History of stroke   [] History of TIA  [] Aphasia   [] Vissual changes   [] Weakness or numbness in arm   [] Weakness or numbness in leg Musculoskeletal:   [] Joint swelling   [x] Joint pain   [x] Low back pain Hematologic:  [] Easy bruising  [] Easy bleeding   [] Hypercoagulable state   [] Anemic Gastrointestinal:  [] Diarrhea   [] Vomiting  [x] Gastroesophageal reflux/heartburn   [] Difficulty swallowing. Genitourinary:  [] Chronic kidney disease   [] Difficult urination  [] Frequent urination   [] Blood in urine Skin:  [] Rashes   [] Ulcers  Psychological:  [] History of anxiety   []  History of major depression.  Physical Examination  There were no vitals filed for this visit. There is no height or weight on file to calculate BMI. Gen: WD/WN, NAD Head: [place]/AT, No temporalis wasting.  Ear/Nose/Throat: Hearing grossly intact, nares w/o erythema or drainage Eyes: PER, EOMI, sclera nonicteric.  Neck: Supple, no large masses.   Pulmonary:  Good air movement, no audible wheezing bilaterally, no use of accessory muscles.  Cardiac: RRR, no JVD Vascular: scattered varicosities present bilaterally.  Mild venous stasis changes to the legs bilaterally.  2+ soft pitting edema. Vessel Right Left  Radial  Palpable Palpable  PT Not Palpable Not Palpable  DP Not Palpable Not Palpable  Gastrointestinal: Non-distended. No guarding/no peritoneal signs.  Musculoskeletal: M/S 5/5 throughout.  No deformity or atrophy.  Neurologic: CN 2-12 intact. Symmetrical.  Speech is fluent. Motor exam as listed above. Psychiatric: Judgment intact, Mood & affect appropriate for pt's clinical situation. Dermatologic: No rashes or ulcers noted.  No changes consistent with cellulitis. Lymph : No lichenification or skin changes of chronic lymphedema.  CBC Lab Results  Component Value Date   WBC 16.7 (H) 01/20/2019   HGB 15.9 (H) 01/20/2019   HCT 48.5 (H) 01/20/2019   MCV 94.0 01/20/2019   PLT 288 01/20/2019    BMET    Component Value Date/Time   NA 138  01/20/2019 0529   K 3.8 01/20/2019 0529   CL 103 01/20/2019 0529   CO2 26 01/20/2019 0529   GLUCOSE 184 (H) 01/20/2019 0529   BUN 13 01/20/2019 0529   CREATININE 0.95 01/20/2019 0529   CALCIUM 9.8 01/20/2019 0529   GFRNONAA >60 01/20/2019 0529   GFRAA >60 01/20/2019 0529   CrCl cannot be calculated (Patient's most recent lab result is older than the maximum 21 days allowed.).  COAG No results found for: INR, PROTIME  Radiology CT CHEST LUNG CANCER SCREENING LOW DOSE WO CONTRAST  Result Date: 06/02/2020 CLINICAL DATA:  Forty-seven pack-year smoking history. Current smoker. EXAM: CT CHEST WITHOUT CONTRAST LOW-DOSE FOR LUNG CANCER SCREENING TECHNIQUE: Multidetector CT imaging of the chest was performed following the standard protocol without IV contrast. COMPARISON:  03/11/2019 FINDINGS: Cardiovascular: Aortic atherosclerosis. 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 Normal heart size, without pericardial effusion. Lad coronary artery calcification. Mediastinum/Nodes: No mediastinal or definite hilar adenopathy, given limitations of unenhanced CT. Lungs/Pleura: No pleural fluid. Mild centrilobular emphysema.  Left upper lobe calcified granuloma again identified. No suspicious pulmonary nodule. Upper Abdomen: Cholecystectomy. Normal imaged portions of the liver, spleen, stomach, pancreas, adrenal glands, kidneys. Musculoskeletal: No acute osseous abnormality. IMPRESSION: 1. Lung-RADS 1, negative. Continue annual screening with low-dose chest CT without contrast in 12 months. 2. Aortic Atherosclerosis (ICD10-I70.0) and Emphysema (ICD10-J43.9). 3. Age advanced coronary artery atherosclerosis. Recommend assessment of coronary risk factors and consideration of medical therapy. Electronically Signed   By: Abigail Miyamoto M.D.   On: 06/02/2020 09:20     Assessment/Plan 1. PAD (peripheral artery disease) (HCC)  Recommend:  The patient has evidence of atherosclerosis of the lower extremities with claudication and pain in the legs.  The patient does voice lifestyle limiting changes at this point in time but given the CT results I am not certain that it is related exclusively to her ASO.  She is seeing her cardiologist next week and would like to see what his thoughts are.  No invasive studies, angiography or surgery ordered today.  The patient should continue walking and begin a more formal exercise program.  The patient should continue antiplatelet therapy and aggressive treatment of the lipid abnormalities  No changes in the patient's medications at this time   2. Type 2 diabetes mellitus with diabetic peripheral angiopathy without gangrene, without long-term current use of insulin (HCC) Continue hypoglycemic medications as already ordered, these medications have been reviewed and there are no changes at this time.  Hgb A1C to be monitored as already arranged by primary service   3. Chronic obstructive pulmonary disease, unspecified COPD type ([place]) Continue pulmonary medications and aerosols as already ordered, these medications have been reviewed and there are no changes at this time.    4. Mixed  hyperlipidemia Continue statin as ordered and reviewed, no changes at this time   5. Gastroesophageal reflux disease without esophagitis Continue PPI as already ordered, this medication has been reviewed and there are no changes at this time.  Avoidence of caffeine and alcohol  Moderate elevation of the head of the bed     [name], MD  06/02/2020 1:11 PM

## 2020-06-03 NOTE — Progress Notes (Signed)
Agree with the details of the visit as noted by Tammy Parrett, NP.  C. Laura Sharece Fleischhacker, MD [facility] 

## 2020-06-05 ENCOUNTER — Encounter (INDEPENDENT_AMBULATORY_CARE_PROVIDER_SITE_OTHER): Payer: Self-pay | Admitting: Vascular Surgery

## 2020-06-07 ENCOUNTER — Emergency Department: Payer: Medicaid Other

## 2020-06-07 ENCOUNTER — Observation Stay
Admission: EM | Admit: 2020-06-07 | Discharge: 2020-06-09 | Disposition: A | Discharge status: Home / Self Care | Payer: Medicaid Other | Attending: Internal Medicine | Admitting: Internal Medicine

## 2020-06-07 DIAGNOSIS — Z7902 Long term (current) use of antithrombotics/antiplatelets: Secondary | ICD-10-CM | POA: Diagnosis not present

## 2020-06-07 DIAGNOSIS — J449 Chronic obstructive pulmonary disease, unspecified: Secondary | ICD-10-CM | POA: Diagnosis present

## 2020-06-07 DIAGNOSIS — Z7984 Long term (current) use of oral hypoglycemic drugs: Secondary | ICD-10-CM | POA: Diagnosis not present

## 2020-06-07 DIAGNOSIS — F17210 Nicotine dependence, cigarettes, uncomplicated: Secondary | ICD-10-CM | POA: Insufficient documentation

## 2020-06-07 DIAGNOSIS — J9611 Chronic respiratory failure with hypoxia: Secondary | ICD-10-CM | POA: Diagnosis present

## 2020-06-07 DIAGNOSIS — Z20822 Contact with and (suspected) exposure to covid-19: Secondary | ICD-10-CM | POA: Insufficient documentation

## 2020-06-07 DIAGNOSIS — I493 Ventricular premature depolarization: Secondary | ICD-10-CM

## 2020-06-07 DIAGNOSIS — R55 Syncope and collapse: Principal | ICD-10-CM | POA: Diagnosis present

## 2020-06-07 DIAGNOSIS — Z79899 Other long term (current) drug therapy: Secondary | ICD-10-CM | POA: Diagnosis not present

## 2020-06-07 DIAGNOSIS — I959 Hypotension, unspecified: Secondary | ICD-10-CM | POA: Diagnosis present

## 2020-06-07 DIAGNOSIS — J45909 Unspecified asthma, uncomplicated: Secondary | ICD-10-CM | POA: Diagnosis not present

## 2020-06-07 DIAGNOSIS — E119 Type 2 diabetes mellitus without complications: Secondary | ICD-10-CM | POA: Insufficient documentation

## 2020-06-07 DIAGNOSIS — Z794 Long term (current) use of insulin: Secondary | ICD-10-CM | POA: Diagnosis not present

## 2020-06-07 DIAGNOSIS — I739 Peripheral vascular disease, unspecified: Secondary | ICD-10-CM | POA: Diagnosis not present

## 2020-06-07 LAB — CBC WITH DIFFERENTIAL/PLATELET
Abs Immature Granulocytes: 0.04 10*3/uL (ref 0.00–0.07)
Basophils Absolute: 0 10*3/uL (ref 0.0–0.1)
Basophils Relative: 0 %
Eosinophils Absolute: 0.2 10*3/uL (ref 0.0–0.5)
Eosinophils Relative: 2 %
HCT: 31.4 % — ABNORMAL LOW (ref 36.0–46.0)
Hemoglobin: 10.6 g/dL — ABNORMAL LOW (ref 12.0–15.0)
Immature Granulocytes: 1 %
Lymphocytes Relative: 28 %
Lymphs Abs: 2.3 10*3/uL (ref 0.7–4.0)
MCH: 30.8 pg (ref 26.0–34.0)
MCHC: 33.8 g/dL (ref 30.0–36.0)
MCV: 91.3 fL (ref 80.0–100.0)
Monocytes Absolute: 0.5 10*3/uL (ref 0.1–1.0)
Monocytes Relative: 5 %
Neutro Abs: 5.3 10*3/uL (ref 1.7–7.7)
Neutrophils Relative %: 64 %
Platelets: 215 10*3/uL (ref 150–400)
RBC: 3.44 MIL/uL — ABNORMAL LOW (ref 3.87–5.11)
RDW: 14.6 % (ref 11.5–15.5)
WBC: 8.3 10*3/uL (ref 4.0–10.5)
nRBC: 0 % (ref 0.0–0.2)

## 2020-06-07 LAB — URINALYSIS, COMPLETE (UACMP) WITH MICROSCOPIC
Bilirubin Urine: NEGATIVE
Glucose, UA: NEGATIVE mg/dL
Hgb urine dipstick: NEGATIVE
Ketones, ur: NEGATIVE mg/dL
Leukocytes,Ua: NEGATIVE
Nitrite: NEGATIVE
Protein, ur: NEGATIVE mg/dL
Specific Gravity, Urine: 1.003 — ABNORMAL LOW (ref 1.005–1.030)
pH: 6 (ref 5.0–8.0)

## 2020-06-07 LAB — COMPREHENSIVE METABOLIC PANEL
ALT: 22 U/L (ref 0–44)
AST: 20 U/L (ref 15–41)
Albumin: 2.5 g/dL — ABNORMAL LOW (ref 3.5–5.0)
Alkaline Phosphatase: 82 U/L (ref 38–126)
Anion gap: 10 (ref 5–15)
BUN: 25 mg/dL — ABNORMAL HIGH (ref 6–20)
CO2: 24 mmol/L (ref 22–32)
Calcium: 7.2 mg/dL — ABNORMAL LOW (ref 8.9–10.3)
Chloride: 104 mmol/L (ref 98–111)
Creatinine, Ser: 1.13 mg/dL — ABNORMAL HIGH (ref 0.44–1.00)
GFR, Estimated: 56 mL/min — ABNORMAL LOW (ref >60)
Glucose, Bld: 96 mg/dL (ref 70–99)
Potassium: 3.3 mmol/L — ABNORMAL LOW (ref 3.5–5.1)
Sodium: 138 mmol/L (ref 135–145)
Total Bilirubin: 0.3 mg/dL (ref 0.3–1.2)
Total Protein: 5.3 g/dL — ABNORMAL LOW (ref 6.5–8.1)

## 2020-06-07 LAB — MAGNESIUM: Magnesium: 1.1 mg/dL — ABNORMAL LOW (ref 1.7–2.4)

## 2020-06-07 LAB — TROPONIN I (HIGH SENSITIVITY)
Troponin I (High Sensitivity): 14 ng/L (ref <18)
Troponin I (High Sensitivity): 10 ng/L (ref <18)

## 2020-06-07 LAB — PHOSPHORUS: Phosphorus: 2.2 mg/dL — ABNORMAL LOW (ref 2.5–4.6)

## 2020-06-07 LAB — CBG MONITORING, ED: Glucose-Capillary: 133 mg/dL — ABNORMAL HIGH (ref 70–99)

## 2020-06-07 MED ORDER — PREGABALIN 75 MG PO CAPS
75.0000 mg | ORAL_CAPSULE | Freq: Three times a day (TID) | ORAL | Status: DC
  Administered 2020-06-07 – 2020-06-08 (×4): 75 mg via ORAL
  Filled 2020-06-07: qty 3
  Filled 2020-06-07 (×2): qty 1
  Filled 2020-06-07: qty 3

## 2020-06-07 MED ORDER — INSULIN ASPART 100 UNIT/ML IJ SOLN: 0.0000 [IU] | Freq: Three times a day (TID) | INTRAMUSCULAR | Status: DC

## 2020-06-07 MED ORDER — CLOPIDOGREL BISULFATE 75 MG PO TABS
75.0000 mg | ORAL_TABLET | Freq: Every day | ORAL | Status: DC
  Administered 2020-06-08: 75 mg via ORAL
  Filled 2020-06-07: qty 1

## 2020-06-07 MED ORDER — ATORVASTATIN CALCIUM 20 MG PO TABS
40.0000 mg | ORAL_TABLET | Freq: Every day | ORAL | Status: DC
  Administered 2020-06-08: 40 mg via ORAL
  Filled 2020-06-07 (×2): qty 2

## 2020-06-07 MED ORDER — FLUTICASONE PROPIONATE 50 MCG/ACT NA SUSP: 1.0000 | Freq: Every day | NASAL | Status: DC

## 2020-06-07 MED ORDER — ENOXAPARIN SODIUM 40 MG/0.4ML IJ SOSY
40.0000 mg | PREFILLED_SYRINGE | Freq: Every day | INTRAMUSCULAR | Status: DC
  Administered 2020-06-08: 40 mg via SUBCUTANEOUS
  Filled 2020-06-07: qty 0.4

## 2020-06-07 MED ORDER — ONDANSETRON HCL 4 MG/2ML IJ SOLN: 4.0000 mg | Freq: Four times a day (QID) | INTRAMUSCULAR | Status: DC | PRN

## 2020-06-07 MED ORDER — POLYETHYLENE GLYCOL 3350 17 G PO PACK: 17.0000 g | PACK | Freq: Every day | ORAL | Status: DC | PRN

## 2020-06-07 MED ORDER — ACETAMINOPHEN 650 MG RE SUPP: 650.0000 mg | Freq: Four times a day (QID) | RECTAL | Status: DC | PRN

## 2020-06-07 MED ORDER — ACETAMINOPHEN 325 MG PO TABS: 650.0000 mg | ORAL_TABLET | Freq: Four times a day (QID) | ORAL | Status: DC | PRN

## 2020-06-07 MED ORDER — MAGNESIUM SULFATE 2 GM/50ML IV SOLN
2.0000 g | Freq: Once | INTRAVENOUS | Status: AC
  Administered 2020-06-07: 2 g via INTRAVENOUS
  Filled 2020-06-07: qty 50

## 2020-06-07 MED ORDER — ALBUTEROL SULFATE (2.5 MG/3ML) 0.083% IN NEBU: 3.0000 mL | INHALATION_SOLUTION | Freq: Four times a day (QID) | RESPIRATORY_TRACT | Status: DC | PRN

## 2020-06-07 MED ORDER — LEVOTHYROXINE SODIUM 137 MCG PO TABS
137.0000 ug | ORAL_TABLET | Freq: Every day | ORAL | Status: DC
  Administered 2020-06-08 – 2020-06-09 (×2): 137 ug via ORAL
  Filled 2020-06-07 (×2): qty 1

## 2020-06-07 MED ORDER — SODIUM CHLORIDE 0.9% FLUSH
3.0000 mL | Freq: Two times a day (BID) | INTRAVENOUS | Status: DC
  Administered 2020-06-07 – 2020-06-08 (×2): 3 mL via INTRAVENOUS

## 2020-06-07 MED ORDER — SODIUM CHLORIDE 0.9 % IV BOLUS
500.0000 mL | Freq: Once | INTRAVENOUS | Status: AC
  Administered 2020-06-07: 500 mL via INTRAVENOUS

## 2020-06-07 MED ORDER — BUDESONIDE 0.5 MG/2ML IN SUSP
0.5000 mg | Freq: Two times a day (BID) | RESPIRATORY_TRACT | Status: DC
Start: 2020-06-07 — End: 2020-06-09
  Administered 2020-06-07 – 2020-06-09 (×3): 0.5 mg via RESPIRATORY_TRACT
  Filled 2020-06-07 (×3): qty 2

## 2020-06-07 MED ORDER — ONDANSETRON HCL 4 MG PO TABS: 4.0000 mg | ORAL_TABLET | Freq: Four times a day (QID) | ORAL | Status: DC | PRN

## 2020-06-07 MED ORDER — IPRATROPIUM-ALBUTEROL 0.5-2.5 (3) MG/3ML IN SOLN: 3.0000 mL | Freq: Four times a day (QID) | RESPIRATORY_TRACT | Status: DC | PRN

## 2020-06-07 MED ORDER — POTASSIUM CHLORIDE CRYS ER 20 MEQ PO TBCR
40.0000 meq | EXTENDED_RELEASE_TABLET | Freq: Once | ORAL | Status: AC
  Administered 2020-06-07: 40 meq via ORAL
  Filled 2020-06-07: qty 2

## 2020-06-07 NOTE — ED Provider Notes (Signed)
[facility] Emergency Department Provider Note ____________________________________________   Event Date/Time   First MD Initiated Contact with Patient 06/07/20 1409     (approximate)  I have reviewed the triage vital signs and the nursing notes.   HISTORY  Chief Complaint Loss of Consciousness  HPI Shannon Russell is a 60 y.o. female with history of COPD, diabetes, chronic respiratory failure with hypoxia on Plavix presents to the emergency department for treatment and evaluation after 2 falls and syncopal episodes today. She hit her head the second time, but denies loss of consciousness or headache. She is wearing a defibrillator life vest due to EF of 15%. EMS report that her blood pressure while standing was in the 29H systolic and slightly improved after sitting/lying down and 572ml of NS. Multiple PVCs on ECG. One tracing was a-fib with RVR then back into sinus rhythm with PVCs. She was reluctant to come to the hospital, but was convinced by her family and EMS.          Past Medical History:  Diagnosis Date  . Arthritis   . Asthma   . Cancer ([place])   . COPD (chronic obstructive pulmonary disease) ([place])   . Depression   . Diabetes mellitus without complication ([place])   . H/O blood clots   . PAD (peripheral artery disease) ([place])   . Thyroid disease     Patient Active Problem List   Diagnosis Date Noted  . Syncope 06/07/2020  . Hypotension 06/07/2020  . Hypomagnesemia 06/07/2020  . Community acquired pneumonia 05/31/2020  . Chronic respiratory failure with hypoxia ([place]) 05/31/2020  . Syncope and collapse 05/31/2020  . GERD (gastroesophageal reflux disease) 03/23/2020  . Hyperlipidemia 03/23/2020  . COPD (chronic obstructive pulmonary disease) ([place]) 04/20/2018  . Nicotine dependence 04/20/2018  . Type 2 diabetes mellitus with diabetic peripheral angiopathy without gangrene ([place]) 04/20/2018  . PAD (peripheral artery disease) ([place]) 04/20/2018     Past Surgical History:  Procedure Laterality Date  . Bypass right leg  2015  . CHOLECYSTECTOMY  1981  . Mesh implant right leg  2015  . THYROIDECTOMY  2013  . TUBAL LIGATION  1993    Prior to Admission medications   Medication Sig Start Date End Date Taking? Authorizing Provider  albuterol (2.5 MG/3ML) 0.083% NEBU 3 mL, albuterol (5 MG/ML) 0.5% NEBU 0.5 mL Inhale 2.5 mg into the lungs every 4 (four) hours as needed (wheezing, SOB).    [provider]  albuterol (PROVENTIL HFA;VENTOLIN HFA) 108 (90 Base) MCG/ACT inhaler Inhale 2 puffs into the lungs every 6 (six) hours as needed for wheezing.    [provider]  amoxicillin-clavulanate (AUGMENTIN) 875-125 MG tablet Take 1 tablet by mouth 2 (two) times daily. Patient not taking: No sig reported 04/25/20   Flora Lipps, MD  atorvastatin (LIPITOR) 40 MG tablet Take 40 mg by mouth daily.    [provider]  budesonide (PULMICORT) 0.5 MG/2ML nebulizer solution Take 2 mLs (0.5 mg total) by nebulization 2 (two) times daily. 02/05/19   Tyler Pita, MD  Cholecalciferol 1.25 MG (50000 UT) capsule Vitamin D3 1.25 MG (50000 UT) Oral Capsule QTY: 0 capsule Days: 0 Refills: 0  Written: 02/09/20 Patient Instructions: qaw 02/09/20   [provider]  clopidogrel (PLAVIX) 75 MG tablet Take 75 mg by mouth daily.    [provider]  famotidine (PEPCID) 20 MG tablet Take 1 tablet (20 mg total) by mouth at bedtime. 04/08/19   Martyn Ehrich, NP  fluticasone (FLONASE) 50 MCG/ACT nasal spray Place 1 spray into both nostrils daily. 01/13/19   [provider]  furosemide (LASIX) 20 MG tablet Take 1-2 tablets by mouth daily as needed. 01/10/19   [provider]  insulin glargine (LANTUS SOLOSTAR) 100 UNIT/ML Solostar Pen 16 Units at bedtime. 02/26/20   [provider]  ipratropium-albuterol (DUONEB) 0.5-2.5 (3) MG/3ML SOLN Take 3 mLs by nebulization every 6 (six) hours as needed. 04/25/20    Flora Lipps, MD  levothyroxine (SYNTHROID, LEVOTHROID) 137 MCG tablet Take 137 mcg by mouth daily before breakfast.     [provider]  meloxicam (MOBIC) 15 MG tablet Take 15 mg by mouth daily. Patient not taking: No sig reported    [provider]  metFORMIN (GLUCOPHAGE-XR) 500 MG 24 hr tablet Take 1,000 mg by mouth at bedtime. 01/13/19   [provider]  pantoprazole (PROTONIX) 40 MG tablet Take 1 tablet (40 mg total) by mouth daily. 03/04/19   Tyler Pita, MD  predniSONE (DELTASONE) 20 MG tablet Take 1 tablet (20 mg total) by mouth daily with breakfast. Patient not taking: No sig reported 04/25/20   Flora Lipps, MD  pregabalin (LYRICA) 75 MG capsule Take 75 mg by mouth 3 (three) times daily.    [provider]  propranolol (INDERAL) 40 MG tablet Take 40 mg by mouth 2 (two) times daily. Patient not taking: No sig reported    [provider]  VITAMIN D, ERGOCALCIFEROL, PO Take 1,000 Units by mouth 2 (two) times daily.    [provider]    Allergies Advair diskus [fluticasone-salmeterol] and Iodine  Family History  Problem Relation Age of Onset  . Heart failure Mother   . Depression Mother   . Peripheral Artery Disease Sister   . Cancer Sister   . Clotting disorder Sister   . Depression Sister   . Thyroid disease Sister     Social History Social History   Tobacco Use  . Smoking status: Current Every Day Smoker    Packs/day: 0.25    Years: 45.00    Pack years: 11.25    Types: Cigarettes  . Smokeless tobacco: Never Used  . Tobacco comment: 6 ciggs a day.  Vaping Use  . Vaping Use: Never used  Substance Use Topics  . Alcohol use: Not Currently  . Drug use: Not Currently    Review of Systems  Constitutional: No fever/chills Eyes: No visual changes. ENT: No sore throat. Cardiovascular: Denies chest pain. Respiratory: Denies shortness of breath. Gastrointestinal: No abdominal pain.  No nausea, no vomiting.  No  diarrhea.  No constipation. Genitourinary: Negative for dysuria. Musculoskeletal: Negative for back pain. Skin: Negative for rash. Neurological: Negative for headaches, focal weakness or numbness. ____________________________________________   PHYSICAL EXAM:  VITAL SIGNS: ED Triage Vitals  Enc Vitals Group     BP 06/07/20 1405 110/77     Pulse Rate 06/07/20 1405 92     Resp 06/07/20 1405 16     Temp 06/07/20 1405 98.3 F (36.8 C)     Temp Source 06/07/20 1405 Oral     SpO2 06/07/20 1405 93 %     Weight 06/07/20 1406 164 lb (74.4 kg)     Height 06/07/20 1406 5\' 3"  (1.6 m)     Head Circumference --      Peak Flow --      Pain Score 06/07/20 1406 0     Pain Loc --      Pain Edu? --  Excl. in [place]? --     Constitutional: Alert and oriented. Well appearing and in no acute distress. Eyes: Conjunctivae are normal. PERRL. EOMI. Head: Atraumatic. Nose: No congestion/rhinnorhea. Mouth/Throat: Mucous membranes are moist.  Oropharynx non-erythematous. Neck: No stridor.   Hematological/Lymphatic/Immunilogical: No cervical lymphadenopathy. Cardiovascular: Normal rate, regular rhythm. Grossly normal heart sounds.  Good peripheral circulation. Respiratory: Normal respiratory effort.  No retractions. Lungs CTAB. Gastrointestinal: Soft and nontender. No distention. No abdominal bruits. No CVA tenderness. Genitourinary:  Musculoskeletal: No lower extremity tenderness nor edema.  No joint effusions. Neurologic:  Normal speech and language. No gross focal neurologic deficits are appreciated. No gait instability. Skin:  Skin is warm, dry and intact. No rash noted. Psychiatric: Mood and affect are normal. Speech and behavior are normal.  ____________________________________________   LABS (all labs ordered are listed, but only abnormal results are displayed)  Labs Reviewed  CBC WITH DIFFERENTIAL/PLATELET - Abnormal; Notable for the following components:      Result Value   RBC 3.44  (*)    Hemoglobin 10.6 (*)    HCT 31.4 (*)    All other components within normal limits  COMPREHENSIVE METABOLIC PANEL - Abnormal; Notable for the following components:   Potassium 3.3 (*)    BUN 25 (*)    Creatinine, Ser 1.13 (*)    Calcium 7.2 (*)    Total Protein 5.3 (*)    Albumin 2.5 (*)    GFR, Estimated 56 (*)    All other components within normal limits  URINALYSIS, COMPLETE (UACMP) WITH MICROSCOPIC - Abnormal; Notable for the following components:   Color, Urine STRAW (*)    APPearance CLEAR (*)    Specific Gravity, Urine 1.003 (*)    Bacteria, UA RARE (*)    All other components within normal limits  MAGNESIUM - Abnormal; Notable for the following components:   Magnesium 1.1 (*)    All other components within normal limits  PHOSPHORUS - Abnormal; Notable for the following components:   Phosphorus 2.2 (*)    All other components within normal limits  HIV ANTIBODY (ROUTINE TESTING W REFLEX)  CBC WITH DIFFERENTIAL/PLATELET  BASIC METABOLIC PANEL  MAGNESIUM  PHOSPHORUS  TROPONIN I (HIGH SENSITIVITY)  TROPONIN I (HIGH SENSITIVITY)   ____________________________________________  EKG  ED ECG REPORT I, Sarh Kirschenbaum, FNP-BC personally viewed and interpreted this ECG.   Date: 06/07/2020  EKG Time: 1410  Rate: 95  Rhythm: normal sinus rhythm with frequent PVCs.  Axis: normal  Intervals:none  ST&T Change: no ST elevation  ____________________________________________  RADIOLOGY  ED MD interpretation:    Chest x-ray is negative for acute cardiopulmonary abnormality.  CT head negative for acute concerns.  I, Sherrie George, personally viewed and evaluated these images (plain radiographs) as part of my medical decision making, as well as reviewing the written report by the radiologist.  Official radiology report(s): DG Chest 1 View  Result Date: 06/07/2020 CLINICAL DATA:  Syncope. EXAM: CHEST  1 VIEW COMPARISON:  CT 05/31/2000.  Chest x-ray 04/23/2020.  FINDINGS: Monitoring device noted over the chest. Heart size normal. Low lung volumes. No focal infiltrate. No pleural effusion or pneumothorax. IMPRESSION: Low lung volumes.  No acute cardiopulmonary disease. Electronically Signed   By: Marcello Moores  Register   On: 06/07/2020 14:35   CT Head Wo Contrast  Result Date: 06/07/2020 CLINICAL DATA:  Syncope EXAM: CT HEAD WITHOUT CONTRAST TECHNIQUE: Contiguous axial images were obtained from the base of the skull through the vertex without intravenous contrast. COMPARISON:  None. FINDINGS: Brain: No acute intracranial abnormality. Specifically, no hemorrhage, hydrocephalus, mass lesion, acute infarction, or significant intracranial injury. Vascular: No hyperdense vessel or unexpected calcification. Skull: No acute calvarial abnormality. Sinuses/Orbits: No acute findings Other: None IMPRESSION: No acute intracranial abnormality. Electronically Signed   By: Rolm Baptise M.D.   On: 06/07/2020 15:09    ____________________________________________   PROCEDURES  Procedure(s) performed (including Critical Care):  Procedures  ____________________________________________   INITIAL IMPRESSION / ASSESSMENT AND PLAN     60 year old female presenting to the emergency department for treatment and evaluation after having syncopal episodes at home.  See HPI for further details.  After removing her LifeVest, she was placed on pacer pads.  She did not receive a shock during her syncopal episodes.  DIFFERENTIAL DIAGNOSIS  Simple syncope; cardiac events; hypotension  ED COURSE  Clinical Course as of 06/07/20 1949  Tue Jun 07, 2020  1531 Hypotension noted 85/64. She is asymptomatic and states that her pressures art usually low. 524ml NS ordered. [CT]  1650 Repeat BP better 97/65. Fluid bolus held. [CT]    Clinical Course User Index [CT] Victorino Dike, FNP   Patient admitted via hospitalist service. Dr. Clayborn Bigness also consulted. No additional orders  received. ___________________________________________   FINAL CLINICAL IMPRESSION(S) / ED DIAGNOSES  Final diagnoses:  Syncope and collapse  Frequent PVCs     ED Discharge Orders    None       Shannon Russell was evaluated in Emergency Department on 06/07/2020 for the symptoms described in the history of present illness. She was evaluated in the context of the global COVID-19 pandemic, which necessitated consideration that the patient might be at risk for infection with the SARS-CoV-2 virus that causes COVID-19. Institutional protocols and algorithms that pertain to the evaluation of patients at risk for COVID-19 are in a state of rapid change based on information released by regulatory bodies including the CDC and federal and state organizations. These policies and algorithms were followed during the patient's care in the ED.   Note:  This document was prepared using Dragon voice recognition software and may include unintentional dictation errors.   Victorino Dike, FNP 06/07/20 1949    Duffy Bruce, MD 06/09/20 [phone]

## 2020-06-07 NOTE — H&P (Addendum)
History and Physical:    Shannon Russell   OZD:[id number] DOB: [birth year] DOA: 06/07/2020  Referring MD/provider: Rollene Fare, NP PCP: [facility]   Patient coming from: Home  Chief Complaint: Syncope  History of Present Illness:   Shannon Russell is a 60 y.o. female with medical history significant for chronic hypoxemic respiratory failure on 1.5 l/min oxygen, COPD, tobacco use disorder, PVD on Plavix, type II DM, chronic systolic CHF with reported EF of 15% on LifeVest, recent hospitalization for pneumonia complicated by acute respiratory failure requiring intubation and mechanical ventilation (this was about 1-1/2 weeks prior to admission to the hospital in [place]).  She was brought to the hospital because of syncopal episodes at home.  History was obtained with assistance from her husband at the bedside.  Reportedly, patient has about 4 syncopal episodes within the last 5 days.  Her first syncopal episode occurred about 5 days ago.  She has had 3 more syncopal episodes, today's episode being the fourth episode.  However, each time, patient had refused to come to the hospital.  Patient said that each syncopal episode was preceded by dizziness.  Reportedly, patient blacked out and lost consciousness albeit for short period of time.  Reportedly, when EMS picked her up, she was in rapid A. fib but then she converted spontaneously to normal sinus rhythm prior to coming to the ED.Marland Kitchen  No chest pain, palpitations, cough, wheezing, vomiting, diarrhea, abdominal pain, fever, chills, headache.  ED Course:  The patient was given 500 cc of normal saline bolus and 2 g of mag IV magnesium sulfate  ROS:   ROS all other systems reviewed were negative  Past Medical History:   Past Medical History:  Diagnosis Date  . Arthritis   . Asthma   . Cancer ([place])   . COPD (chronic obstructive pulmonary disease) ([place])   . Depression   . Diabetes mellitus without complication ([place])   .  H/O blood clots   . PAD (peripheral artery disease) ([place])   . Thyroid disease     Past Surgical History:   Past Surgical History:  Procedure Laterality Date  . Bypass right leg  2015  . CHOLECYSTECTOMY  1981  . Mesh implant right leg  2015  . THYROIDECTOMY  2013  . TUBAL LIGATION  1993    Social History:   Social History   Socioeconomic History  . Marital status: Legally Separated    Spouse name: Not on file  . Number of children: Not on file  . Years of education: Not on file  . Highest education level: Not on file  Occupational History  . Not on file  Tobacco Use  . Smoking status: Current Every Day Smoker    Packs/day: 0.25    Years: 45.00    Pack years: 11.25    Types: Cigarettes  . Smokeless tobacco: Never Used  . Tobacco comment: 6 ciggs a day.  Vaping Use  . Vaping Use: Never used  Substance and Sexual Activity  . Alcohol use: Not Currently  . Drug use: Not Currently  . Sexual activity: Not on file  Other Topics Concern  . Not on file  Social History Narrative  . Not on file   Social Determinants of Health   Financial Resource Strain: Not on file  Food Insecurity: Not on file  Transportation Needs: Not on file  Physical Activity: Not on file  Stress: Not on file  Social Connections: Not on file  Intimate Partner Violence: Not  on file    Allergies   Advair diskus [fluticasone-salmeterol] and Iodine  Family history:   Family History  Problem Relation Age of Onset  . Heart failure Mother   . Depression Mother   . Peripheral Artery Disease Sister   . Cancer Sister   . Clotting disorder Sister   . Depression Sister   . Thyroid disease Sister     Current Medications:   Prior to Admission medications   Medication Sig Start Date End Date Taking? Authorizing Provider  ACCU-CHEK GUIDE test strip USE 1 STRIP TO CHECK GLUCOSE TWICE DAILY AS NEEDED 12/14/19   [provider]  albuterol (2.5 MG/3ML) 0.083% NEBU 3 mL, albuterol (5 MG/ML)  0.5% NEBU 0.5 mL Inhale 2.5 mg into the lungs every 4 (four) hours as needed (wheezing, SOB).    [provider]  albuterol (PROVENTIL HFA;VENTOLIN HFA) 108 (90 Base) MCG/ACT inhaler Inhale 2 puffs into the lungs every 6 (six) hours as needed for wheezing.    [provider]  amoxicillin-clavulanate (AUGMENTIN) 875-125 MG tablet Take 1 tablet by mouth 2 (two) times daily. Patient not taking: No sig reported 04/25/20   Flora Lipps, MD  atorvastatin (LIPITOR) 40 MG tablet Take 40 mg by mouth daily.    [provider]  budesonide (PULMICORT) 0.5 MG/2ML nebulizer solution Take 2 mLs (0.5 mg total) by nebulization 2 (two) times daily. 02/05/19   Tyler Pita, MD  Cholecalciferol 1.25 MG (50000 UT) capsule Vitamin D3 1.25 MG (50000 UT) Oral Capsule QTY: 0 capsule Days: 0 Refills: 0  Written: 02/09/20 Patient Instructions: qaw 02/09/20   [provider]  clopidogrel (PLAVIX) 75 MG tablet Take 75 mg by mouth daily.    [provider]  famotidine (PEPCID) 20 MG tablet Take 1 tablet (20 mg total) by mouth at bedtime. 04/08/19   Martyn Ehrich, NP  fluticasone (FLONASE) 50 MCG/ACT nasal spray Place 1 spray into both nostrils daily. 01/13/19   [provider]  furosemide (LASIX) 20 MG tablet Take 1-2 tablets by mouth daily as needed. 01/10/19   [provider]  insulin glargine (LANTUS SOLOSTAR) 100 UNIT/ML Solostar Pen 16 Units at bedtime. 02/26/20   [provider]  ipratropium-albuterol (DUONEB) 0.5-2.5 (3) MG/3ML SOLN Take 3 mLs by nebulization every 6 (six) hours as needed. 04/25/20   Flora Lipps, MD  levothyroxine (SYNTHROID, LEVOTHROID) 137 MCG tablet Take 137 mcg by mouth daily before breakfast.     [provider]  meloxicam (MOBIC) 15 MG tablet Take 15 mg by mouth daily. Patient not taking: No sig reported    [provider]  metFORMIN (GLUCOPHAGE-XR) 500 MG 24 hr tablet Take 1,000 mg by mouth at bedtime. 01/13/19    [provider]  pantoprazole (PROTONIX) 40 MG tablet Take 1 tablet (40 mg total) by mouth daily. 03/04/19   Tyler Pita, MD  predniSONE (DELTASONE) 20 MG tablet Take 1 tablet (20 mg total) by mouth daily with breakfast. Patient not taking: No sig reported 04/25/20   Flora Lipps, MD  pregabalin (LYRICA) 75 MG capsule Take 75 mg by mouth 3 (three) times daily.    [provider]  propranolol (INDERAL) 40 MG tablet Take 40 mg by mouth 2 (two) times daily. Patient not taking: No sig reported    [provider]  VITAMIN D, ERGOCALCIFEROL, PO Take 1,000 Units by mouth 2 (two) times daily.    [provider]    Physical Exam:   Vitals:  06/07/20 1523 06/07/20 1600 06/07/20 1621 06/07/20 1750  BP: (!) 91/54 (!) 85/64 97/65 95/64   Pulse: (!) 106 92 (!) 50 100  Resp:  19  19  Temp:      TempSrc:      SpO2: 100% 99% 93% 100%  Weight:      Height:         Physical Exam: Blood pressure 95/64, pulse 100, temperature 98.3 F (36.8 C), temperature source Oral, resp. rate 19, height 5\' 3"  (1.6 m), weight 74.4 kg, SpO2 100 %. Gen: No acute distress. Head: Normocephalic, atraumatic. Eyes: Pupils equal, round and reactive to light. Extraocular movements intact.  Sclerae nonicteric.  Mouth: Moist mucous membranes Neck: Supple, no thyromegaly, no lymphadenopathy, no jugular venous distention. Chest: Lungs are clear to auscultation with good air movement. No rales, rhonchi or wheezes.  CV: Heart sounds are regular with an S1, S2. No murmurs, rubs or gallops.  Abdomen: Soft, nontender, nondistended with normal active bowel sounds. No palpable masses. Extremities: Extremities are without clubbing, or cyanosis.  Bilateral leg pitting edema 2+.   Skin: Warm and dry. No rashes.  Bruise on the mid forehead Neuro: Alert and oriented times 3; grossly nonfocal.  Psych: Insight is good and judgment is appropriate. Mood and affect normal.   Data Review:     Labs: Basic Metabolic Panel: Recent Labs  Lab 06/07/20 1419  NA 138  K 3.3*  CL 104  CO2 24  GLUCOSE 96  BUN 25*  CREATININE 1.13*  CALCIUM 7.2*  MG 1.1*   Liver Function Tests: Recent Labs  Lab 06/07/20 1419  AST 20  ALT 22  ALKPHOS 82  BILITOT 0.3  PROT 5.3*  ALBUMIN 2.5*   No results for input(s): LIPASE, AMYLASE in the last 168 hours. No results for input(s): AMMONIA in the last 168 hours. CBC: Recent Labs  Lab 06/07/20 1419  WBC 8.3  NEUTROABS 5.3  HGB 10.6*  HCT 31.4*  MCV 91.3  PLT 215   Cardiac Enzymes: No results for input(s): CKTOTAL, CKMB, CKMBINDEX, TROPONINI in the last 168 hours.  BNP (last 3 results) No results for input(s): PROBNP in the last 8760 hours. CBG: No results for input(s): GLUCAP in the last 168 hours.  Urinalysis    Component Value Date/Time   COLORURINE STRAW (A) 06/07/2020 1615   APPEARANCEUR CLEAR (A) 06/07/2020 1615   LABSPEC 1.003 (L) 06/07/2020 1615   PHURINE 6.0 06/07/2020 1615   GLUCOSEU NEGATIVE 06/07/2020 1615   HGBUR NEGATIVE 06/07/2020 1615   [place] 06/07/2020 1615   KETONESUR NEGATIVE 06/07/2020 1615   PROTEINUR NEGATIVE 06/07/2020 1615   NITRITE NEGATIVE 06/07/2020 [place] 06/07/2020 1615      Radiographic Studies: DG Chest 1 View  Result Date: 06/07/2020 CLINICAL DATA:  Syncope. EXAM: CHEST  1 VIEW COMPARISON:  CT 05/31/2000.  Chest x-ray 04/23/2020. FINDINGS: Monitoring device noted over the chest. Heart size normal. Low lung volumes. No focal infiltrate. No pleural effusion or pneumothorax. IMPRESSION: Low lung volumes.  No acute cardiopulmonary disease. Electronically Signed   By: Marcello Moores  Register   On: 06/07/2020 14:35   CT Head Wo Contrast  Result Date: 06/07/2020 CLINICAL DATA:  Syncope EXAM: CT HEAD WITHOUT CONTRAST TECHNIQUE: Contiguous axial images were obtained from the base of the skull through the vertex without intravenous contrast. COMPARISON:   None. FINDINGS: Brain: No acute intracranial abnormality. Specifically, no hemorrhage, hydrocephalus, mass lesion, acute infarction, or significant intracranial injury. Vascular: No hyperdense vessel  or unexpected calcification. Skull: No acute calvarial abnormality. Sinuses/Orbits: No acute findings Other: None IMPRESSION: No acute intracranial abnormality. Electronically Signed   By: Rolm Baptise M.D.   On: 06/07/2020 15:09    EKG: Independently reviewed.  Normal sinus rhythm, PVCs   Assessment/Plan:   Principal Problem:   Syncope Active Problems:   COPD (chronic obstructive pulmonary disease) (HCC)   PAD (peripheral artery disease) (HCC)   Chronic respiratory failure with hypoxia (HCC)   Hypotension   Hypomagnesemia   Body mass index is 29.05 kg/m.    Syncope and collapse, falls at home: Troponins were negative x2.  Admit to progressive care unit.  Monitor closely on telemetry.  Fall precautions.  Obtain 2D echo for further evaluation.  Consulted cardiologist, Dr. Clayborn Bigness, to assist with management.  Hypotension: Hold Lasix and carvedilol.  It is not clear whether hypotension as a cause of her syncope.  Hypokalemia and hypomagnesemia: replete potassium with oral potassium chloride and replete magnesium with IV magnesium sulfate.  Check phosphorus level.  Reported history of chronic systolic CHF/cardiomyopathy with EF of 15%: Patient was placed on LifeVest about 1 and half weeks ago when she was hospitalized in [place].  Consulted cardiologist.  Reported A. fib with RVR/paroxysmal A. fib: Telemetry shows normal sinus rhythm.  Continue to monitor.  COPD with chronic hypoxic respiratory failure: Continue 1.5 L/min oxygen via nasal cannula.  Continue bronchodilators.  PVD s/p femoral bypass graft: Continue Plavix  Type II diabetes mellitus: NovoLog as needed for hyperglycemia.     Other information:   DVT prophylaxis: Lovenox  Code Status: Full code. Family  Communication: Discussed with her husband at the bedside. Disposition Plan: Home in 1 to 2 days Consults called: Cardiologist Admission status: Observation  The medical decision making on this patient was of high complexity and the patient is at high risk for clinical deterioration, therefore this is a level 3 visit.    Leata Dominy [facility] Pager: Please check [email or web address]   How to contact the [facility] Attending or Consulting provider [place] or covering provider during after hours [place], for this patient?   1. Check the care team in [facility] and look for a) attending/consulting [facility] provider listed and b) the [facility] team listed 2. Log into [email or web address] and use [place]'s universal password to access. If you do not have the password, please contact the hospital operator. 3. Locate the [facility] provider you are looking for under [facility] and page to a number that you can be directly reached. 4. If you still have difficulty reaching the provider, please page the [facility] (Director on Call) for the Hospitalists listed on amion for assistance.  06/07/2020, 6:18 PM

## 2020-06-07 NOTE — ED Triage Notes (Signed)
Pt here via [facility] from home. Family called 911 this morning with reports of patient "going in and out of it." Questionable syncopal episode. Has fallen a few times this morning. Did hit head per report. Pt is on blood thinner, hx of a fib. States pt BP sitting was 93/60 and standing was 77/48. Pt A&O x3 on arrival to ED, denies CP or SOB

## 2020-06-08 ENCOUNTER — Observation Stay
Admit: 2020-06-08 | Discharge: 2020-06-08 | Disposition: A | Discharge status: Home / Self Care | Payer: Medicaid Other | Attending: Cardiovascular Disease | Admitting: Cardiovascular Disease

## 2020-06-08 ENCOUNTER — Encounter: Payer: Self-pay | Admitting: *Deleted

## 2020-06-08 DIAGNOSIS — I959 Hypotension, unspecified: Secondary | ICD-10-CM

## 2020-06-08 DIAGNOSIS — J449 Chronic obstructive pulmonary disease, unspecified: Secondary | ICD-10-CM | POA: Diagnosis not present

## 2020-06-08 DIAGNOSIS — R55 Syncope and collapse: Secondary | ICD-10-CM

## 2020-06-08 DIAGNOSIS — J9611 Chronic respiratory failure with hypoxia: Secondary | ICD-10-CM | POA: Diagnosis not present

## 2020-06-08 DIAGNOSIS — I428 Other cardiomyopathies: Secondary | ICD-10-CM

## 2020-06-08 LAB — CBC WITH DIFFERENTIAL/PLATELET
Abs Immature Granulocytes: 0.02 10*3/uL (ref 0.00–0.07)
Basophils Absolute: 0 10*3/uL (ref 0.0–0.1)
Basophils Relative: 0 %
Eosinophils Absolute: 0.2 10*3/uL (ref 0.0–0.5)
Eosinophils Relative: 3 %
HCT: 32.8 % — ABNORMAL LOW (ref 36.0–46.0)
Hemoglobin: 10.7 g/dL — ABNORMAL LOW (ref 12.0–15.0)
Immature Granulocytes: 0 %
Lymphocytes Relative: 30 %
Lymphs Abs: 2.2 10*3/uL (ref 0.7–4.0)
MCH: 30.7 pg (ref 26.0–34.0)
MCHC: 32.6 g/dL (ref 30.0–36.0)
MCV: 94.3 fL (ref 80.0–100.0)
Monocytes Absolute: 0.4 10*3/uL (ref 0.1–1.0)
Monocytes Relative: 5 %
Neutro Abs: 4.3 10*3/uL (ref 1.7–7.7)
Neutrophils Relative %: 62 %
Platelets: 223 10*3/uL (ref 150–400)
RBC: 3.48 MIL/uL — ABNORMAL LOW (ref 3.87–5.11)
RDW: 15 % (ref 11.5–15.5)
WBC: 7.1 10*3/uL (ref 4.0–10.5)
nRBC: 0 % (ref 0.0–0.2)

## 2020-06-08 LAB — PHOSPHORUS: Phosphorus: 2.5 mg/dL (ref 2.5–4.6)

## 2020-06-08 LAB — BASIC METABOLIC PANEL
Anion gap: 10 (ref 5–15)
BUN: 17 mg/dL (ref 6–20)
CO2: 23 mmol/L (ref 22–32)
Calcium: 7.4 mg/dL — ABNORMAL LOW (ref 8.9–10.3)
Chloride: 105 mmol/L (ref 98–111)
Creatinine, Ser: 0.97 mg/dL (ref 0.44–1.00)
GFR, Estimated: >60 mL/min (ref >60)
Glucose, Bld: 102 mg/dL — ABNORMAL HIGH (ref 70–99)
Potassium: 3.9 mmol/L (ref 3.5–5.1)
Sodium: 138 mmol/L (ref 135–145)

## 2020-06-08 LAB — GLUCOSE, CAPILLARY
Glucose-Capillary: 115 mg/dL — ABNORMAL HIGH (ref 70–99)
Glucose-Capillary: 151 mg/dL — ABNORMAL HIGH (ref 70–99)

## 2020-06-08 LAB — MAGNESIUM: Magnesium: 2.1 mg/dL (ref 1.7–2.4)

## 2020-06-08 LAB — CBG MONITORING, ED
Glucose-Capillary: 104 mg/dL — ABNORMAL HIGH (ref 70–99)
Glucose-Capillary: 117 mg/dL — ABNORMAL HIGH (ref 70–99)

## 2020-06-08 LAB — SARS CORONAVIRUS 2 (TAT 6-24 HRS): SARS Coronavirus 2: NEGATIVE

## 2020-06-08 LAB — HIV ANTIBODY (ROUTINE TESTING W REFLEX): HIV Screen 4th Generation wRfx: NONREACTIVE

## 2020-06-08 MED ORDER — NICOTINE 21 MG/24HR TD PT24
21.0000 mg | MEDICATED_PATCH | Freq: Every day | TRANSDERMAL | Status: DC
  Administered 2020-06-08: 21 mg via TRANSDERMAL
  Filled 2020-06-08: qty 1

## 2020-06-08 MED ORDER — FLUTICASONE PROPIONATE 50 MCG/ACT NA SUSP
1.0000 | Freq: Every day | NASAL | Status: DC | PRN
  Filled 2020-06-08: qty 16

## 2020-06-08 NOTE — Consult Note (Signed)
Shannon Russell is a 60 y.o. female  [id number]  Primary Cardiologist: Neoma Laming Reason for Consultation: Syncope  HPI: This a 60 year old white female with history of cardiomyopathy on Entresto and LifeVest and peripheral vascular disease presented to the hospital with a 2 episode where she passed out.  She denied any chest pain prior to passing.  F2   Review of Systems: No chest pain   Past Medical History:  Diagnosis Date  . Arthritis   . Asthma   . Cancer ([place])   . COPD (chronic obstructive pulmonary disease) ([place])   . Depression   . Diabetes mellitus without complication ([place])   . H/O blood clots   . PAD (peripheral artery disease) ([place])   . Thyroid disease     (Not in a hospital admission)    . atorvastatin  40 mg Oral Daily  . budesonide  0.5 mg Nebulization BID  . clopidogrel  75 mg Oral Daily  . enoxaparin (LOVENOX) injection  40 mg Subcutaneous Daily  . fluticasone  1 spray Each Nare Daily  . insulin aspart  0-9 Units Subcutaneous TID WC  . levothyroxine  137 mcg Oral Q0600  . pregabalin  75 mg Oral TID  . sodium chloride flush  3 mL Intravenous Q12H    Infusions:   Allergies  Allergen Reactions  . Advair Diskus [Fluticasone-Salmeterol] Hives  . Iodine Rash    Other reaction(s): Skin Rashes, Hives    Social History   Socioeconomic History  . Marital status: Legally Separated    Spouse name: Not on file  . Number of children: Not on file  . Years of education: Not on file  . Highest education level: Not on file  Occupational History  . Not on file  Tobacco Use  . Smoking status: Current Every Day Smoker    Packs/day: 0.25    Years: 45.00    Pack years: 11.25    Types: Cigarettes  . Smokeless tobacco: Never Used  . Tobacco comment: 6 ciggs a day.  Vaping Use  . Vaping Use: Never used  Substance and Sexual Activity  . Alcohol use: Not Currently  . Drug use: Not Currently  . Sexual activity: Not on file  Other Topics Concern  . Not  on file  Social History Narrative  . Not on file   Social Determinants of Health   Financial Resource Strain: Not on file  Food Insecurity: Not on file  Transportation Needs: Not on file  Physical Activity: Not on file  Stress: Not on file  Social Connections: Not on file  Intimate Partner Violence: Not on file    Family History  Problem Relation Age of Onset  . Heart failure Mother   . Depression Mother   . Peripheral Artery Disease Sister   . Cancer Sister   . Clotting disorder Sister   . Depression Sister   . Thyroid disease Sister     PHYSICAL EXAM: Vitals:   06/08/20 0700 06/08/20 0730  BP: 119/76 109/70  Pulse: 95 93  Resp: 14 20  Temp:    SpO2: 94% 97%     Intake/Output Summary (Last 24 hours) at 06/08/2020 0908 Last data filed at 06/08/2020 0233 Gross per 24 hour  Intake --  Output 2000 ml  Net -2000 ml    General:  Well appearing. No respiratory difficulty HEENT: normal Neck: supple. no JVD. Carotids 2+ bilat; no bruits. No lymphadenopathy or thryomegaly appreciated. Cor: PMI nondisplaced. Regular rate & rhythm.  No rubs, gallops or murmurs. Lungs: clear Abdomen: soft, nontender, nondistended. No hepatosplenomegaly. No bruits or masses. Good bowel sounds. Extremities: no cyanosis, clubbing, rash, edema Neuro: alert & oriented x 3, cranial nerves grossly intact. moves all 4 extremities w/o difficulty. Affect pleasant.  ECG: Normal sinus rhythm frequent PVCs nonspecific ST-T changes  Results for orders placed or performed during the hospital encounter of 06/07/20 (from the past 24 hour(s))  CBC with Differential     Status: Abnormal   Collection Time: 06/07/20  2:19 PM  Result Value Ref Range   WBC 8.3 4.0 - 10.5 K/uL   RBC 3.44 (L) 3.87 - 5.11 MIL/uL   Hemoglobin 10.6 (L) 12.0 - 15.0 g/dL   HCT 31.4 (L) 36.0 - 46.0 %   MCV 91.3 80.0 - 100.0 fL   MCH 30.8 26.0 - 34.0 pg   MCHC 33.8 30.0 - 36.0 g/dL   RDW 14.6 11.5 - 15.5 %   Platelets 215 150 -  400 K/uL   nRBC 0.0 0.0 - 0.2 %   Neutrophils Relative % 64 %   Neutro Abs 5.3 1.7 - 7.7 K/uL   Lymphocytes Relative 28 %   Lymphs Abs 2.3 0.7 - 4.0 K/uL   Monocytes Relative 5 %   Monocytes Absolute 0.5 0.1 - 1.0 K/uL   Eosinophils Relative 2 %   Eosinophils Absolute 0.2 0.0 - 0.5 K/uL   Basophils Relative 0 %   Basophils Absolute 0.0 0.0 - 0.1 K/uL   Immature Granulocytes 1 %   Abs Immature Granulocytes 0.04 0.00 - 0.07 K/uL  Comprehensive metabolic panel     Status: Abnormal   Collection Time: 06/07/20  2:19 PM  Result Value Ref Range   Sodium 138 135 - 145 mmol/L   Potassium 3.3 (L) 3.5 - 5.1 mmol/L   Chloride 104 98 - 111 mmol/L   CO2 24 22 - 32 mmol/L   Glucose, Bld 96 70 - 99 mg/dL   BUN 25 (H) 6 - 20 mg/dL   Creatinine, Ser 1.13 (H) 0.44 - 1.00 mg/dL   Calcium 7.2 (L) 8.9 - 10.3 mg/dL   Total Protein 5.3 (L) 6.5 - 8.1 g/dL   Albumin 2.5 (L) 3.5 - 5.0 g/dL   AST 20 15 - 41 U/L   ALT 22 0 - 44 U/L   Alkaline Phosphatase 82 38 - 126 U/L   Total Bilirubin 0.3 0.3 - 1.2 mg/dL   GFR, Estimated 56 (L) >60 mL/min   Anion gap 10 5 - 15  Troponin I (High Sensitivity)     Status: None   Collection Time: 06/07/20  2:19 PM  Result Value Ref Range   Troponin I (High Sensitivity) 14 <18 ng/L  Magnesium     Status: Abnormal   Collection Time: 06/07/20  2:19 PM  Result Value Ref Range   Magnesium 1.1 (L) 1.7 - 2.4 mg/dL  Urinalysis, Complete w Microscopic Urine, Clean Catch     Status: Abnormal   Collection Time: 06/07/20  4:15 PM  Result Value Ref Range   Color, Urine STRAW (A) YELLOW   APPearance CLEAR (A) CLEAR   Specific Gravity, Urine 1.003 (L) 1.005 - 1.030   pH 6.0 5.0 - 8.0   Glucose, UA NEGATIVE NEGATIVE mg/dL   Hgb urine dipstick NEGATIVE NEGATIVE   Bilirubin Urine NEGATIVE NEGATIVE   Ketones, ur NEGATIVE NEGATIVE mg/dL   Protein, ur NEGATIVE NEGATIVE mg/dL   Nitrite NEGATIVE NEGATIVE   Leukocytes,Ua NEGATIVE NEGATIVE   WBC,  UA 0-5 0 - 5 WBC/hpf   Bacteria, UA  RARE (A) NONE SEEN   Squamous Epithelial / LPF 6-10 0 - 5  Troponin I (High Sensitivity)     Status: None   Collection Time: 06/07/20  4:15 PM  Result Value Ref Range   Troponin I (High Sensitivity) 10 <18 ng/L  Phosphorus     Status: Abnormal   Collection Time: 06/07/20  4:15 PM  Result Value Ref Range   Phosphorus 2.2 (L) 2.5 - 4.6 mg/dL  CBG monitoring, ED     Status: Abnormal   Collection Time: 06/07/20 11:39 PM  Result Value Ref Range   Glucose-Capillary 133 (H) 70 - 99 mg/dL  CBC with Differential/Platelet     Status: Abnormal   Collection Time: 06/08/20  4:28 AM  Result Value Ref Range   WBC 7.1 4.0 - 10.5 K/uL   RBC 3.48 (L) 3.87 - 5.11 MIL/uL   Hemoglobin 10.7 (L) 12.0 - 15.0 g/dL   HCT 32.8 (L) 36.0 - 46.0 %   MCV 94.3 80.0 - 100.0 fL   MCH 30.7 26.0 - 34.0 pg   MCHC 32.6 30.0 - 36.0 g/dL   RDW 15.0 11.5 - 15.5 %   Platelets 223 150 - 400 K/uL   nRBC 0.0 0.0 - 0.2 %   Neutrophils Relative % 62 %   Neutro Abs 4.3 1.7 - 7.7 K/uL   Lymphocytes Relative 30 %   Lymphs Abs 2.2 0.7 - 4.0 K/uL   Monocytes Relative 5 %   Monocytes Absolute 0.4 0.1 - 1.0 K/uL   Eosinophils Relative 3 %   Eosinophils Absolute 0.2 0.0 - 0.5 K/uL   Basophils Relative 0 %   Basophils Absolute 0.0 0.0 - 0.1 K/uL   Immature Granulocytes 0 %   Abs Immature Granulocytes 0.02 0.00 - 0.07 K/uL  Basic metabolic panel     Status: Abnormal   Collection Time: 06/08/20  4:28 AM  Result Value Ref Range   Sodium 138 135 - 145 mmol/L   Potassium 3.9 3.5 - 5.1 mmol/L   Chloride 105 98 - 111 mmol/L   CO2 23 22 - 32 mmol/L   Glucose, Bld 102 (H) 70 - 99 mg/dL   BUN 17 6 - 20 mg/dL   Creatinine, Ser 0.97 0.44 - 1.00 mg/dL   Calcium 7.4 (L) 8.9 - 10.3 mg/dL   GFR, Estimated >60 >60 mL/min   Anion gap 10 5 - 15  Magnesium     Status: None   Collection Time: 06/08/20  4:28 AM  Result Value Ref Range   Magnesium 2.1 1.7 - 2.4 mg/dL  Phosphorus     Status: None   Collection Time: 06/08/20  4:28 AM   Result Value Ref Range   Phosphorus 2.5 2.5 - 4.6 mg/dL  CBG monitoring, ED     Status: Abnormal   Collection Time: 06/08/20  7:39 AM  Result Value Ref Range   Glucose-Capillary 104 (H) 70 - 99 mg/dL   DG Chest 1 View  Result Date: 06/07/2020 CLINICAL DATA:  Syncope. EXAM: CHEST  1 VIEW COMPARISON:  CT 05/31/2000.  Chest x-ray 04/23/2020. FINDINGS: Monitoring device noted over the chest. Heart size normal. Low lung volumes. No focal infiltrate. No pleural effusion or pneumothorax. IMPRESSION: Low lung volumes.  No acute cardiopulmonary disease. Electronically Signed   By: Marcello Moores  Register   On: 06/07/2020 14:35   CT Head Wo Contrast  Result Date: 06/07/2020 CLINICAL DATA:  Syncope EXAM: CT  HEAD WITHOUT CONTRAST TECHNIQUE: Contiguous axial images were obtained from the base of the skull through the vertex without intravenous contrast. COMPARISON:  None. FINDINGS: Brain: No acute intracranial abnormality. Specifically, no hemorrhage, hydrocephalus, mass lesion, acute infarction, or significant intracranial injury. Vascular: No hyperdense vessel or unexpected calcification. Skull: No acute calvarial abnormality. Sinuses/Orbits: No acute findings Other: None IMPRESSION: No acute intracranial abnormality. Electronically Signed   By: Rolm Baptise M.D.   On: 06/07/2020 15:09     ASSESSMENT AND PLAN: Syncopal episode with LifeVest and history of cardiomyopathy peripheral vascular disease but also have hypotension with blood pressure systolic running in the 97O.  Question is hypotension because of syncope or arrhythmia.  Will get echocardiogram to with evaluate ejection fraction.  Also will get interrogation of the LifeVest to see if ventricular arrhythmias  Shannon Russell A

## 2020-06-08 NOTE — Consult Note (Signed)
Electrophysiology Consultation:   Patient ID: Shannon Russell MRN: [id number]; DOB: 02/25/1960  Admit date: 06/07/2020 Date of Consult: 06/08/2020  PCP:  [facility]    HPI:   Shannon Russell is a 60 y.o. female with a hx of nonischemic cardiomyopathy who is being seen 06/08/2020 for the evaluation of syncope at the request of Dr. Manuella Ghazi.  The patient was admitted yesterday through the emergency department [facility] after multiple syncopal episodes that were witnessed by her husband.  She has a history of relatively recent diagnosis of nonischemic cardiomyopathy while she was in [place] for vacation.  She tells me that all this started back in February 2022 when she came down with an upper respiratory infection.  Her symptoms were severe and refractory to antibiotic therapy.  The symptoms continue to worsen.  Ultimately she had an admission in [place] for evaluation.  There she was diagnosed with a severely decreased ejection fraction.  It does not seem that she had a cardiac arrest.  Given her severely decreased left ventricular function, she was discharged on medical therapy with a LifeVest.  The LifeVest has never fired.  Today she tells me that she has had several syncopal episodes at home.  During the first few episodes she refused to come to the hospital for evaluation.  Each episode of syncope occurred shortly after standing from a seated position.  The episodes are not preceded by a prolonged prodrome.  It is unclear from her history how abrupt the syncope is.  Apparently, once she is supine she regains consciousness.  No signs of seizure activity.  Past Medical History:  Diagnosis Date  . Arthritis   . Asthma   . Cancer ([place])   . COPD (chronic obstructive pulmonary disease) ([place])   . Depression   . Diabetes mellitus without complication ([place])   . H/O blood clots   . PAD (peripheral artery disease) ([place])   . Thyroid disease     Past Surgical History:   Procedure Laterality Date  . Bypass right leg  2015  . CHOLECYSTECTOMY  1981  . Mesh implant right leg  2015  . THYROIDECTOMY  2013  . TUBAL LIGATION  1993     Home Medications:  Prior to Admission medications   Medication Sig Start Date End Date Taking? Authorizing Provider  aspirin EC 81 MG tablet Take 81 mg by mouth daily. Swallow whole.   Yes [provider]  atorvastatin (LIPITOR) 80 MG tablet Take 80 mg by mouth daily. 06/03/20  Yes [provider]  baclofen (LIORESAL) 10 MG tablet Take 10 mg by mouth every 12 (twelve) hours as needed. 06/05/20  Yes [provider]  carvedilol (COREG) 3.125 MG tablet Take 3.125 mg by mouth 2 (two) times daily. 06/03/20  Yes [provider]  furosemide (LASIX) 40 MG tablet Take 40 mg by mouth daily. 06/03/20  Yes [provider]  levothyroxine (SYNTHROID) 125 MCG tablet Take 125 mcg by mouth every morning. 06/03/20  Yes [provider]  montelukast (SINGULAIR) 10 MG tablet Take 1 tablet by mouth at bedtime. 06/03/20  Yes [provider]  sacubitril-valsartan (ENTRESTO) 24-26 MG Take 1 tablet by mouth 2 (two) times daily.   Yes [provider]  temazepam (RESTORIL) 15 MG capsule Take 15 mg by mouth at bedtime as needed. 06/05/20  Yes [provider]  albuterol (2.5 MG/3ML) 0.083% NEBU 3 mL, albuterol (5 MG/ML) 0.5% NEBU 0.5 mL Inhale 2.5 mg into the lungs every 4 (  four) hours as needed (wheezing, SOB).    [provider]  albuterol (PROVENTIL HFA;VENTOLIN HFA) 108 (90 Base) MCG/ACT inhaler Inhale 2 puffs into the lungs every 6 (six) hours as needed for wheezing.    [provider]  amoxicillin-clavulanate (AUGMENTIN) 875-125 MG tablet Take 1 tablet by mouth 2 (two) times daily. Patient not taking: No sig reported 04/25/20   Flora Lipps, MD  atorvastatin (LIPITOR) 40 MG tablet Take 40 mg by mouth daily. Patient not taking: Reported on 06/08/2020    [provider]  budesonide (PULMICORT) 0.5 MG/2ML nebulizer solution Take 2 mLs (0.5 mg total) by nebulization 2 (two) times daily. 02/05/19   Tyler Pita, MD  Cholecalciferol 1.25 MG (50000 UT) capsule Vitamin D3 1.25 MG (50000 UT) Oral Capsule QTY: 0 capsule Days: 0 Refills: 0  Written: 02/09/20 Patient Instructions: qaw 02/09/20   [provider]  clopidogrel (PLAVIX) 75 MG tablet Take 75 mg by mouth daily.    [provider]  famotidine (PEPCID) 20 MG tablet Take 1 tablet (20 mg total) by mouth at bedtime. Patient not taking: Reported on 06/08/2020 04/08/19   Martyn Ehrich, NP  fluticasone [facility]) 50 MCG/ACT nasal spray Place 1 spray into both nostrils daily. Patient not taking: Reported on 06/08/2020 01/13/19   [provider]  furosemide (LASIX) 20 MG tablet Take 1-2 tablets by mouth daily as needed. Patient not taking: Reported on 06/08/2020 01/10/19   [provider]  insulin glargine (LANTUS SOLOSTAR) 100 UNIT/ML Solostar Pen Inject 16 Units into the skin at bedtime. 02/26/20   [provider]  ipratropium-albuterol (DUONEB) 0.5-2.5 (3) MG/3ML SOLN Take 3 mLs by nebulization every 6 (six) hours as needed. 04/25/20   Flora Lipps, MD  levothyroxine (SYNTHROID, LEVOTHROID) 137 MCG tablet Take 137 mcg by mouth daily before breakfast.  Patient not taking: Reported on 06/08/2020    [provider]  meloxicam (MOBIC) 15 MG tablet Take 15 mg by mouth daily. Patient not taking: No sig reported    [provider]  metFORMIN (GLUCOPHAGE-XR) 500 MG 24 hr tablet Take 1,000 mg by mouth at bedtime. 01/13/19   [provider]  pantoprazole (PROTONIX) 40 MG tablet Take 1 tablet (40 mg total) by mouth daily. 03/04/19   Tyler Pita, MD  predniSONE (DELTASONE) 20 MG tablet Take 1 tablet (20 mg total) by mouth daily with breakfast. Patient not taking: No sig reported 04/25/20   Flora Lipps, MD  pregabalin (LYRICA) 75 MG capsule Take 75  mg by mouth 3 (three) times daily.    [provider]  propranolol (INDERAL) 40 MG tablet Take 40 mg by mouth 2 (two) times daily. Patient not taking: No sig reported    [provider]  VITAMIN D, ERGOCALCIFEROL, PO Take 1,000 Units by mouth 2 (two) times daily. Patient not taking: Reported on 06/08/2020    [provider]    Inpatient Medications: Scheduled Meds: . atorvastatin  40 mg Oral Daily  . budesonide  0.5 mg Nebulization BID  . clopidogrel  75 mg Oral Daily  . enoxaparin (LOVENOX) injection  40 mg Subcutaneous Daily  . insulin aspart  0-9 Units Subcutaneous TID WC  . levothyroxine  137 mcg Oral Q0600  . pregabalin  75 mg Oral TID  . sodium chloride flush  3 mL Intravenous Q12H   Continuous Infusions:  PRN Meds: acetaminophen **OR** acetaminophen, albuterol, fluticasone, ipratropium-albuterol, ondansetron **OR** ondansetron (ZOFRAN) IV, polyethylene glycol  Allergies:  Allergies  Allergen Reactions  . Advair Diskus [Fluticasone-Salmeterol] Hives  . Iodine Rash    Other reaction(s): Skin Rashes, Hives    Social History:   Social History   Socioeconomic History  . Marital status: Legally Separated    Spouse name: Not on file  . Number of children: Not on file  . Years of education: Not on file  . Highest education level: Not on file  Occupational History  . Not on file  Tobacco Use  . Smoking status: Current Every Day Smoker    Packs/day: 0.25    Years: 45.00    Pack years: 11.25    Types: Cigarettes  . Smokeless tobacco: Never Used  . Tobacco comment: 6 ciggs a day.  Vaping Use  . Vaping Use: Never used  Substance and Sexual Activity  . Alcohol use: Not Currently  . Drug use: Not Currently  . Sexual activity: Not on file  Other Topics Concern  . Not on file  Social History Narrative  . Not on file   Social Determinants of Health   Financial Resource Strain: Not on file  Food Insecurity: Not on file  Transportation  Needs: Not on file  Physical Activity: Not on file  Stress: Not on file  Social Connections: Not on file  Intimate Partner Violence: Not on file    Family History:    Family History  Problem Relation Age of Onset  . Heart failure Mother   . Depression Mother   . Peripheral Artery Disease Sister   . Cancer Sister   . Clotting disorder Sister   . Depression Sister   . Thyroid disease Sister      ROS:  Please see the history of present illness.   All other ROS reviewed and negative.     Physical Exam/Data:   Vitals:   06/08/20 1415 06/08/20 1430 06/08/20 1500 06/08/20 1530  BP: 93/63 95/68 (!) 90/57 104/75  Pulse: 87 88 92 96  Resp:      Temp:      TempSrc:      SpO2: 100% 97% 99% 98%  Weight:      Height:        Intake/Output Summary (Last 24 hours) at 06/08/2020 1615 Last data filed at 06/08/2020 0233 Gross per 24 hour  Intake --  Output 2000 ml  Net -2000 ml   Last 3 Weights 06/07/2020 06/02/2020 05/31/2020  Weight (lbs) 164 lb 166 lb 165 lb 3.2 oz  Weight (kg) 74.39 kg 75.297 kg 74.934 kg     Body mass index is 29.05 kg/m.  General:  Well nourished, well developed, in no acute distress HEENT: normal.  Poor dentition Lymph: no adenopathy Neck: no JVD Endocrine:  No thryomegaly Vascular: No carotid bruits; FA pulses 2+ bilaterally without bruits  Cardiac:  normal S1, S2; RRR; no murmur  Lungs:  clear to auscultation bilaterally, no wheezing, rhonchi or rales  Abd: soft, nontender, no hepatomegaly  Ext: no edema Musculoskeletal:  No deformities, BUE and BLE strength normal and equal Skin: warm and dry  Neuro:  CNs 2-12 intact, no focal abnormalities noted Psych:  Normal affect   EKG:  The EKG was personally reviewed and demonstrates: Sinus rhythm.  PVC     Telemetry:  Telemetry was personally reviewed and demonstrates: Sinus rhythm.  PVCs.  Relevant CV Studies:  June 08, 2020 echo pending  Laboratory Data:  High Sensitivity Troponin:   Recent Labs   Lab 06/07/20 1419 06/07/20 1615  TROPONINIHS 14 10     Chemistry Recent Labs  Lab 06/07/20 1419 06/08/20 0428  NA 138 138  K 3.3* 3.9  CL 104 105  CO2 24 23  GLUCOSE 96 102*  BUN 25* 17  CREATININE 1.13* 0.97  CALCIUM 7.2* 7.4*  GFRNONAA 56* >60  ANIONGAP 10 10    Recent Labs  Lab 06/07/20 1419  PROT 5.3*  ALBUMIN 2.5*  AST 20  ALT 22  ALKPHOS 82  BILITOT 0.3   Hematology Recent Labs  Lab 06/07/20 1419 06/08/20 0428  WBC 8.3 7.1  RBC 3.44* 3.48*  HGB 10.6* 10.7*  HCT 31.4* 32.8*  MCV 91.3 94.3  MCH 30.8 30.7  MCHC 33.8 32.6  RDW 14.6 15.0  PLT 215 223   BNPNo results for input(s): BNP, PROBNP in the last 168 hours.  DDimer No results for input(s): DDIMER in the last 168 hours.   Radiology/Studies:  DG Chest 1 View  Result Date: 06/07/2020 CLINICAL DATA:  Syncope. EXAM: CHEST  1 VIEW COMPARISON:  CT 05/31/2000.  Chest x-ray 04/23/2020. FINDINGS: Monitoring device noted over the chest. Heart size normal. Low lung volumes. No focal infiltrate. No pleural effusion or pneumothorax. IMPRESSION: Low lung volumes.  No acute cardiopulmonary disease. Electronically Signed   By: Marcello Moores  Register   On: 06/07/2020 14:35   CT Head Wo Contrast  Result Date: 06/07/2020 CLINICAL DATA:  Syncope EXAM: CT HEAD WITHOUT CONTRAST TECHNIQUE: Contiguous axial images were obtained from the base of the skull through the vertex without intravenous contrast. COMPARISON:  None. FINDINGS: Brain: No acute intracranial abnormality. Specifically, no hemorrhage, hydrocephalus, mass lesion, acute infarction, or significant intracranial injury. Vascular: No hyperdense vessel or unexpected calcification. Skull: No acute calvarial abnormality. Sinuses/Orbits: No acute findings Other: None IMPRESSION: No acute intracranial abnormality. Electronically Signed   By: Rolm Baptise M.D.   On: 06/07/2020 15:09     Assessment and Plan:   1. Nonischemic cardiomyopathy Patient with a relatively  recent diagnosis of nonischemic cardiomyopathy in the setting of a severe viral/bacterial infection starting in February 2022.  This was initially evaluated in [place].  I suspect that the new set of medications for her cardiomyopathy have led to some orthostatic hypotension and likely her syncopal episodes.  I would recommend that while she is inpatient we try to titrate her medication regimen to confirm that she tolerates the medications.  We will need to reassess her left ventricular function in 3 months after she is on stable medical therapy.  Would recommend gentle initiation of GDMT.  We will plan for follow-up in approximately 3 months with reassessment of left ventricular function to assess for the need of ICD.  In the meantime, recommend continuing LifeVest.  2.  Syncope I do not suspect an arrhythmic cause of syncope.  I think more likely explanation is hypotension related to her new medications for her cardiomyopathy.  Continue telemetry monitoring while inpatient.  LifeVest upon discharge.  Recommend no driving given syncope and new diagnosis cardiomyopathy.  I have discussed the above with Dr. Manuella Ghazi who is in agreement.    For questions or updates, please contact [place] Please consult [email or web address] for contact info under    Signed, Vickie Epley, MD  06/08/2020 4:15 PM

## 2020-06-08 NOTE — Plan of Care (Signed)

## 2020-06-08 NOTE — ED Notes (Signed)
Food tray provided. Pt on phone with family.

## 2020-06-08 NOTE — ED Notes (Signed)
Called dietary to get breakfast tray brought to ER since order was just placed.

## 2020-06-08 NOTE — Progress Notes (Signed)
1        [facility] [place] NAME: Shannon Russell    MR#:  [id number]  DATE OF BIRTH:  [birth year]  SUBJECTIVE:  CHIEF COMPLAINT:   Chief Complaint  Patient presents with  . Loss of Consciousness  Denies any further symptoms feels tired.  Wants to eat REVIEW OF SYSTEMS:  Review of Systems  Constitutional: Negative for diaphoresis, fever, malaise/fatigue and weight loss.  HENT: Negative for ear discharge, ear pain, hearing loss, nosebleeds, sore throat and tinnitus.   Eyes: Negative for blurred vision and pain.  Respiratory: Negative for cough, hemoptysis, shortness of breath and wheezing.   Cardiovascular: Negative for chest pain, palpitations, orthopnea and leg swelling.  Gastrointestinal: Negative for abdominal pain, blood in stool, constipation, diarrhea, heartburn, nausea and vomiting.  Genitourinary: Negative for dysuria, frequency and urgency.  Musculoskeletal: Negative for back pain and myalgias.  Skin: Negative for itching and rash.  Neurological: Positive for dizziness. Negative for tingling, tremors, focal weakness, seizures, weakness and headaches.  Psychiatric/Behavioral: Negative for depression. The patient is not nervous/anxious.    DRUG ALLERGIES:   Allergies  Allergen Reactions  . Advair Diskus [Fluticasone-Salmeterol] Hives  . Iodine Rash    Other reaction(s): Skin Rashes, Hives   VITALS:  Blood pressure 98/85, pulse 87, temperature 98.3 F (36.8 C), temperature source Oral, resp. rate 16, height 5\' 3"  (1.6 m), weight 74.4 kg, SpO2 97 %. PHYSICAL EXAMINATION:  Physical Exam  60 year old female lying in the bed comfortably without any acute distress Eyes pupils equal round and reactive to light and accommodation Lungs clear to auscultation bilaterally no wheezing rales rhonchi crepitation Cardiovascular S1-S2 normal no murmur rales or gallop Abdomen soft, benign Neuro alert and oriented, nonfocal Skin no rash or lesion LABORATORY  PANEL:  Female CBC Recent Labs  Lab 06/08/20 0428  WBC 7.1  HGB 10.7*  HCT 32.8*  PLT 223   ------------------------------------------------------------------------------------------------------------------ Chemistries  Recent Labs  Lab 06/07/20 1419 06/08/20 0428  NA 138 138  K 3.3* 3.9  CL 104 105  CO2 24 23  GLUCOSE 96 102*  BUN 25* 17  CREATININE 1.13* 0.97  CALCIUM 7.2* 7.4*  MG 1.1* 2.1  AST 20  --   ALT 22  --   ALKPHOS 82  --   BILITOT 0.3  --    RADIOLOGY:  No results found. ASSESSMENT AND PLAN:  60 y.o. female with medical history significant for chronic hypoxemic respiratory failure on 1.5 l/min oxygen, COPD, tobacco use disorder, PVD on Plavix, type II DM, chronic systolic CHF with reported EF of 15% on LifeVest admitted for recurrent syncope  Recurrent syncope Likely from orthostatic hypotension based on EP and cardiology eval/input.  No arrhythmia reported on telemetry. Will titrate her medication regimen to confirm she is able to tolerate No further syncope while here in the hospital Will need outpatient LV function reevaluation in 3 months  Nonischemic cardiomyopathy with EF of 15% on LifeVest Relatively new diagnosis in the setting of viral/bacterial infection in February 2022 at [place] Further cardiac eval as an outpatient  Hypotension: Hold Lasix and carvedilol  Hypokalemia and hypomagnesemia: repleted  Reported A. fib with RVR/paroxysmal A. fib: Telemetry shows normal sinus rhythm.  Continue to monitor.  COPD with chronic hypoxic respiratory failure: Continue 1.5 L/min oxygen via nasal cannula.  Continue bronchodilators.  PVD s/p femoral bypass graft: Continue Plavix  Type II diabetes mellitus: NovoLog as needed for hyperglycemia.  Body mass index is 29.05 kg/m.  Net IO Since Admission: -2,000 mL [06/08/20 1703]      Status is: Observation  The patient remains OBS appropriate and will d/c before 2 midnights.  Dispo: The  patient is from: Home              Anticipated d/c is to: Home              Patient currently is not medically stable to d/c.   Difficult to place patient No       DVT prophylaxis:       enoxaparin (LOVENOX) injection 40 mg Start: 06/08/20 1000     Family Communication: "discussed with patient")   All the records are reviewed and case discussed with Care Management/Social Worker. Management plans discussed with the patient, nursing and they are in agreement.  CODE STATUS: Full Code Level of care: Progressive Cardiac  TOTAL TIME TAKING CARE OF THIS PATIENT: 35 minutes.   More than 50% of the time was spent in counseling/coordination of care: YES  POSSIBLE D/C IN 1-2 DAYS, DEPENDING ON CLINICAL CONDITION.   Max Sane M.D on 06/08/2020 at 5:03 PM  [facility]   CC: Primary care physician; [facility]  Note: This dictation was prepared with Dragon dictation along with smaller phrase technology. Any transcriptional errors that result from this process are unintentional.

## 2020-06-08 NOTE — Progress Notes (Signed)
*  PRELIMINARY RESULTS* Echocardiogram 2D Echocardiogram has been performed.  Shannon Russell 06/08/2020, 11:39 AM

## 2020-06-08 NOTE — Progress Notes (Signed)
Brief Cardiology Consult Note  Imp Syncope  NICM CHF Hypotension  SOB COPD Hypoxia  DM PVD Afib?->NSR . Plan Tele 2L [place] Life vest Amiodarone for Afib/ pVCs B-blocker Entresto vs ACE/ARB Midodrine for hypotension  Gently hydrate Records from [place]  Echo to confirm CM She a patient of Humphrey Rolls . I rec consulting SK. Thanks, [employer]

## 2020-06-09 DIAGNOSIS — R55 Syncope and collapse: Secondary | ICD-10-CM | POA: Diagnosis not present

## 2020-06-09 DIAGNOSIS — J9611 Chronic respiratory failure with hypoxia: Secondary | ICD-10-CM | POA: Diagnosis not present

## 2020-06-09 DIAGNOSIS — I493 Ventricular premature depolarization: Secondary | ICD-10-CM

## 2020-06-09 DIAGNOSIS — J449 Chronic obstructive pulmonary disease, unspecified: Secondary | ICD-10-CM | POA: Diagnosis not present

## 2020-06-09 LAB — CBC
HCT: 31.5 % — ABNORMAL LOW (ref 36.0–46.0)
Hemoglobin: 10.4 g/dL — ABNORMAL LOW (ref 12.0–15.0)
MCH: 30.9 pg (ref 26.0–34.0)
MCHC: 33 g/dL (ref 30.0–36.0)
MCV: 93.5 fL (ref 80.0–100.0)
Platelets: 247 10*3/uL (ref 150–400)
RBC: 3.37 MIL/uL — ABNORMAL LOW (ref 3.87–5.11)
RDW: 14.8 % (ref 11.5–15.5)
WBC: 8.2 10*3/uL (ref 4.0–10.5)
nRBC: 0 % (ref 0.0–0.2)

## 2020-06-09 LAB — BASIC METABOLIC PANEL
Anion gap: 9 (ref 5–15)
BUN: 19 mg/dL (ref 6–20)
CO2: 27 mmol/L (ref 22–32)
Calcium: 8.4 mg/dL — ABNORMAL LOW (ref 8.9–10.3)
Chloride: 102 mmol/L (ref 98–111)
Creatinine, Ser: 0.94 mg/dL (ref 0.44–1.00)
GFR, Estimated: >60 mL/min (ref >60)
Glucose, Bld: 104 mg/dL — ABNORMAL HIGH (ref 70–99)
Potassium: 4.2 mmol/L (ref 3.5–5.1)
Sodium: 138 mmol/L (ref 135–145)

## 2020-06-09 LAB — ECHOCARDIOGRAM COMPLETE
AR max vel: 2.05 cm2
AV Area VTI: 1.61 cm2
AV Area mean vel: 1.82 cm2
AV Mean grad: 3 mmHg
AV Peak grad: 4.9 mmHg
Ao pk vel: 1.11 m/s
Area-P 1/2: 5.54 cm2
Height: 63 in
MV VTI: 2.38 cm2
S' Lateral: 3.3 cm
Weight: 2624 oz

## 2020-06-09 LAB — GLUCOSE, CAPILLARY: Glucose-Capillary: 107 mg/dL — ABNORMAL HIGH (ref 70–99)

## 2020-06-09 NOTE — Plan of Care (Signed)

## 2020-06-09 NOTE — Progress Notes (Signed)
Patient discharged per orders, education complete, patient expressed understanding. Preferred to take morning medication once home. Patient stable and taken down with family to medical lobby via wheelchair by volunteer.

## 2020-06-09 NOTE — Discharge Instructions (Signed)
Near-Syncope Near-syncope is when you suddenly get weak or dizzy, or you feel like you might pass out (faint). This may also be called presyncope. This is due to a lack of blood flow to the brain. During an episode of near-syncope, you may:  Feel dizzy, weak, or light-headed.  Feel sick to your stomach (nauseous).  See all white or all black.  See spots.  Have cold, clammy skin. This condition is caused by a sudden decrease in blood flow to the brain. This decrease can result from various causes, but most of those causes are not dangerous. However, near-syncope may be a sign of a serious medical problem, so it is important to seek medical care. Follow these instructions at home: Medicines  Take over-the-counter and prescription medicines only as told by your doctor.  If you are taking blood pressure or heart medicine, get up slowly and spend many minutes getting ready to sit and then stand. This can help with dizziness. General instructions  Be aware of any changes in your symptoms.  Talk with your doctor about your symptoms. You may need to have testing to find the cause of your near-syncope.  If you start to feel like you might pass out, lie down right away. Raise (elevate) your feet above the level of your heart. Breathe deeply and steadily. Wait until all of the symptoms are gone.  Have someone stay with you until you feel stable.  Do not drive, use machinery, or play sports until your doctor says it is okay.  Drink enough fluid to keep your pee (urine) pale yellow.  Keep all follow-up visits as told by your doctor. This is important. Get help right away if you:  Have a seizure.  Have pain in your: ? Chest. ? Belly (abdomen). ? Back.  Faint once or more than once.  Have a very bad headache.  Are bleeding from your mouth or butt.  Have black or tarry poop (stool).  Have a very fast or uneven heartbeat (palpitations).  Are mixed up (confused).  Have trouble  walking.  Are very weak.  Have trouble seeing. These symptoms may be an emergency. Do not wait to see if the symptoms will go away. Get medical help right away. Call your local emergency services (911 in the [place] Do not drive yourself to the hospital. Summary  Near-syncope is when you suddenly get weak or dizzy, or you feel like you might pass out (faint).  This condition is caused by a lack of blood flow to the brain.  Near-syncope may be a sign of a serious medical problem, so it is important to seek medical care. This information is not intended to replace advice given to you by your health care provider. Make sure you discuss any questions you have with your health care provider. Document Revised: 04/18/2018 Document Reviewed: 11/13/2017 Elsevier Patient Education  2021 [employer].

## 2020-06-09 NOTE — Progress Notes (Signed)
SUBJECTIVE: Still some short of breath   Vitals:   06/08/20 1726 06/08/20 2050 06/09/20 0414 06/09/20 0500  BP: (!) 90/46 93/65 (!) 97/55   Pulse: 93 91 94   Resp: 20 18 18    Temp: 98.3 F (36.8 C) 98.9 F (37.2 C) 98.2 F (36.8 C)   TempSrc:  Oral    SpO2: 99% 97% 94%   Weight: 73.8 kg   73.7 kg  Height:        Intake/Output Summary (Last 24 hours) at 06/09/2020 0831 Last data filed at 06/09/2020 0334 Gross per 24 hour  Intake --  Output 750 ml  Net -750 ml    LABS: Basic Metabolic Panel: Recent Labs    06/07/20 1419 06/07/20 1615 06/08/20 0428 06/09/20 0548  NA 138  --  138 138  K 3.3*  --  3.9 4.2  CL 104  --  105 102  CO2 24  --  23 27  GLUCOSE 96  --  102* 104*  BUN 25*  --  17 19  CREATININE 1.13*  --  0.97 0.94  CALCIUM 7.2*  --  7.4* 8.4*  MG 1.1*  --  2.1  --   PHOS  --  2.2* 2.5  --    Liver Function Tests: Recent Labs    06/07/20 1419  AST 20  ALT 22  ALKPHOS 82  BILITOT 0.3  PROT 5.3*  ALBUMIN 2.5*   No results for input(s): LIPASE, AMYLASE in the last 72 hours. CBC: Recent Labs    06/07/20 1419 06/08/20 0428 06/09/20 0548  WBC 8.3 7.1 8.2  NEUTROABS 5.3 4.3  --   HGB 10.6* 10.7* 10.4*  HCT 31.4* 32.8* 31.5*  MCV 91.3 94.3 93.5  PLT 215 223 247   Cardiac Enzymes: No results for input(s): CKTOTAL, CKMB, CKMBINDEX, TROPONINI in the last 72 hours. BNP: Invalid input(s): POCBNP D-Dimer: No results for input(s): DDIMER in the last 72 hours. Hemoglobin A1C: No results for input(s): HGBA1C in the last 72 hours. Fasting Lipid Panel: No results for input(s): CHOL, HDL, LDLCALC, TRIG, CHOLHDL, LDLDIRECT in the last 72 hours. Thyroid Function Tests: No results for input(s): TSH, T4TOTAL, T3FREE, THYROIDAB in the last 72 hours.  Invalid input(s): FREET3 Anemia Panel: No results for input(s): VITAMINB12, FOLATE, FERRITIN, TIBC, IRON, RETICCTPCT in the last 72 hours.   PHYSICAL EXAM General: Well developed, well nourished, in no acute  distress HEENT:  Normocephalic and atramatic Neck:  No JVD.  Lungs: Clear bilaterally to auscultation and percussion. Heart: HRRR . Normal S1 and S2 without gallops or murmurs.  Abdomen: Bowel sounds are positive, abdomen soft and non-tender  Msk:  Back normal, normal gait. Normal strength and tone for age. Extremities: No clubbing, cyanosis or edema.   Neuro: Alert and oriented X 3. Psych:  Good affect, responds appropriately  TELEMETRY: Sinus rhythm  ASSESSMENT AND PLAN: Syncope with a history of congestive heart failure and peripheral vascular disease along with hypertension.  Ejection fraction was 61% which is normal with grade 1 diastolic dysfunction.  Interrogation of LifeVest is still pending.  I do not think arrhythmia because this syncope due to left ventricular ejection fraction being normal most likely due to hypotension.  We will cut back on medication.  Principal Problem:   Syncope Active Problems:   COPD (chronic obstructive pulmonary disease) (HCC)   PAD (peripheral artery disease) (HCC)   Chronic respiratory failure with hypoxia (HCC)   Hypotension   Hypomagnesemia   Frequent PVCs  Dionisio David, MD, [facility] 06/09/2020 8:31 AM

## 2020-06-09 NOTE — Telephone Encounter (Signed)
Record request has been re faxed.

## 2020-06-11 NOTE — Discharge Summary (Signed)
[place] [facility] [place] NAME: Shannon Russell    MR#:  [id number]  DATE OF BIRTH:  05/21/1960  DATE OF ADMISSION:  06/07/2020   ADMITTING PHYSICIAN: Jennye Boroughs, MD  DATE OF DISCHARGE: 06/09/2020 11:05 AM  PRIMARY CARE PHYSICIAN: [facility]   ADMISSION DIAGNOSIS:  Syncope and collapse [R55] Syncope [R55] Frequent PVCs [I49.3] DISCHARGE DIAGNOSIS:  Principal Problem:   Syncope Active Problems:   COPD (chronic obstructive pulmonary disease) (HCC)   PAD (peripheral artery disease) (HCC)   Chronic respiratory failure with hypoxia (HCC)   Hypotension   Hypomagnesemia   Frequent PVCs  SECONDARY DIAGNOSIS:   Past Medical History:  Diagnosis Date  . Arthritis   . Asthma   . Cancer ([place])   . COPD (chronic obstructive pulmonary disease) ([place])   . Depression   . Diabetes mellitus without complication ([place])   . H/O blood clots   . PAD (peripheral artery disease) ([place])   . Thyroid disease    HOSPITAL COURSE:  60 y.o.femalewith medical history significant for chronic hypoxemic respiratory failure on 1.5 l/min oxygen, COPD, tobacco use disorder, PVD on Plavix, type II DM, chronic systolic CHF with reported EF of 15% on LifeVest admitted for recurrent syncope  Recurrent syncope Likely from orthostatic hypotension based on EP and cardiology eval/input.  No arrhythmia on telemetry. No further syncope while here in the hospital Will need outpatient LV function reevaluation in 3 months with cardiology  Nonischemic cardiomyopathy with EF of 15% on LifeVest Relatively new diagnosis in the setting of viral/bacterial infection in February 2022 at [place] Further cardiac eval as an outpatient  Hypotension: holding most of her BP meds due to same  Hypokalemia and hypomagnesemia:repleted  Paroxysmal A. fib: in normal sinus rhythm.   COPD with chronic hypoxic respiratory failure: on room air at D/C  PVD s/p femoral bypass graft:  Continue Plavix  Type II diabetes mellitus: Sliding scale while in the hospital. Resume home regimen at D/C   DISCHARGE CONDITIONS:  stable CONSULTS OBTAINED:   DRUG ALLERGIES:   Allergies  Allergen Reactions  . Advair Diskus [Fluticasone-Salmeterol] Hives  . Iodine Rash    Other reaction(s): Skin Rashes, Hives   DISCHARGE MEDICATIONS:   Allergies as of 06/09/2020      Reactions   Advair Diskus [fluticasone-salmeterol] Hives   Iodine Rash   Other reaction(s): Skin Rashes, Hives      Medication List    STOP taking these medications   amoxicillin-clavulanate 875-125 MG tablet Commonly known as: AUGMENTIN   carvedilol 3.125 MG tablet Commonly known as: COREG   Entresto 24-26 MG Generic drug: sacubitril-valsartan   famotidine 20 MG tablet Commonly known as: PEPCID   fluticasone 50 MCG/ACT nasal spray Commonly known as: FLONASE   meloxicam 15 MG tablet Commonly known as: MOBIC   predniSONE 20 MG tablet Commonly known as: DELTASONE   propranolol 40 MG tablet Commonly known as: INDERAL   VITAMIN D (ERGOCALCIFEROL) PO     TAKE these medications   albuterol 108 (90 Base) MCG/ACT inhaler Commonly known as: VENTOLIN HFA Inhale 2 puffs into the lungs every 6 (six) hours as needed for wheezing.   albuterol (2.5 MG/3ML) 0.083% NEBU 3 mL, albuterol (5 MG/ML) 0.5% NEBU 0.5 mL Inhale 2.5 mg into the lungs every 4 (four) hours as needed (wheezing, SOB).   aspirin EC 81 MG tablet Take 81 mg by mouth daily. Swallow whole.   atorvastatin 80 MG tablet Commonly known as: LIPITOR Take  80 mg by mouth daily. What changed: Another medication with the same name was removed. Continue taking this medication, and follow the directions you see here.   baclofen 10 MG tablet Commonly known as: LIORESAL Take 10 mg by mouth every 12 (twelve) hours as needed.   budesonide 0.5 MG/2ML nebulizer solution Commonly known as: PULMICORT Take 2 mLs (0.5 mg total) by nebulization 2  (two) times daily.   Cholecalciferol 1.25 MG (50000 UT) capsule Vitamin D3 1.25 MG (50000 UT) Oral Capsule QTY: 0 capsule Days: 0 Refills: 0  Written: 02/09/20 Patient Instructions: qaw   clopidogrel 75 MG tablet Commonly known as: PLAVIX Take 75 mg by mouth daily.   furosemide 40 MG tablet Commonly known as: LASIX Take 40 mg by mouth daily. What changed: Another medication with the same name was removed. Continue taking this medication, and follow the directions you see here.   ipratropium-albuterol 0.5-2.5 (3) MG/3ML Soln Commonly known as: DUONEB Take 3 mLs by nebulization every 6 (six) hours as needed.   Lantus SoloStar 100 UNIT/ML Solostar Pen Generic drug: insulin glargine Inject 16 Units into the skin at bedtime.   levothyroxine 125 MCG tablet Commonly known as: SYNTHROID Take 125 mcg by mouth every morning. What changed: Another medication with the same name was removed. Continue taking this medication, and follow the directions you see here.   metFORMIN 500 MG 24 hr tablet Commonly known as: GLUCOPHAGE-XR Take 1,000 mg by mouth at bedtime.   montelukast 10 MG tablet Commonly known as: SINGULAIR Take 1 tablet by mouth at bedtime.   pantoprazole 40 MG tablet Commonly known as: Protonix Take 1 tablet (40 mg total) by mouth daily.   pregabalin 75 MG capsule Commonly known as: LYRICA Take 75 mg by mouth 3 (three) times daily.   temazepam 15 MG capsule Commonly known as: RESTORIL Take 15 mg by mouth at bedtime as needed.      DISCHARGE INSTRUCTIONS:   DIET:  Cardiac diet DISCHARGE CONDITION:  Stable ACTIVITY:  Activity as tolerated OXYGEN:  Home Oxygen: No.  Oxygen Delivery: room air DISCHARGE LOCATION:  home   If you experience worsening of your admission symptoms, develop shortness of breath, life threatening emergency, suicidal or homicidal thoughts you must seek medical attention immediately by calling 911 or calling your MD immediately  if  symptoms less severe.  You Must read complete instructions/literature along with all the possible adverse reactions/side effects for all the Medicines you take and that have been prescribed to you. Take any new Medicines after you have completely understood and accpet all the possible adverse reactions/side effects.   Please note  You were cared for by a hospitalist during your hospital stay. If you have any questions about your discharge medications or the care you received while you were in the hospital after you are discharged, you can call the unit and asked to speak with the hospitalist on call if the hospitalist that took care of you is not available. Once you are discharged, your primary care physician will handle any further medical issues. Please note that NO REFILLS for any discharge medications will be authorized once you are discharged, as it is imperative that you return to your primary care physician (or establish a relationship with a primary care physician if you do not have one) for your aftercare needs so that they can reassess your need for medications and monitor your lab values.    On the day of Discharge:  VITAL SIGNS:  Blood  pressure (!) 97/55, pulse 94, temperature 98.2 F (36.8 C), resp. rate 18, height 5\' 3"  (1.6 m), weight 73.7 kg, SpO2 94 %. PHYSICAL EXAMINATION:  GENERAL:  60 y.o.-year-old patient lying in the bed with no acute distress.  EYES: Pupils equal, round, reactive to light and accommodation. No scleral icterus. Extraocular muscles intact.  HEENT: Head atraumatic, normocephalic. Oropharynx and nasopharynx clear.  NECK:  Supple, no jugular venous distention. No thyroid enlargement, no tenderness.  LUNGS: Normal breath sounds bilaterally, no wheezing, rales,rhonchi or crepitation. No use of accessory muscles of respiration.  CARDIOVASCULAR: S1, S2 normal. No murmurs, rubs, or gallops.  ABDOMEN: Soft, non-tender, non-distended. Bowel sounds present. No  organomegaly or mass.  EXTREMITIES: No pedal edema, cyanosis, or clubbing.  NEUROLOGIC: Cranial nerves II through XII are intact. Muscle strength 5/5 in all extremities. Sensation intact. Gait not checked.  PSYCHIATRIC: The patient is alert and oriented x 3.  SKIN: No obvious rash, lesion, or ulcer.  DATA REVIEW:   CBC Recent Labs  Lab 06/09/20 0548  WBC 8.2  HGB 10.4*  HCT 31.5*  PLT 247    Chemistries  Recent Labs  Lab 06/07/20 1419 06/08/20 0428 06/09/20 0548  NA 138 138 138  K 3.3* 3.9 4.2  CL 104 105 102  CO2 24 23 27   GLUCOSE 96 102* 104*  BUN 25* 17 19  CREATININE 1.13* 0.97 0.94  CALCIUM 7.2* 7.4* 8.4*  MG 1.1* 2.1  --   AST 20  --   --   ALT 22  --   --   ALKPHOS 82  --   --   BILITOT 0.3  --   --      Outpatient follow-up  Follow-up Information    [facility]. Go on 06/14/2020.   Why: @ 2:00pm Contact information: [place] [address] [phone]        Vickie Epley, MD. Go on 06/20/2020.   Specialties: Cardiology, Radiology Why: @ 8:00am Contact information: [address] [place] [address] [phone]                  Management plans discussed with the patient, family and they are in agreement.  CODE STATUS: Prior   TOTAL TIME TAKING CARE OF THIS PATIENT: 45 minutes.    Max Sane M.D on 06/11/2020 at 10:57 AM  [facility]   CC: Primary care physician; [facility]   Note: This dictation was prepared with Dragon dictation along with smaller phrase technology. Any transcriptional errors that result from this process are unintentional.

## 2020-06-20 ENCOUNTER — Ambulatory Visit: Payer: Medicaid Other | Admitting: Physician Assistant

## 2020-06-22 NOTE — Telephone Encounter (Signed)
Lm for [facility] medical records department.

## 2020-06-28 NOTE — Telephone Encounter (Signed)
Records have been received and faxed to [facility].

## 2020-09-21 ENCOUNTER — Ambulatory Visit: Payer: Medicaid Other | Admitting: Cardiology

## 2021-07-06 ENCOUNTER — Telehealth: Payer: Self-pay | Admitting: Acute Care

## 2021-07-06 NOTE — Telephone Encounter (Signed)
Attempted to reach pt to schedule annual LDCT. Unable to leave message, mailbox full

## 2021-07-25 ENCOUNTER — Other Ambulatory Visit: Payer: Self-pay | Admitting: Nurse Practitioner

## 2021-07-25 DIAGNOSIS — Z1231 Encounter for screening mammogram for malignant neoplasm of breast: Secondary | ICD-10-CM

## 2021-07-27 ENCOUNTER — Inpatient Hospital Stay (HOSPITAL_COMMUNITY)
Admission: EM | Admit: 2021-07-27 | Discharge: 2021-07-30 | DRG: 191 | Disposition: A | Discharge status: Home / Self Care | Payer: Medicaid Other | Attending: Internal Medicine | Admitting: Internal Medicine

## 2021-07-27 ENCOUNTER — Encounter (HOSPITAL_COMMUNITY): Payer: Self-pay | Admitting: Emergency Medicine

## 2021-07-27 ENCOUNTER — Other Ambulatory Visit: Payer: Self-pay

## 2021-07-27 ENCOUNTER — Emergency Department (HOSPITAL_COMMUNITY): Payer: Medicaid Other

## 2021-07-27 DIAGNOSIS — E039 Hypothyroidism, unspecified: Secondary | ICD-10-CM | POA: Diagnosis not present

## 2021-07-27 DIAGNOSIS — N182 Chronic kidney disease, stage 2 (mild): Secondary | ICD-10-CM

## 2021-07-27 DIAGNOSIS — E1165 Type 2 diabetes mellitus with hyperglycemia: Secondary | ICD-10-CM | POA: Diagnosis present

## 2021-07-27 DIAGNOSIS — Z79890 Hormone replacement therapy (postmenopausal): Secondary | ICD-10-CM

## 2021-07-27 DIAGNOSIS — E785 Hyperlipidemia, unspecified: Secondary | ICD-10-CM | POA: Diagnosis present

## 2021-07-27 DIAGNOSIS — Z79899 Other long term (current) drug therapy: Secondary | ICD-10-CM | POA: Diagnosis not present

## 2021-07-27 DIAGNOSIS — F17210 Nicotine dependence, cigarettes, uncomplicated: Secondary | ICD-10-CM | POA: Diagnosis present

## 2021-07-27 DIAGNOSIS — R7989 Other specified abnormal findings of blood chemistry: Secondary | ICD-10-CM | POA: Diagnosis not present

## 2021-07-27 DIAGNOSIS — J441 Chronic obstructive pulmonary disease with (acute) exacerbation: Secondary | ICD-10-CM | POA: Diagnosis present

## 2021-07-27 DIAGNOSIS — Z7984 Long term (current) use of oral hypoglycemic drugs: Secondary | ICD-10-CM

## 2021-07-27 DIAGNOSIS — E66811 Obesity, class 1: Secondary | ICD-10-CM

## 2021-07-27 DIAGNOSIS — Z9981 Dependence on supplemental oxygen: Secondary | ICD-10-CM

## 2021-07-27 DIAGNOSIS — J479 Bronchiectasis, uncomplicated: Secondary | ICD-10-CM | POA: Diagnosis present

## 2021-07-27 DIAGNOSIS — E1151 Type 2 diabetes mellitus with diabetic peripheral angiopathy without gangrene: Secondary | ICD-10-CM | POA: Diagnosis present

## 2021-07-27 DIAGNOSIS — K219 Gastro-esophageal reflux disease without esophagitis: Secondary | ICD-10-CM | POA: Diagnosis present

## 2021-07-27 DIAGNOSIS — Z888 Allergy status to other drugs, medicaments and biological substances status: Secondary | ICD-10-CM

## 2021-07-27 DIAGNOSIS — Z6831 Body mass index (BMI) 31.0-31.9, adult: Secondary | ICD-10-CM

## 2021-07-27 DIAGNOSIS — Z8249 Family history of ischemic heart disease and other diseases of the circulatory system: Secondary | ICD-10-CM | POA: Diagnosis not present

## 2021-07-27 DIAGNOSIS — E669 Obesity, unspecified: Secondary | ICD-10-CM

## 2021-07-27 DIAGNOSIS — E1122 Type 2 diabetes mellitus with diabetic chronic kidney disease: Secondary | ICD-10-CM | POA: Diagnosis present

## 2021-07-27 DIAGNOSIS — Z7982 Long term (current) use of aspirin: Secondary | ICD-10-CM

## 2021-07-27 DIAGNOSIS — Z7951 Long term (current) use of inhaled steroids: Secondary | ICD-10-CM

## 2021-07-27 DIAGNOSIS — E872 Acidosis, unspecified: Secondary | ICD-10-CM | POA: Diagnosis present

## 2021-07-27 DIAGNOSIS — F17208 Nicotine dependence, unspecified, with other nicotine-induced disorders: Secondary | ICD-10-CM

## 2021-07-27 DIAGNOSIS — E782 Mixed hyperlipidemia: Secondary | ICD-10-CM

## 2021-07-27 DIAGNOSIS — T380X5A Adverse effect of glucocorticoids and synthetic analogues, initial encounter: Secondary | ICD-10-CM | POA: Diagnosis present

## 2021-07-27 DIAGNOSIS — Z20822 Contact with and (suspected) exposure to covid-19: Secondary | ICD-10-CM | POA: Diagnosis present

## 2021-07-27 DIAGNOSIS — Z794 Long term (current) use of insulin: Secondary | ICD-10-CM | POA: Diagnosis not present

## 2021-07-27 DIAGNOSIS — E860 Dehydration: Secondary | ICD-10-CM | POA: Diagnosis present

## 2021-07-27 DIAGNOSIS — F17200 Nicotine dependence, unspecified, uncomplicated: Secondary | ICD-10-CM | POA: Diagnosis present

## 2021-07-27 DIAGNOSIS — R051 Acute cough: Secondary | ICD-10-CM

## 2021-07-27 DIAGNOSIS — Z7902 Long term (current) use of antithrombotics/antiplatelets: Secondary | ICD-10-CM

## 2021-07-27 DIAGNOSIS — N179 Acute kidney failure, unspecified: Secondary | ICD-10-CM | POA: Diagnosis present

## 2021-07-27 HISTORY — DX: Obesity, class 1: E66.811

## 2021-07-27 LAB — CBG MONITORING, ED
Glucose-Capillary: 427 mg/dL — ABNORMAL HIGH (ref 70–99)
Glucose-Capillary: 425 mg/dL — ABNORMAL HIGH (ref 70–99)
Glucose-Capillary: 415 mg/dL — ABNORMAL HIGH (ref 70–99)

## 2021-07-27 LAB — URINALYSIS, ROUTINE W REFLEX MICROSCOPIC
Bacteria, UA: NONE SEEN
Bilirubin Urine: NEGATIVE
Glucose, UA: >=500 mg/dL — AB
Ketones, ur: NEGATIVE mg/dL
Leukocytes,Ua: NEGATIVE
Nitrite: NEGATIVE
Protein, ur: NEGATIVE mg/dL
Specific Gravity, Urine: 1.006 (ref 1.005–1.030)
pH: 5 (ref 5.0–8.0)

## 2021-07-27 LAB — CBC WITH DIFFERENTIAL/PLATELET
Abs Immature Granulocytes: 0.13 10*3/uL — ABNORMAL HIGH (ref 0.00–0.07)
Basophils Absolute: 0.1 10*3/uL (ref 0.0–0.1)
Basophils Relative: 1 %
Eosinophils Absolute: 1.3 10*3/uL — ABNORMAL HIGH (ref 0.0–0.5)
Eosinophils Relative: 10 %
HCT: 38.7 % (ref 36.0–46.0)
Hemoglobin: 13 g/dL (ref 12.0–15.0)
Immature Granulocytes: 1 %
Lymphocytes Relative: 20 %
Lymphs Abs: 2.6 10*3/uL (ref 0.7–4.0)
MCH: 31.1 pg (ref 26.0–34.0)
MCHC: 33.6 g/dL (ref 30.0–36.0)
MCV: 92.6 fL (ref 80.0–100.0)
Monocytes Absolute: 0.6 10*3/uL (ref 0.1–1.0)
Monocytes Relative: 5 %
Neutro Abs: 8.3 10*3/uL — ABNORMAL HIGH (ref 1.7–7.7)
Neutrophils Relative %: 63 %
Platelets: 284 10*3/uL (ref 150–400)
RBC: 4.18 MIL/uL (ref 3.87–5.11)
RDW: 14.5 % (ref 11.5–15.5)
WBC: 13 10*3/uL — ABNORMAL HIGH (ref 4.0–10.5)
nRBC: 0 % (ref 0.0–0.2)

## 2021-07-27 LAB — BASIC METABOLIC PANEL
Anion gap: 11 (ref 5–15)
Anion gap: 9 (ref 5–15)
BUN: 29 mg/dL — ABNORMAL HIGH (ref 6–20)
BUN: 33 mg/dL — ABNORMAL HIGH (ref 6–20)
CO2: 23 mmol/L (ref 22–32)
CO2: 22 mmol/L (ref 22–32)
Calcium: 9.2 mg/dL (ref 8.9–10.3)
Calcium: 8.7 mg/dL — ABNORMAL LOW (ref 8.9–10.3)
Chloride: 102 mmol/L (ref 98–111)
Chloride: 105 mmol/L (ref 98–111)
Creatinine, Ser: 1.87 mg/dL — ABNORMAL HIGH (ref 0.44–1.00)
Creatinine, Ser: 1.65 mg/dL — ABNORMAL HIGH (ref 0.44–1.00)
GFR, Estimated: 30 mL/min — ABNORMAL LOW (ref >60)
GFR, Estimated: 35 mL/min — ABNORMAL LOW (ref >60)
Glucose, Bld: 279 mg/dL — ABNORMAL HIGH (ref 70–99)
Glucose, Bld: 432 mg/dL — ABNORMAL HIGH (ref 70–99)
Potassium: 4.1 mmol/L (ref 3.5–5.1)
Potassium: 4.8 mmol/L (ref 3.5–5.1)
Sodium: 136 mmol/L (ref 135–145)
Sodium: 136 mmol/L (ref 135–145)

## 2021-07-27 LAB — MAGNESIUM: Magnesium: 1.8 mg/dL (ref 1.7–2.4)

## 2021-07-27 LAB — TROPONIN I (HIGH SENSITIVITY): Troponin I (High Sensitivity): 5 ng/L (ref <18)

## 2021-07-27 LAB — PHOSPHORUS: Phosphorus: 5 mg/dL — ABNORMAL HIGH (ref 2.5–4.6)

## 2021-07-27 LAB — SARS CORONAVIRUS 2 BY RT PCR: SARS Coronavirus 2 by RT PCR: NEGATIVE

## 2021-07-27 LAB — GLUCOSE, CAPILLARY: Glucose-Capillary: 462 mg/dL — ABNORMAL HIGH (ref 70–99)

## 2021-07-27 LAB — HIV ANTIBODY (ROUTINE TESTING W REFLEX): HIV Screen 4th Generation wRfx: NONREACTIVE

## 2021-07-27 LAB — HEMOGLOBIN A1C
Hgb A1c MFr Bld: 9.5 % — ABNORMAL HIGH (ref 4.8–5.6)
Mean Plasma Glucose: 225.95 mg/dL

## 2021-07-27 LAB — LACTIC ACID, PLASMA
Lactic Acid, Venous: 2.8 mmol/L (ref 0.5–1.9)
Lactic Acid, Venous: 4.5 mmol/L (ref 0.5–1.9)

## 2021-07-27 LAB — BRAIN NATRIURETIC PEPTIDE: B Natriuretic Peptide: 37 pg/mL (ref 0.0–100.0)

## 2021-07-27 MED ORDER — LEVOTHYROXINE SODIUM 100 MCG PO TABS
100.0000 ug | ORAL_TABLET | Freq: Every morning | ORAL | Status: DC
  Administered 2021-07-28 – 2021-07-30 (×3): 100 ug via ORAL
  Filled 2021-07-27 (×3): qty 1

## 2021-07-27 MED ORDER — MONTELUKAST SODIUM 10 MG PO TABS
10.0000 mg | ORAL_TABLET | Freq: Every day | ORAL | Status: DC
  Administered 2021-07-27 – 2021-07-29 (×3): 10 mg via ORAL
  Filled 2021-07-27 (×3): qty 1

## 2021-07-27 MED ORDER — PREGABALIN 75 MG PO CAPS
75.0000 mg | ORAL_CAPSULE | Freq: Three times a day (TID) | ORAL | Status: DC
  Administered 2021-07-27 – 2021-07-30 (×11): 75 mg via ORAL
  Filled 2021-07-27 (×11): qty 1

## 2021-07-27 MED ORDER — INSULIN DETEMIR 100 UNIT/ML ~~LOC~~ SOLN
12.0000 [IU] | Freq: Two times a day (BID) | SUBCUTANEOUS | Status: DC
  Administered 2021-07-27 – 2021-07-28 (×3): 12 [IU] via SUBCUTANEOUS
  Filled 2021-07-27 (×6): qty 0.12

## 2021-07-27 MED ORDER — NICOTINE 14 MG/24HR TD PT24
14.0000 mg | MEDICATED_PATCH | Freq: Every day | TRANSDERMAL | Status: DC
  Administered 2021-07-27 – 2021-07-30 (×4): 14 mg via TRANSDERMAL
  Filled 2021-07-27 (×4): qty 1

## 2021-07-27 MED ORDER — METHYLPREDNISOLONE SODIUM SUCC 40 MG IJ SOLR
40.0000 mg | Freq: Two times a day (BID) | INTRAMUSCULAR | Status: DC
  Administered 2021-07-27 – 2021-07-30 (×7): 40 mg via INTRAVENOUS
  Filled 2021-07-27 (×7): qty 1

## 2021-07-27 MED ORDER — ALBUTEROL (5 MG/ML) CONTINUOUS INHALATION SOLN: 10.0000 mg/h | INHALATION_SOLUTION | Freq: Once | RESPIRATORY_TRACT | Status: DC

## 2021-07-27 MED ORDER — ATORVASTATIN CALCIUM 40 MG PO TABS
80.0000 mg | ORAL_TABLET | Freq: Every day | ORAL | Status: DC
  Administered 2021-07-27 – 2021-07-30 (×4): 80 mg via ORAL
  Filled 2021-07-27 (×4): qty 2

## 2021-07-27 MED ORDER — ACETAMINOPHEN 650 MG RE SUPP: 650.0000 mg | Freq: Four times a day (QID) | RECTAL | Status: DC | PRN

## 2021-07-27 MED ORDER — METHYLPREDNISOLONE SODIUM SUCC 125 MG IJ SOLR
125.0000 mg | Freq: Once | INTRAMUSCULAR | Status: AC
  Administered 2021-07-27: 125 mg via INTRAVENOUS
  Filled 2021-07-27: qty 2

## 2021-07-27 MED ORDER — LACTATED RINGERS IV BOLUS
500.0000 mL | Freq: Once | INTRAVENOUS | Status: AC
  Administered 2021-07-27: 500 mL via INTRAVENOUS

## 2021-07-27 MED ORDER — ARFORMOTEROL TARTRATE 15 MCG/2ML IN NEBU
15.0000 ug | INHALATION_SOLUTION | Freq: Two times a day (BID) | RESPIRATORY_TRACT | Status: DC
  Administered 2021-07-27 – 2021-07-30 (×7): 15 ug via RESPIRATORY_TRACT
  Filled 2021-07-27 (×7): qty 2

## 2021-07-27 MED ORDER — ALBUTEROL SULFATE (2.5 MG/3ML) 0.083% IN NEBU
10.0000 mg/h | INHALATION_SOLUTION | RESPIRATORY_TRACT | Status: DC
  Administered 2021-07-27: 10 mg/h via RESPIRATORY_TRACT

## 2021-07-27 MED ORDER — BUDESONIDE 0.5 MG/2ML IN SUSP
1.0000 mg | Freq: Two times a day (BID) | RESPIRATORY_TRACT | Status: DC
  Administered 2021-07-27 (×2): 1 mg via RESPIRATORY_TRACT
  Filled 2021-07-27 (×2): qty 4

## 2021-07-27 MED ORDER — IPRATROPIUM BROMIDE 0.02 % IN SOLN
0.5000 mg | Freq: Once | RESPIRATORY_TRACT | Status: AC
  Administered 2021-07-27: 0.5 mg via RESPIRATORY_TRACT
  Filled 2021-07-27: qty 2.5

## 2021-07-27 MED ORDER — ACETAMINOPHEN 325 MG PO TABS: 650.0000 mg | ORAL_TABLET | Freq: Four times a day (QID) | ORAL | Status: DC | PRN

## 2021-07-27 MED ORDER — ALBUTEROL SULFATE HFA 108 (90 BASE) MCG/ACT IN AERS: 2.0000 | INHALATION_SPRAY | RESPIRATORY_TRACT | Status: DC | PRN

## 2021-07-27 MED ORDER — CLOPIDOGREL BISULFATE 75 MG PO TABS
75.0000 mg | ORAL_TABLET | Freq: Every day | ORAL | Status: DC
  Administered 2021-07-27 – 2021-07-30 (×4): 75 mg via ORAL
  Filled 2021-07-27 (×4): qty 1

## 2021-07-27 MED ORDER — INSULIN ASPART 100 UNIT/ML IJ SOLN
0.0000 [IU] | Freq: Three times a day (TID) | INTRAMUSCULAR | Status: DC
  Administered 2021-07-27 (×2): 15 [IU] via SUBCUTANEOUS
  Administered 2021-07-28 (×2): 8 [IU] via SUBCUTANEOUS
  Administered 2021-07-28: 11 [IU] via SUBCUTANEOUS
  Administered 2021-07-29: 5 [IU] via SUBCUTANEOUS
  Administered 2021-07-29 (×2): 11 [IU] via SUBCUTANEOUS
  Administered 2021-07-30 (×2): 8 [IU] via SUBCUTANEOUS
  Filled 2021-07-27 (×2): qty 1

## 2021-07-27 MED ORDER — ASPIRIN 81 MG PO TBEC
81.0000 mg | DELAYED_RELEASE_TABLET | Freq: Every day | ORAL | Status: DC
  Administered 2021-07-27 – 2021-07-30 (×4): 81 mg via ORAL
  Filled 2021-07-27 (×4): qty 1

## 2021-07-27 MED ORDER — IPRATROPIUM-ALBUTEROL 0.5-2.5 (3) MG/3ML IN SOLN
3.0000 mL | RESPIRATORY_TRACT | Status: DC
  Administered 2021-07-27 – 2021-07-30 (×16): 3 mL via RESPIRATORY_TRACT
  Filled 2021-07-27 (×16): qty 3

## 2021-07-27 MED ORDER — ENOXAPARIN SODIUM 40 MG/0.4ML IJ SOSY
40.0000 mg | PREFILLED_SYRINGE | INTRAMUSCULAR | Status: DC
  Administered 2021-07-27 – 2021-07-30 (×4): 40 mg via SUBCUTANEOUS
  Filled 2021-07-27 (×4): qty 0.4

## 2021-07-27 MED ORDER — ALBUTEROL SULFATE (2.5 MG/3ML) 0.083% IN NEBU: 2.5000 mg | INHALATION_SOLUTION | RESPIRATORY_TRACT | Status: DC | PRN

## 2021-07-27 MED ORDER — ONDANSETRON HCL 4 MG/2ML IJ SOLN: 4.0000 mg | Freq: Four times a day (QID) | INTRAMUSCULAR | Status: DC | PRN

## 2021-07-27 MED ORDER — INSULIN GLARGINE-YFGN 100 UNIT/ML ~~LOC~~ SOLN
12.0000 [IU] | Freq: Every day | SUBCUTANEOUS | Status: DC
  Filled 2021-07-27: qty 0.12

## 2021-07-27 MED ORDER — ONDANSETRON HCL 4 MG PO TABS: 4.0000 mg | ORAL_TABLET | Freq: Four times a day (QID) | ORAL | Status: DC | PRN

## 2021-07-27 MED ORDER — DOXYCYCLINE HYCLATE 100 MG PO TABS
100.0000 mg | ORAL_TABLET | Freq: Two times a day (BID) | ORAL | Status: DC
Start: 2021-07-27 — End: 2021-07-30
  Administered 2021-07-27 – 2021-07-30 (×7): 100 mg via ORAL
  Filled 2021-07-27 (×7): qty 1

## 2021-07-27 MED ORDER — SODIUM CHLORIDE 0.9 % IV SOLN: INTRAVENOUS | Status: AC

## 2021-07-27 MED ORDER — SODIUM CHLORIDE 0.9 % IV SOLN
500.0000 mg | Freq: Once | INTRAVENOUS | Status: AC
  Administered 2021-07-27: 500 mg via INTRAVENOUS
  Filled 2021-07-27: qty 5

## 2021-07-27 MED ORDER — PANTOPRAZOLE SODIUM 40 MG PO TBEC
40.0000 mg | DELAYED_RELEASE_TABLET | Freq: Two times a day (BID) | ORAL | Status: DC
  Administered 2021-07-27 – 2021-07-30 (×7): 40 mg via ORAL
  Filled 2021-07-27 (×7): qty 1

## 2021-07-27 MED ORDER — INSULIN ASPART 100 UNIT/ML IJ SOLN
0.0000 [IU] | Freq: Every day | INTRAMUSCULAR | Status: DC
  Administered 2021-07-28: 4 [IU] via SUBCUTANEOUS
  Administered 2021-07-29: 5 [IU] via SUBCUTANEOUS

## 2021-07-27 MED ORDER — SODIUM CHLORIDE 0.9 % IV SOLN
1.0000 g | Freq: Once | INTRAVENOUS | Status: AC
  Administered 2021-07-27: 1 g via INTRAVENOUS
  Filled 2021-07-27: qty 10

## 2021-07-27 MED ORDER — IPRATROPIUM-ALBUTEROL 0.5-2.5 (3) MG/3ML IN SOLN
3.0000 mL | Freq: Four times a day (QID) | RESPIRATORY_TRACT | Status: DC
  Administered 2021-07-27 (×2): 3 mL via RESPIRATORY_TRACT
  Filled 2021-07-27 (×2): qty 3

## 2021-07-27 MED ORDER — BUDESONIDE 0.5 MG/2ML IN SUSP
1.0000 mg | Freq: Two times a day (BID) | RESPIRATORY_TRACT | Status: DC
  Administered 2021-07-28 – 2021-07-30 (×5): 1 mg via RESPIRATORY_TRACT
  Filled 2021-07-27 (×6): qty 4

## 2021-07-27 MED ORDER — ALBUTEROL SULFATE (2.5 MG/3ML) 0.083% IN NEBU
10.0000 mg | INHALATION_SOLUTION | Freq: Once | RESPIRATORY_TRACT | Status: AC
Start: 2021-07-27 — End: 2021-07-27
  Administered 2021-07-27: 10 mg via RESPIRATORY_TRACT
  Filled 2021-07-27: qty 12

## 2021-07-27 MED ORDER — DM-GUAIFENESIN ER 30-600 MG PO TB12
1.0000 | ORAL_TABLET | Freq: Two times a day (BID) | ORAL | Status: DC
  Administered 2021-07-27 – 2021-07-30 (×7): 1 via ORAL
  Filled 2021-07-27 (×7): qty 1

## 2021-07-27 NOTE — Assessment & Plan Note (Addendum)
-  Modified carbohydrate diet discussed with patient -Continue holding oral hypoglycemic agents while inpatient -Patient hyperglycemia in the setting of his steroids usage -A1c 9.5 also demonstrating poor control. -Continue sliding scale insulin, Levemir twice a day and NovoLog 3 times a day for meal coverage. -Follow CBGs fluctuation and adjust regimen as required.

## 2021-07-27 NOTE — Assessment & Plan Note (Addendum)
-  Smoking cessation counseling provided -Nicotine patch prescribed at discharge.

## 2021-07-27 NOTE — Assessment & Plan Note (Addendum)
-   In the setting of bronchitis/bronchiectasis and ongoing tobacco abuse. -Still demonstrating difficulty speaking in full sentences, tachypnea/short winded sensation with activity and using 2-3 units, supplementation continuously to maintain O2 sat above 90%. -Continue IV steroids, mucolytic's, bronchodilator management, nebulizer, the use of flutter valve and supportive care. -Wean off oxygen supplementation as tolerated (patient normally uses 2 L just at bedtime). -Smoking cessation counseling provided.

## 2021-07-27 NOTE — ED Notes (Addendum)
Pt on antibiotic 2x weeks ago for as pt states "wheezing". Pt has been having audible wheezing for about a week

## 2021-07-27 NOTE — Assessment & Plan Note (Addendum)
-  Continue PPI. -Dose adjusted to twice a day for better protection and symptom management number (patient reported some reflux symptoms while using steroids).

## 2021-07-27 NOTE — ED Triage Notes (Signed)
Pt with c/o increased sob. Pt on O2 @ 2L [place] and sats were 88% in triage. Pt placed on O2 '@4L'$  and sats 92%. Pt's husband states pt has been sob for "awhile".

## 2021-07-27 NOTE — Assessment & Plan Note (Addendum)
-  Continue statins. -Heart healthy diet encouraged.

## 2021-07-27 NOTE — Assessment & Plan Note (Addendum)
-  Body mass index is 31.18 kg/m. -Low calorie diet, portion control and lifestyle modifications discussed with patient.

## 2021-07-27 NOTE — ED Notes (Signed)
Informed attending of cbg 425 mg/dl ; ordered to recheck cbg in 1 hour

## 2021-07-27 NOTE — ED Notes (Signed)
ED TO INPATIENT HANDOFF REPORT  ED Nurse Name and Phone #:   S Name/Age/Gender Shannon Russell 61 y.o. female Room/Bed: APA02/APA02  Code Status   Code Status: Full Code  Home/SNF/Other Home Patient oriented to: self, place, time, and situation Is this baseline? Yes   Triage Complete: Triage complete  Chief Complaint COPD with acute exacerbation ([place]) [J44.1]  Triage Note Pt with c/o increased sob. Pt on O2 @ 2L [place] and sats were 88% in triage. Pt placed on O2 '@4L'$  and sats 92%. Pt's husband states pt has been sob for "awhile".    Allergies Allergies  Allergen Reactions   Advair Diskus [Fluticasone-Salmeterol] Hives   Iodine Rash and Hives    Other reaction(s): Skin Rashes, Hives    Level of Care/Admitting Diagnosis ED Disposition     ED Disposition  Admit   Condition  --   Comment  Hospital Area: [facility] [[id number]]  Level of Care: Telemetry [5]  Covid Evaluation: Symptomatic Person Under Investigation (PUI) or recent exposure (last 10 days) *Testing Required*  Diagnosis: COPD with acute exacerbation [facility]) [[id number]]  Admitting Physician: Barton Dubois [[id number]]  Attending Physician: Barton Dubois [[id number]]  Certification:: I certify this patient will need inpatient services for at least 2 midnights  Estimated Length of Stay: 2          B Medical/Surgery History Past Medical History:  Diagnosis Date   Arthritis    Asthma    Cancer ([place])    COPD (chronic obstructive pulmonary disease) ([place])    Depression    Diabetes mellitus without complication ([place])    H/O blood clots    PAD (peripheral artery disease) ([place])    Thyroid disease    Past Surgical History:  Procedure Laterality Date   Bypass right leg  2015   CHOLECYSTECTOMY  1981   Mesh implant right leg  2015   THYROIDECTOMY  2013   [place]     A IV Location/Drains/Wounds Patient Lines/Drains/Airways Status     Active Line/Drains/Airways     Name Placement date Placement  time Site Days   Peripheral IV 07/27/21 20 G Anterior;Distal;Right;Upper Arm 07/27/21  0633  Arm  less than 1   Peripheral IV 07/27/21 20 G Left Antecubital 07/27/21  1115  Antecubital  less than 1            Intake/Output Last 24 hours No intake or output data in the 24 hours ending 07/27/21 1919  Labs/Imaging Results for orders placed or performed during the hospital encounter of 07/27/21 (from the past 48 hour(s))  CBC with Differential/Platelet     Status: Abnormal   Collection Time: 07/27/21  6:23 AM  Result Value Ref Range   WBC 13.0 (H) 4.0 - 10.5 K/uL   RBC 4.18 3.87 - 5.11 MIL/uL   Hemoglobin 13.0 12.0 - 15.0 g/dL   HCT 38.7 36.0 - 46.0 %   MCV 92.6 80.0 - 100.0 fL   MCH 31.1 26.0 - 34.0 pg   MCHC 33.6 30.0 - 36.0 g/dL   RDW 14.5 11.5 - 15.5 %   Platelets 284 150 - 400 K/uL   nRBC 0.0 0.0 - 0.2 %   Neutrophils Relative % 63 %   Neutro Abs 8.3 (H) 1.7 - 7.7 K/uL   Lymphocytes Relative 20 %   Lymphs Abs 2.6 0.7 - 4.0 K/uL   Monocytes Relative 5 %   Monocytes Absolute 0.6 0.1 - 1.0 K/uL   Eosinophils Relative 10 %  Eosinophils Absolute 1.3 (H) 0.0 - 0.5 K/uL   Basophils Relative 1 %   Basophils Absolute 0.1 0.0 - 0.1 K/uL   Immature Granulocytes 1 %   Abs Immature Granulocytes 0.13 (H) 0.00 - 0.07 K/uL    Comment: Performed at [facility], [address], [place], [place] [address]  Basic metabolic panel     Status: Abnormal   Collection Time: 07/27/21  6:23 AM  Result Value Ref Range   Sodium 136 135 - 145 mmol/L   Potassium 4.1 3.5 - 5.1 mmol/L   Chloride 102 98 - 111 mmol/L   CO2 23 22 - 32 mmol/L   Glucose, Bld 279 (H) 70 - 99 mg/dL    Comment: Glucose reference range applies only to samples taken after fasting for at least 8 hours.   BUN 29 (H) 6 - 20 mg/dL   Creatinine, Ser 1.87 (H) 0.44 - 1.00 mg/dL   Calcium 9.2 8.9 - 10.3 mg/dL   GFR, Estimated 30 (L) >60 mL/min    Comment: (NOTE) Calculated using the CKD-EPI Creatinine Equation (2021)    Anion  gap 11 5 - 15    Comment: Performed at [facility], [address] [place], [place] [address]  Troponin I (High Sensitivity)     Status: None   Collection Time: 07/27/21  6:23 AM  Result Value Ref Range   Troponin I (High Sensitivity) 5 <18 ng/L    Comment: (NOTE) Elevated high sensitivity troponin I (hsTnI) values and significant  changes across serial measurements may suggest ACS but many other  chronic and acute conditions are known to elevate hsTnI results.  Refer to the "Links" section for chest pain algorithms and additional  guidance. Performed at [facility], [address], [place], [place] [address]   Magnesium     Status: None   Collection Time: 07/27/21  6:23 AM  Result Value Ref Range   Magnesium 1.8 1.7 - 2.4 mg/dL    Comment: Performed at [facility], [address] [place], [place] [address]  Phosphorus     Status: Abnormal   Collection Time: 07/27/21  6:23 AM  Result Value Ref Range   Phosphorus 5.0 (H) 2.5 - 4.6 mg/dL    Comment: Performed at [facility], [address] [place], [place] [address]  Brain natriuretic peptide     Status: None   Collection Time: 07/27/21  6:34 AM  Result Value Ref Range   B Natriuretic Peptide 37.0 0.0 - 100.0 pg/mL    Comment: Performed at [facility], [address], [place], [place] [address]  Lactic acid, plasma     Status: Abnormal   Collection Time: 07/27/21  8:00 AM  Result Value Ref Range   Lactic Acid, Venous 2.8 (HH) 0.5 - 1.9 mmol/L    Comment: CRITICAL RESULT CALLED TO, READ BACK BY AND VERIFIED WITH: LONG L @ 0819 ON [id number] BY HENDERSON L Performed at [facility], [address] [place],  [address]   Urinalysis, Routine w reflex microscopic     Status: Abnormal   Collection Time: 07/27/21  8:26 AM  Result Value Ref Range   Color, Urine STRAW (A) YELLOW   APPearance CLEAR CLEAR   Specific Gravity, Urine 1.006 1.005 - 1.030   pH 5.0 5.0 - 8.0   Glucose, UA >=500 (A) NEGATIVE mg/dL   Hgb urine dipstick  SMALL (A) NEGATIVE   Bilirubin Urine NEGATIVE NEGATIVE   Ketones, ur NEGATIVE NEGATIVE mg/dL   Protein, ur NEGATIVE  NEGATIVE mg/dL   Nitrite NEGATIVE NEGATIVE   Leukocytes,Ua NEGATIVE NEGATIVE   WBC, UA 0-5 0 - 5 WBC/hpf   Bacteria, UA NONE SEEN NONE SEEN   Squamous Epithelial / LPF 0-5 0 - 5    Comment: Performed at [facility], [address], [place], [place] [address]  Blood culture (routine x 2)     Status: None (Preliminary result)   Collection Time: 07/27/21  9:08 AM   Specimen: BLOOD  Result Value Ref Range   Specimen Description BLOOD LEFT ANTECUBITAL    Special Requests      BOTTLES DRAWN AEROBIC AND ANAEROBIC LEFT ANTECUBITAL Performed at [facility], [address] [place], [place] [address]    Culture PENDING    Report Status PENDING   Blood culture (routine x 2)     Status: None (Preliminary result)   Collection Time: 07/27/21  9:08 AM   Specimen: BLOOD  Result Value Ref Range   Specimen Description BLOOD BLOOD RIGHT FOREARM    Special Requests      BOTTLES DRAWN AEROBIC AND ANAEROBIC Blood Culture adequate volume Performed at [facility], [address], [place], [place] [address]    Culture PENDING    Report Status PENDING   Lactic acid, plasma     Status: Abnormal   Collection Time: 07/27/21 11:36 AM  Result Value Ref Range   Lactic Acid, Venous 4.5 (HH) 0.5 - 1.9 mmol/L    Comment: CRITICAL RESULT CALLED TO, READ BACK BY AND VERIFIED WITHGertha Calkin RN [phone] Marcos Eke Performed at [facility], [address], [place], [place] [address]   SARS Coronavirus 2 by RT PCR (hospital order, performed in [place] hospital lab) *cepheid single result test* Anterior Nasal Swab     Status: None   Collection Time: 07/27/21 11:36 AM   Specimen: Anterior Nasal Swab  Result Value Ref Range   SARS Coronavirus 2 by RT PCR NEGATIVE NEGATIVE    Comment: (NOTE) SARS-CoV-2 target nucleic acids are NOT DETECTED.  The SARS-CoV-2 RNA is generally detectable in upper  and lower respiratory specimens during the acute phase of infection. The lowest concentration of SARS-CoV-2 viral copies this assay can detect is 250 copies / mL. A negative result does not preclude SARS-CoV-2 infection and should not be used as the sole basis for treatment or other patient management decisions.  A negative result may occur with improper specimen collection / handling, submission of specimen other than nasopharyngeal swab, presence of viral mutation(s) within the areas targeted by this assay, and inadequate number of viral copies (<250 copies / mL). A negative result must be combined with clinical observations, patient history, and epidemiological information.  Fact Sheet for Patients:   https://www.patel.info/  Fact Sheet for Healthcare Providers: https://hall.com/  This test is not yet approved or  cleared by the [place] FDA and has been authorized for detection and/or diagnosis of SARS-CoV-2 by FDA under an Emergency Use Authorization (EUA).  This EUA will remain in effect (meaning this test can be used) for the duration of the COVID-19 declaration under Section 564(b)(1) of the Act, 21 U.S.C. section 360bbb-3(b)(1), unless the authorization is terminated or revoked sooner.  Performed at [facility], [address], [place], [place] [address]   CBG monitoring, ED     Status: Abnormal   Collection Time: 07/27/21 12:00 PM  Result Value Ref Range   Glucose-Capillary 427 (H) 70 - 99 mg/dL    Comment: Glucose reference range applies only  to samples taken after fasting for at least 8 hours.  CBG monitoring, ED     Status: Abnormal   Collection Time: 07/27/21  2:26 PM  Result Value Ref Range   Glucose-Capillary 425 (H) 70 - 99 mg/dL    Comment: Glucose reference range applies only to samples taken after fasting for at least 8 hours.  CBG monitoring, ED     Status: Abnormal   Collection Time: 07/27/21  4:57 PM  Result  Value Ref Range   Glucose-Capillary 415 (H) 70 - 99 mg/dL    Comment: Glucose reference range applies only to samples taken after fasting for at least 8 hours.   DG Chest Portable 1 View  Result Date: 07/27/2021 CLINICAL DATA:  Shortness of breath EXAM: PORTABLE CHEST 1 VIEW COMPARISON:  06/07/2020 FINDINGS: Normal heart size and mediastinal contours. Bilateral airway cuffing, airway thickening with seen on a prior chest CT. No acute infiltrate or edema. No effusion or pneumothorax. No acute osseous findings. IMPRESSION: Bilateral bronchitic markings. Electronically Signed   By: Jorje Guild M.D.   On: 07/27/2021 06:13    Pending Labs Unresulted Labs (From admission, onward)     Start     Ordered   08/03/21 0500  Creatinine, serum  (enoxaparin (LOVENOX)    CrCl >/= 30 ml/min)  Weekly,   R     Comments: while on enoxaparin therapy    07/27/21 1147   07/28/21 0500  Comprehensive metabolic panel  Tomorrow morning,   R        07/27/21 1147   07/28/21 0500  CBC  Tomorrow morning,   R        07/27/21 1147   07/27/21 1147  Hemoglobin A1c  Once,   R        07/27/21 1147   07/27/21 1139  HIV Antibody (routine testing w rflx)  (HIV Antibody (Routine testing w reflex) panel)  Once,   R        07/27/21 1147            Vitals/Pain Today's Vitals   07/27/21 1430 07/27/21 1500 07/27/21 1651 07/27/21 1652  BP: 114/71 112/70    Pulse: (!) 111 (!) 108    Resp: (!) 29 16    Temp:   97.8 F (36.6 C)   TempSrc:   Oral   SpO2: 93% 96%  100%  Weight:      Height:      PainSc:        Isolation Precautions No active isolations  Medications Medications  albuterol (VENTOLIN HFA) 108 (90 Base) MCG/ACT inhaler 2 puff (has no administration in time range)  albuterol (PROVENTIL) (2.5 MG/3ML) 0.083% nebulizer solution (10 mg/hr Nebulization New Bag/Given 07/27/21 0937)  aspirin EC tablet 81 mg (81 mg Oral Given 07/27/21 1244)  atorvastatin (LIPITOR) tablet 80 mg (80 mg Oral Given 07/27/21 1245)   budesonide (PULMICORT) nebulizer solution 1 mg (1 mg Nebulization Given 07/27/21 1309)  clopidogrel (PLAVIX) tablet 75 mg (75 mg Oral Given 07/27/21 1245)  levothyroxine (SYNTHROID) tablet 100 mcg (has no administration in time range)  pantoprazole (PROTONIX) EC tablet 40 mg (40 mg Oral Given 07/27/21 1245)  montelukast (SINGULAIR) tablet 10 mg (has no administration in time range)  pregabalin (LYRICA) capsule 75 mg (75 mg Oral Given 07/27/21 1706)  albuterol (PROVENTIL) (2.5 MG/3ML) 0.083% 3 mL aerosol nebs (has no administration in time range)  arformoterol (BROVANA) nebulizer solution 15 mcg (15 mcg Nebulization Given 07/27/21 1309)  dextromethorphan-guaiFENesin ([place]  DM) 30-600 MG per 12 hr tablet 1 tablet (1 tablet Oral Given 07/27/21 1245)  doxycycline (VIBRA-TABS) tablet 100 mg (100 mg Oral Given 07/27/21 1245)  nicotine (NICODERM CQ - dosed in mg/24 hours) patch 14 mg (14 mg Transdermal Patch Applied 07/27/21 1316)  enoxaparin (LOVENOX) injection 40 mg (40 mg Subcutaneous Given 07/27/21 1418)  0.9 %  sodium chloride infusion ( Intravenous New Bag/Given 07/27/21 1418)  acetaminophen (TYLENOL) tablet 650 mg (has no administration in time range)    Or  acetaminophen (TYLENOL) suppository 650 mg (has no administration in time range)  ondansetron (ZOFRAN) tablet 4 mg (has no administration in time range)    Or  ondansetron (ZOFRAN) injection 4 mg (has no administration in time range)  methylPREDNISolone sodium succinate (SOLU-MEDROL) 40 mg/mL injection 40 mg (40 mg Intravenous Given 07/27/21 1245)  insulin aspart (novoLOG) injection 0-5 Units (has no administration in time range)  insulin aspart (novoLOG) injection 0-15 Units (15 Units Subcutaneous Given 07/27/21 1705)  insulin detemir (LEVEMIR) injection 12 Units (12 Units Subcutaneous Given 07/27/21 1505)  ipratropium-albuterol (DUONEB) 0.5-2.5 (3) MG/3ML nebulizer solution 3 mL (has no administration in time range)  methylPREDNISolone sodium  succinate (SOLU-MEDROL) 125 mg/2 mL injection 125 mg (125 mg Intravenous Given 07/27/21 0636)  albuterol (PROVENTIL) (2.5 MG/3ML) 0.083% nebulizer solution 10 mg (10 mg Nebulization Given 07/27/21 0629)  lactated ringers bolus 500 mL (0 mLs Intravenous Stopped 07/27/21 0918)  ipratropium (ATROVENT) nebulizer solution 0.5 mg (0.5 mg Nebulization Given 07/27/21 0852)  cefTRIAXone (ROCEPHIN) 1 g in sodium chloride 0.9 % 100 mL IVPB (0 g Intravenous Stopped 07/27/21 1037)  azithromycin (ZITHROMAX) 500 mg in sodium chloride 0.9 % 250 mL IVPB (0 mg Intravenous Stopped 07/27/21 1200)    Mobility walks Moderate fall risk   Focused Assessments    R Recommendations: See Admitting Provider Note  Report given to:   Additional Notes:

## 2021-07-27 NOTE — ED Provider Notes (Signed)
[facility] EMERGENCY DEPARTMENT Provider Note   CSN: [id number] Arrival date & time: 07/27/21  7673     History  Chief Complaint  Patient presents with   Shortness of Breath    Shannon Russell is a 61 y.o. female.  Patient presents to the emergency department for evaluation of difficulty breathing.  Patient reports that she was not been breathing well for more than 2 weeks.  Her primary doctor put her on steroids and an antibiotic but she did not improve much.  For the last week or shortness of breath and wheezing has worsened.  She normally uses oxygen only at night.       Home Medications Prior to Admission medications   Medication Sig Start Date End Date Taking? Authorizing Provider  albuterol (2.5 MG/3ML) 0.083% NEBU 3 mL, albuterol (5 MG/ML) 0.5% NEBU 0.5 mL Inhale 2.5 mg into the lungs every 4 (four) hours as needed (wheezing, SOB).    [provider]  albuterol (PROVENTIL HFA;VENTOLIN HFA) 108 (90 Base) MCG/ACT inhaler Inhale 2 puffs into the lungs every 6 (six) hours as needed for wheezing.    [provider]  aspirin EC 81 MG tablet Take 81 mg by mouth daily. Swallow whole.    [provider]  atorvastatin (LIPITOR) 80 MG tablet Take 80 mg by mouth daily. 06/03/20   [provider]  baclofen (LIORESAL) 10 MG tablet Take 10 mg by mouth every 12 (twelve) hours as needed. 06/05/20   [provider]  budesonide (PULMICORT) 0.5 MG/2ML nebulizer solution Take 2 mLs (0.5 mg total) by nebulization 2 (two) times daily. 02/05/19   Tyler Pita, MD  Cholecalciferol 1.25 MG (50000 UT) capsule Vitamin D3 1.25 MG (50000 UT) Oral Capsule QTY: 0 capsule Days: 0 Refills: 0  Written: 02/09/20 Patient Instructions: qaw 02/09/20   [provider]  clopidogrel (PLAVIX) 75 MG tablet Take 75 mg by mouth daily.    [provider]  furosemide (LASIX) 40 MG tablet Take 40 mg by mouth daily. 06/03/20   [provider]  insulin  glargine (LANTUS SOLOSTAR) 100 UNIT/ML Solostar Pen Inject 16 Units into the skin at bedtime. 02/26/20   [provider]  ipratropium-albuterol (DUONEB) 0.5-2.5 (3) MG/3ML SOLN Take 3 mLs by nebulization every 6 (six) hours as needed. 04/25/20   Flora Lipps, MD  levothyroxine (SYNTHROID) 125 MCG tablet Take 125 mcg by mouth every morning. 06/03/20   [provider]  metFORMIN (GLUCOPHAGE-XR) 500 MG 24 hr tablet Take 1,000 mg by mouth at bedtime. 01/13/19   [provider]  montelukast (SINGULAIR) 10 MG tablet Take 1 tablet by mouth at bedtime. 06/03/20   [provider]  pantoprazole (PROTONIX) 40 MG tablet Take 1 tablet (40 mg total) by mouth daily. 03/04/19   Tyler Pita, MD  pregabalin (LYRICA) 75 MG capsule Take 75 mg by mouth 3 (three) times daily.    [provider]  temazepam (RESTORIL) 15 MG capsule Take 15 mg by mouth at bedtime as needed. 06/05/20   [provider]      Allergies    Advair diskus [fluticasone-salmeterol] and Iodine    Review of Systems   Review of Systems  Physical Exam Updated Vital Signs BP 105/65   Pulse (!) 114   Temp 98.2 F (36.8 C) (Oral)   Resp 16   Ht '5\' 3"'$  (1.6 m)   Wt 79.8 kg   SpO2 94%   BMI 31.18 kg/m  Physical Exam Vitals  and nursing note reviewed.  Constitutional:      General: She is not in acute distress.    Appearance: She is well-developed.  HENT:     Head: Normocephalic and atraumatic.     Mouth/Throat:     Mouth: Mucous membranes are moist.  Eyes:     General: Vision grossly intact. Gaze aligned appropriately.     Extraocular Movements: Extraocular movements intact.     Conjunctiva/sclera: Conjunctivae normal.  Cardiovascular:     Rate and Rhythm: Normal rate and regular rhythm.     Pulses: Normal pulses.     Heart sounds: Normal heart sounds, S1 normal and S2 normal. No murmur heard.    No friction rub. No gallop.  Pulmonary:     Effort: Accessory muscle usage and  prolonged expiration present. No respiratory distress.     Breath sounds: Decreased air movement present. Decreased breath sounds and wheezing present.  Abdominal:     General: Bowel sounds are normal.     Palpations: Abdomen is soft.     Tenderness: There is no abdominal tenderness. There is no guarding or rebound.     Hernia: No hernia is present.  Musculoskeletal:        General: No swelling.     Cervical back: Full passive range of motion without pain, normal range of motion and neck supple. No spinous process tenderness or muscular tenderness. Normal range of motion.     Right lower leg: No edema.     Left lower leg: No edema.  Skin:    General: Skin is warm and dry.     Capillary Refill: Capillary refill takes less than 2 seconds.     Findings: No ecchymosis, erythema, rash or wound.  Neurological:     General: No focal deficit present.     Mental Status: She is alert and oriented to person, place, and time.     GCS: GCS eye subscore is 4. GCS verbal subscore is 5. GCS motor subscore is 6.     Cranial Nerves: Cranial nerves 2-12 are intact.     Sensory: Sensation is intact.     Motor: Motor function is intact.     Coordination: Coordination is intact.  Psychiatric:        Attention and Perception: Attention normal.        Mood and Affect: Mood normal.        Speech: Speech normal.        Behavior: Behavior normal.     ED Results / Procedures / Treatments   Labs (all labs ordered are listed, but only abnormal results are displayed) Labs Reviewed  CBC WITH DIFFERENTIAL/PLATELET  BASIC METABOLIC PANEL  BRAIN NATRIURETIC PEPTIDE  TROPONIN I (HIGH SENSITIVITY)    EKG EKG Interpretation  Date/Time:  Thursday July 27 2021 05:46:33 EDT Ventricular Rate:  115 PR Interval:  139 QRS Duration: 93 QT Interval:  342 QTC Calculation: 473 R Axis:   90 Text Interpretation: Sinus tachycardia Multiple ventricular premature complexes Consider right atrial enlargement Borderline  right axis deviation Confirmed by Orpah Greek [phone]) on 07/27/2021 5:48:56 AM  Radiology DG Chest Portable 1 View  Result Date: 07/27/2021 CLINICAL DATA:  Shortness of breath EXAM: PORTABLE CHEST 1 VIEW COMPARISON:  06/07/2020 FINDINGS: Normal heart size and mediastinal contours. Bilateral airway cuffing, airway thickening with seen on a prior chest CT. No acute infiltrate or edema. No effusion or pneumothorax. No acute osseous findings. IMPRESSION: Bilateral bronchitic markings. Electronically Signed  By: Jorje Guild M.D.   On: 07/27/2021 06:13    Procedures Procedures    Medications Ordered in ED Medications  albuterol (VENTOLIN HFA) 108 (90 Base) MCG/ACT inhaler 2 puff (has no administration in time range)  methylPREDNISolone sodium succinate (SOLU-MEDROL) 125 mg/2 mL injection 125 mg (has no administration in time range)  albuterol (PROVENTIL) (2.5 MG/3ML) 0.083% nebulizer solution 10 mg (10 mg Nebulization Given 07/27/21 6438)    ED Course/ Medical Decision Making/ A&P                           Medical Decision Making Amount and/or Complexity of Data Reviewed Labs: ordered. Radiology: ordered.  Risk Prescription drug management.   Presents to the emergency department with increasing shortness of breath.  Patient course of antibiotics and steroids 2 weeks ago, saw no improvement.  Patient with decreased air movement and fairly significant wheezing upon arrival to the emergency department today.  Oxygen saturations were 88% at arrival, placed on nasal cannula oxygen.  She does not normally use oxygen during the day.  Chest x-ray does not show evidence of pneumonia.  We will perform additional work-up including cardiac evaluation, blood work.  Patient administered Solu-Medrol and will initiate continuous nebulizer treatment.  Will sign out to oncoming physician to follow-up on work-up and recheck patient's to determine if she requires  hospitalization.        Final Clinical Impression(s) / ED Diagnoses Final diagnoses:  COPD exacerbation [facility])    Rx / DC Orders ED Discharge Orders     None         Quintyn Dombek, Gwenyth Allegra, MD 07/27/21 [phone]

## 2021-07-27 NOTE — ED Notes (Signed)
RT informed nurse that pharmacy has been notified x 3 about neb tx needed for pt.

## 2021-07-27 NOTE — ED Notes (Signed)
Admitting MD paged to notify of cbg and receive orders. Awaiting call back

## 2021-07-27 NOTE — ED Notes (Signed)
Attending Dyann Kief MD paged again. Awaiting call back

## 2021-07-27 NOTE — ED Provider Notes (Signed)
Signed out to admit to hospitalists with copd exacerbation.   Pt notes still sob/wheezing. Is wheezing bil. Additional albuterol and atrovent neb.   No chest pain. Has noted recent increased cough. Is congested on exam/rhonchi and wheezing. Cultures sent. Dose of iv abx given.   Hospitalists consulted for admission.    Lajean Saver, MD 07/27/21 [phone]

## 2021-07-27 NOTE — H&P (Signed)
History and Physical    Patient: Shannon Russell [id number]:[id number] DOB: 11-02-1960 DOA: 07/27/2021 DOS: the patient was seen and examined on 07/27/2021 PCP: [facility]   Patient coming from: Home  Chief Complaint:  Chief Complaint  Patient presents with   Shortness of Breath   HPI: Shannon Russell is a 61 y.o. female with medical history significant of tobacco abuse, type 2 diabetes mellitus with nephropathy, COPD/asthma, chronic respiratory failure with hypoxia (using oxygen supplementation mainly at bedtime and on daily basis), gastroesophageal reflux disease, depression, insomnia and peripheral arterial disease; who presented to the hospital complaining of intermittent productive coughing spells and worsening shortness of breath.  Patient reports symptoms have been present for the last 4 days or so and worsening.  She has been in need of using oxygen supplementation during the days to assist with her symptoms.  Noticed worsening wheezing and noted improvement with her home bronchodilator management.  Patient reports no chest pain, no nausea, no vomiting, no dysuria, no focal weakness, no hemoptysis, no hematemesis, no hematuria, no melena, no hematochezia, no sick contacts or any other complaints.  In the ED work-up demonstrated acute on chronic bronchitic changes on chest x-ray and physical examination with diffuse expiratory wheezes/rhonchi and the need of 3 L nasal cannula supplementation to maintain O2 sat above 90%.    On room air oxygen saturation in the mid 80s.  TRH has been contacted to place patient in the hospital for further evaluation and management.  Review of Systems: As mentioned in the history of present illness. All other systems reviewed and are negative. Past Medical History:  Diagnosis Date   Arthritis    Asthma    Cancer ([place])    COPD (chronic obstructive pulmonary disease) ([facility])    Depression    Diabetes mellitus without complication ([place])    H/O  blood clots    PAD (peripheral artery disease) ([place])    Thyroid disease    Past Surgical History:  Procedure Laterality Date   Bypass right leg  2015   CHOLECYSTECTOMY  1981   Mesh implant right leg  2015   THYROIDECTOMY  2013   [place]   Social History:  reports that she has been smoking cigarettes. She has a 11.25 pack-year smoking history. She has never used smokeless tobacco. She reports that she does not currently use alcohol. She reports that she does not currently use drugs.  Allergies  Allergen Reactions   Advair Diskus [Fluticasone-Salmeterol] Hives   Iodine Rash and Hives    Other reaction(s): Skin Rashes, Hives    Family History  Problem Relation Age of Onset   Heart failure Mother    Depression Mother    Peripheral Artery Disease Sister    Cancer Sister    Clotting disorder Sister    Depression Sister    Thyroid disease Sister     Prior to Admission medications   Medication Sig Start Date End Date Taking? Authorizing Provider  albuterol (2.5 MG/3ML) 0.083% NEBU 3 mL, albuterol (5 MG/ML) 0.5% NEBU 0.5 mL Inhale 2.5 mg into the lungs every 4 (four) hours as needed (wheezing, SOB).   Yes [provider]  albuterol (PROVENTIL HFA;VENTOLIN HFA) 108 (90 Base) MCG/ACT inhaler Inhale 2 puffs into the lungs every 6 (six) hours as needed for wheezing.   Yes [provider]  aspirin EC 81 MG tablet Take 81 mg by mouth daily. Swallow whole.   Yes [provider]  atorvastatin (LIPITOR) 80 MG  tablet Take 80 mg by mouth daily. 06/03/20  Yes [provider]  baclofen (LIORESAL) 10 MG tablet Take 10 mg by mouth every 12 (twelve) hours as needed for muscle spasms. 06/05/20  Yes [provider]  Cholecalciferol 1.25 MG (50000 UT) capsule Vitamin D3 1.25 MG (50000 UT) Oral Capsule QTY: 0 capsule Days: 0 Refills: 0  Written: 02/09/20 Patient Instructions: qaw 02/09/20  Yes [provider]  clopidogrel (PLAVIX) 75 MG tablet  Take 75 mg by mouth daily.   Yes [provider]  fexofenadine (ALLEGRA) 180 MG tablet Take 180 mg by mouth every morning. 05/16/21  Yes [provider]  furosemide (LASIX) 40 MG tablet Take 40 mg by mouth daily. 06/03/20  Yes [provider]  insulin glargine (LANTUS SOLOSTAR) 100 UNIT/ML Solostar Pen Inject 16 Units into the skin at bedtime. 02/26/20  Yes [provider]  ipratropium-albuterol (DUONEB) 0.5-2.5 (3) MG/3ML SOLN Take 3 mLs by nebulization every 6 (six) hours as needed. 04/25/20  Yes Flora Lipps, MD  levothyroxine (SYNTHROID) 100 MCG tablet Take 100 mcg by mouth every morning. 07/23/21  Yes [provider]  lisinopril (ZESTRIL) 5 MG tablet Take 5 mg by mouth daily. 05/16/21  Yes [provider]  metFORMIN (GLUCOPHAGE) 1000 MG tablet Take 1,000 mg by mouth every morning. 07/22/21  Yes [provider]  montelukast (SINGULAIR) 10 MG tablet Take 1 tablet by mouth at bedtime. 06/03/20  Yes [provider]  pantoprazole (PROTONIX) 40 MG tablet Take 1 tablet (40 mg total) by mouth daily. 03/04/19  Yes Tyler Pita, MD  pregabalin (LYRICA) 75 MG capsule Take 75 mg by mouth 3 (three) times daily.   Yes [provider]  temazepam (RESTORIL) 15 MG capsule Take 15 mg by mouth at bedtime as needed. 06/05/20  Yes [provider]  budesonide (PULMICORT) 0.5 MG/2ML nebulizer solution Take 2 mLs (0.5 mg total) by nebulization 2 (two) times daily. Patient not taking: Reported on 07/27/2021 02/05/19   Tyler Pita, MD    Physical Exam: Vitals:   07/27/21 1000 07/27/21 1015 07/27/21 1100 07/27/21 1141  BP: 123/63  (!) 95/58 (!) 103/58  Pulse:  (!) 117 (!) 109 (!) 108  Resp: (!) '22 19 15 19  '$ Temp:      TempSrc:      SpO2:  100% 100% 98%  Weight:      Height:       General exam: Alert, awake, oriented x 3; demonstrating difficulty speaking in full sentences, expressing short winded sensation with minimal  activity and having intermittent productive coughing spells. Respiratory system: Positive rhonchi and expiratory wheezing appreciated bilaterally; intermittent tachypnea with activity reported.  No using accessory muscles. Cardiovascular system:RRR. No rubs or gallops; no JVD. Gastrointestinal system: Abdomen is nondistended, soft and nontender. No organomegaly or masses felt. Normal bowel sounds heard. Central nervous system: Alert and oriented. No focal neurological deficits. Extremities: No cyanosis, clubbing or edema. Skin: No petechiae. Psychiatry: Judgement and insight appear normal. Mood & affect appropriate.   Data Reviewed: COVID/flu PCR negative CBC: Demonstrating WBC of 13.0, hemoglobin 13.0, platelets count 627 K Basic metabolic panel: Sodium 035, potassium 4.1, BUN 29, creatinine 1.87 BNP 37.0 Phosphorus 5.0 Negative troponin Lactic acid 2.8   Assessment and Plan: * COPD with acute exacerbation (HCC) - In the setting of bronchitis/bronchiectasis and ongoing tobacco abuse -Cessation counseling provided -Treatment initiated with nebulizer management, as needed bronchodilators, steroids, antibiotics and oxygen supplementation -Mucolytic's, flutter valve and supportive care  will be provided -Follow clinical response and wean off oxygen supplementation as tolerated.  Hypothyroidism - Continue Synthroid. -Recent TSH as an outpatient within normal limits.  Class 1 obesity -Body mass index is 31.18 kg/m. -Low calorie diet, portion control and lifestyle modifications discussed with patient.  Acute renal failure superimposed on stage 2 chronic kidney disease ([place]) - In the setting of dehydration/prerenal azotemia -Patient reporting no dysuria or hematuria. -Provide fluid resuscitation, avoid nephrotoxic agents, hypotension and contrast. -Follow renal function trend.  Hyperlipidemia - Continue statins.  GERD (gastroesophageal reflux disease) - Continue PPI.  Type 2  diabetes mellitus with diabetic peripheral angiopathy without gangrene ([place]) - Modified carbohydrate diet discussed with patient -Holding oral hypoglycemic agents while inpatient -Continue the use of sliding scale insulin and long-acting insulin -Anticipating CBGs to be elevated with the use of his steroids -Follow blood sugar levels and further adjust hypoglycemic regimen as required.  Nicotine dependence - Smoking Cessation counseling provided -Nicotine patch will be used while inpatient.     Advance Care Planning:   Code Status: Full Code   Consults: None  Family Communication: Husband at bedside  Severity of Illness: The appropriate patient status for this patient is INPATIENT. Inpatient status is judged to be reasonable and necessary in order to provide the required intensity of service to ensure the patient's safety. The patient's presenting symptoms, physical exam findings, and initial radiographic and laboratory data in the context of their chronic comorbidities is felt to place them at high risk for further clinical deterioration. Furthermore, it is not anticipated that the patient will be medically stable for discharge from the hospital within 2 midnights of admission.   * I certify that at the point of admission it is my clinical judgment that the patient will require inpatient hospital care spanning beyond 2 midnights from the point of admission due to high intensity of service, high risk for further deterioration and high frequency of surveillance required.*  Author: Barton Dubois, MD 07/27/2021 12:01 PM  For on call review [email or web address].

## 2021-07-27 NOTE — Assessment & Plan Note (Addendum)
-  In the setting of dehydration/prerenal azotemia -Patient reporting no dysuria or hematuria. -Reports good urine output. -Renal function improved and pretty much back to her baseline based on GFR -Advised to maintain adequate hydration -Safe to resume home diuretic and lisinopril on 08/01/2021. -Reassess basic metabolic panel at follow-up visit to check renal function trend/stability.

## 2021-07-27 NOTE — Assessment & Plan Note (Addendum)
-  Continue Synthroid. -Recent outpatient TSH evaluation within normal limits. -Recommended repeat thyroid panel in 3-4 months.

## 2021-07-28 DIAGNOSIS — K219 Gastro-esophageal reflux disease without esophagitis: Secondary | ICD-10-CM | POA: Diagnosis not present

## 2021-07-28 DIAGNOSIS — N179 Acute kidney failure, unspecified: Secondary | ICD-10-CM | POA: Diagnosis not present

## 2021-07-28 DIAGNOSIS — E872 Acidosis, unspecified: Secondary | ICD-10-CM | POA: Diagnosis present

## 2021-07-28 DIAGNOSIS — J441 Chronic obstructive pulmonary disease with (acute) exacerbation: Secondary | ICD-10-CM | POA: Diagnosis not present

## 2021-07-28 DIAGNOSIS — R7989 Other specified abnormal findings of blood chemistry: Secondary | ICD-10-CM | POA: Diagnosis not present

## 2021-07-28 LAB — GLUCOSE, CAPILLARY
Glucose-Capillary: 291 mg/dL — ABNORMAL HIGH (ref 70–99)
Glucose-Capillary: 294 mg/dL — ABNORMAL HIGH (ref 70–99)
Glucose-Capillary: 338 mg/dL — ABNORMAL HIGH (ref 70–99)
Glucose-Capillary: 312 mg/dL — ABNORMAL HIGH (ref 70–99)

## 2021-07-28 LAB — COMPREHENSIVE METABOLIC PANEL
ALT: 22 U/L (ref 0–44)
AST: 14 U/L — ABNORMAL LOW (ref 15–41)
Albumin: 3.1 g/dL — ABNORMAL LOW (ref 3.5–5.0)
Alkaline Phosphatase: 85 U/L (ref 38–126)
Anion gap: 9 (ref 5–15)
BUN: 31 mg/dL — ABNORMAL HIGH (ref 6–20)
CO2: 21 mmol/L — ABNORMAL LOW (ref 22–32)
Calcium: 8.9 mg/dL (ref 8.9–10.3)
Chloride: 109 mmol/L (ref 98–111)
Creatinine, Ser: 1.45 mg/dL — ABNORMAL HIGH (ref 0.44–1.00)
GFR, Estimated: 41 mL/min — ABNORMAL LOW (ref >60)
Glucose, Bld: 288 mg/dL — ABNORMAL HIGH (ref 70–99)
Potassium: 4.1 mmol/L (ref 3.5–5.1)
Sodium: 139 mmol/L (ref 135–145)
Total Bilirubin: 0.3 mg/dL (ref 0.3–1.2)
Total Protein: 6.5 g/dL (ref 6.5–8.1)

## 2021-07-28 LAB — CBC
HCT: 33.9 % — ABNORMAL LOW (ref 36.0–46.0)
Hemoglobin: 11.2 g/dL — ABNORMAL LOW (ref 12.0–15.0)
MCH: 31.2 pg (ref 26.0–34.0)
MCHC: 33 g/dL (ref 30.0–36.0)
MCV: 94.4 fL (ref 80.0–100.0)
Platelets: 243 10*3/uL (ref 150–400)
RBC: 3.59 MIL/uL — ABNORMAL LOW (ref 3.87–5.11)
RDW: 13.9 % (ref 11.5–15.5)
WBC: 14.6 10*3/uL — ABNORMAL HIGH (ref 4.0–10.5)
nRBC: 0 % (ref 0.0–0.2)

## 2021-07-28 MED ORDER — INSULIN DETEMIR 100 UNIT/ML ~~LOC~~ SOLN
17.0000 [IU] | Freq: Two times a day (BID) | SUBCUTANEOUS | Status: DC
  Administered 2021-07-28 – 2021-07-29 (×2): 17 [IU] via SUBCUTANEOUS
  Filled 2021-07-28 (×4): qty 0.17

## 2021-07-28 MED ORDER — SODIUM CHLORIDE 0.9 % IV SOLN: INTRAVENOUS | Status: AC

## 2021-07-28 MED ORDER — INSULIN ASPART 100 UNIT/ML IJ SOLN
18.0000 [IU] | Freq: Once | INTRAMUSCULAR | Status: AC
Start: 2021-07-28 — End: 2021-07-28
  Administered 2021-07-28: 18 [IU] via SUBCUTANEOUS

## 2021-07-28 MED ORDER — INSULIN ASPART 100 UNIT/ML IJ SOLN
3.0000 [IU] | Freq: Three times a day (TID) | INTRAMUSCULAR | Status: DC
  Administered 2021-07-28 – 2021-07-30 (×6): 3 [IU] via SUBCUTANEOUS

## 2021-07-28 NOTE — Inpatient Diabetes Management (Signed)
Inpatient Diabetes Program Recommendations  AACE/ADA: New Consensus Statement on Inpatient Glycemic Control (2015)  Target Ranges:  Prepandial:   less than 140 mg/dL      Peak postprandial:   less than 180 mg/dL (1-2 hours)      Critically ill patients:  140 - 180 mg/dL    Latest Reference Range & Units 07/27/21 12:00 07/27/21 14:26 07/27/21 16:57 07/27/21 22:38 07/28/21 07:24  Glucose-Capillary 70 - 99 mg/dL 427 (H)  15 units Novolog '@1314'$  425 (H)    12 units Levemir '@1505'$  415 (H)  15 units Novolog '@1705'$   462 (H)  18 units Novolog '@0022'$   12 units Levemir '@0023'$  291 (H)  (H): Data is abnormally high   Admit with: COPD/ SOB  History: DM2, COPD, CKD  Home DM Meds: Lantus 16 units QHS       Metformin 1000 mg daily  Current Orders: Levemir 12 units BID      Novolog Moderate Correction Scale/ SSI (0-15 units) TID AC + HS     MD- Note patient getting Solumedrol 40 mg BID  CBG 291 this AM (with 2 doses Levemir on board)  Please consider:  1. Increase Levemir further to 16 units BID (0.4 units/kg)  2. Start Novolog Meal Coverage: Novolog 4 units TID with meals HOLD if pt eats <50% meals     --Will follow patient during hospitalization--  Wyn Quaker RN, MSN, [facility] Diabetes Coordinator Inpatient Glycemic Control Team Team Pager: [phone] (8a-5p)

## 2021-07-28 NOTE — Progress Notes (Signed)
  Transition of Care [facility]) Screening Note   Patient Details  Name: Matilde Pottenger Date of Birth: January 21, 1960   [facility] CM/SW Contact:    Iona Beard, [place] Phone Number: 07/28/2021, 11:11 AM    Transition of Care Department [facility]) has reviewed patient and no TOC needs have been identified at this time. We will continue to monitor patient advancement through interdisciplinary progression rounds. If new patient transition needs arise, please place a TOC consult.

## 2021-07-28 NOTE — Progress Notes (Signed)
This nurse was called into pt room with c/o SOB. O2 sat was 98% on 5L. As per MD Dyann Kief, pt should be titrated down to 3L of O2 . Pt given instructions on how to use flutter valve. SOB has resolved. Will continue to monitor pt.

## 2021-07-28 NOTE — Assessment & Plan Note (Signed)
-   In the setting of shortness of breath, albuterol use and dehydration. -Improved with fluid resuscitation -No red flags or any other complaints or abnormalities. -Continue to maintain adequate hydration.

## 2021-07-28 NOTE — Progress Notes (Addendum)
Progress Note   Patient: Shannon Russell [id number]:[id number] DOB: [birth year] DOA: 07/27/2021     1 DOS: the patient was seen and examined on 07/28/2021   Brief hospital admission course: Shannon Russell is a 61 y.o. female with medical history significant of tobacco abuse, type 2 diabetes mellitus with nephropathy, COPD/asthma, chronic respiratory failure with hypoxia (using oxygen supplementation mainly at bedtime and on daily basis), gastroesophageal reflux disease, depression, insomnia and peripheral arterial disease; who presented to the hospital complaining of intermittent productive coughing spells and worsening shortness of breath.  Patient reports symptoms have been present for the last 4 days or so and worsening.  She has been in need of using oxygen supplementation during the days to assist with her symptoms.  Noticed worsening wheezing and noted improvement with her home bronchodilator management.   Patient reports no chest pain, no nausea, no vomiting, no dysuria, no focal weakness, no hemoptysis, no hematemesis, no hematuria, no melena, no hematochezia, no sick contacts or any other complaints.   In the ED work-up demonstrated acute on chronic bronchitic changes on chest x-ray and physical examination with diffuse expiratory wheezes/rhonchi and the need of 3 L nasal cannula supplementation to maintain O2 sat above 90%.     On room air oxygen saturation in the mid 80s.   TRH has been contacted to place patient in the hospital for further evaluation and management.    Assessment and Plan: * COPD with acute exacerbation ([place]) - In the setting of bronchitis/bronchiectasis and ongoing tobacco abuse. -Still demonstrating difficulty speaking in full sentences, tachypnea/short winded sensation with activity and using 2-3 units, supplementation continuously to maintain O2 sat above 90%. -Continue IV steroids, mucolytic's, bronchodilator management, nebulizer, the use of flutter valve and supportive  care. -Wean off oxygen supplementation as tolerated (patient normally uses 2 L just at bedtime). -Smoking cessation counseling provided.   Lactic acidosis - In the setting of shortness of breath, albuterol use and dehydration. -Continue adequate fluid resuscitation, treatment for her COPD and follow lactic acid level.  Hypothyroidism -Continue Synthroid. -Recent outpatient TSH evaluation within normal limits. -Recommended repeat thyroid panel in 3-4 months.  Obesity, Class I, BMI 30-34.9 -Body mass index is 31.18 kg/m. -Low calorie diet, portion control and lifestyle modifications discussed with patient.  Acute renal failure superimposed on stage 2 chronic kidney disease ([place]) -In the setting of dehydration/prerenal azotemia -Patient reporting no dysuria or hematuria. -Reports good urine output. -Continue to maintain adequate hydration -Continue minimizing the use of nephrotoxic agents, avoiding hypotension and the use of contrast. -Renal function trending down appropriately. -Continue to follow creatinine trend.   Hyperlipidemia -Continue statins.  GERD (gastroesophageal reflux disease) -Continue PPI.  Type 2 diabetes mellitus with diabetic peripheral angiopathy without gangrene ([place]) -Modified carbohydrate diet discussed with patient -Continue holding oral hypoglycemic agents while inpatient -Patient hyperglycemia in the setting of his steroids usage -A1c 9.5 also demonstrating poor control. -Continue sliding scale insulin, Levemir twice a day and NovoLog 3 times a day for meal coverage. -Follow CBGs fluctuation and adjust regimen as required.  Nicotine dependence -Smoking cessation counseling provided -Continue nicotine patch.   Subjective:  Afebrile, no chest pain.  Short winded with minimal activity, requiring 2-3 L nasal cannula supplementation and demonstrating difficulty speaking in full sentences.  Bilateral expiratory wheezing and rhonchi appreciated on  exam.  Physical Exam: Vitals:   07/28/21 0848 07/28/21 0900 07/28/21 1236 07/28/21 1440  BP:    132/70  Pulse:    89  Resp:  18  Temp:    98.5 F (36.9 C)  TempSrc:    Oral  SpO2: 98% 100% 95% 100%  Weight:      Height:       General exam: Alert, awake, oriented x 3; still requiring 2-3 L nasal cannula supplementation to maintain O2 sats above 90%; feeling short winded with minimal activity and having difficulty speaking in full sentences.  Productive coughing spells appreciated on exam. Respiratory system: Fair air movement, diffuse expiratory wheezing and bilateral rhonchi appreciated on exam.  Positive tachypnea seen.  No using accessory muscle. Cardiovascular system: Rate controlled, no rubs, no gallops, no JVD. Gastrointestinal system: Abdomen is obese, nondistended, soft and nontender. No organomegaly or masses felt. Normal bowel sounds heard. Central nervous system: Alert and oriented. No focal neurological deficits. Extremities: No cyanosis or clubbing.  Edema appreciated on exam. Skin: No rashes, no petechiae. Psychiatry: Judgement and insight appear normal. Mood & affect appropriate.   Data Reviewed: CBGs in the 280-400 range. Comprehensive metabolic panel: Sodium 960, potassium 4.1, chloride 109, BUN 31, creatinine 1.45 and normal LFTs. C: Demonstrating WBCs 14.6, hemoglobin 11.2, platelets count 243 K  Family Communication: Husband at bedside.  Disposition: Status is: Inpatient Remains inpatient appropriate because: Still with acute on chronic respiratory failure with hypoxia due to COPD exacerbation.  Requiring nebulizer management and IV steroids.   Planned Discharge Destination: Home  Author: Barton Dubois, MD 07/28/2021 2:59 PM  For on call review [email or web address].

## 2021-07-29 DIAGNOSIS — J441 Chronic obstructive pulmonary disease with (acute) exacerbation: Secondary | ICD-10-CM | POA: Diagnosis not present

## 2021-07-29 DIAGNOSIS — R7989 Other specified abnormal findings of blood chemistry: Secondary | ICD-10-CM | POA: Diagnosis not present

## 2021-07-29 DIAGNOSIS — K219 Gastro-esophageal reflux disease without esophagitis: Secondary | ICD-10-CM | POA: Diagnosis not present

## 2021-07-29 DIAGNOSIS — N179 Acute kidney failure, unspecified: Secondary | ICD-10-CM | POA: Diagnosis not present

## 2021-07-29 LAB — BASIC METABOLIC PANEL
Anion gap: 8 (ref 5–15)
BUN: 30 mg/dL — ABNORMAL HIGH (ref 6–20)
CO2: 23 mmol/L (ref 22–32)
Calcium: 9.1 mg/dL (ref 8.9–10.3)
Chloride: 108 mmol/L (ref 98–111)
Creatinine, Ser: 1.33 mg/dL — ABNORMAL HIGH (ref 0.44–1.00)
GFR, Estimated: 46 mL/min — ABNORMAL LOW (ref >60)
Glucose, Bld: 335 mg/dL — ABNORMAL HIGH (ref 70–99)
Potassium: 4.5 mmol/L (ref 3.5–5.1)
Sodium: 139 mmol/L (ref 135–145)

## 2021-07-29 LAB — GLUCOSE, CAPILLARY
Glucose-Capillary: 345 mg/dL — ABNORMAL HIGH (ref 70–99)
Glucose-Capillary: 306 mg/dL — ABNORMAL HIGH (ref 70–99)
Glucose-Capillary: 231 mg/dL — ABNORMAL HIGH (ref 70–99)
Glucose-Capillary: 386 mg/dL — ABNORMAL HIGH (ref 70–99)

## 2021-07-29 LAB — LACTIC ACID, PLASMA: Lactic Acid, Venous: 3.4 mmol/L (ref 0.5–1.9)

## 2021-07-29 MED ORDER — INSULIN DETEMIR 100 UNIT/ML ~~LOC~~ SOLN
20.0000 [IU] | Freq: Two times a day (BID) | SUBCUTANEOUS | Status: DC
Start: 2021-07-29 — End: 2021-07-30
  Administered 2021-07-29 – 2021-07-30 (×2): 20 [IU] via SUBCUTANEOUS
  Filled 2021-07-29: qty 1
  Filled 2021-07-29 (×4): qty 0.2

## 2021-07-29 NOTE — Progress Notes (Signed)
Patient slept majority for the night. She ambulated to bedside commode. No complaints this shift.

## 2021-07-29 NOTE — Progress Notes (Signed)
Progress Note   Patient: Shannon Russell ZTI:[id number] DOB: 1960-08-19 DOA: 07/27/2021     2 DOS: the patient was seen and examined on 07/29/2021   Brief hospital admission course: Shannon Russell is a 61 y.o. female with medical history significant of tobacco abuse, type 2 diabetes mellitus with nephropathy, COPD/asthma, chronic respiratory failure with hypoxia (using oxygen supplementation mainly at bedtime and on daily basis), gastroesophageal reflux disease, depression, insomnia and peripheral arterial disease; who presented to the hospital complaining of intermittent productive coughing spells and worsening shortness of breath.  Patient reports symptoms have been present for the last 4 days or so and worsening.  She has been in need of using oxygen supplementation during the days to assist with her symptoms.  Noticed worsening wheezing and noted improvement with her home bronchodilator management.   Patient reports no chest pain, no nausea, no vomiting, no dysuria, no focal weakness, no hemoptysis, no hematemesis, no hematuria, no melena, no hematochezia, no sick contacts or any other complaints.   In the ED work-up demonstrated acute on chronic bronchitic changes on chest x-ray and physical examination with diffuse expiratory wheezes/rhonchi and the need of 3 L nasal cannula supplementation to maintain O2 sat above 90%.     On room air oxygen saturation in the mid 80s.   TRH has been contacted to place patient in the hospital for further evaluation and management.    Assessment and Plan: * COPD with acute exacerbation ([place]) - In the setting of bronchitis/bronchiectasis and ongoing tobacco abuse. -Still demonstrating difficulty speaking in full sentences, tachypnea/short winded sensation with activity and using 2-3 units, supplementation continuously to maintain O2 sat above 90%. -Continue IV steroids, mucolytic's, bronchodilator management, nebulizer, the use of flutter valve and supportive  care. -Wean off oxygen supplementation as tolerated (patient normally uses 2 L just at bedtime). -Smoking cessation counseling provided.   Lactic acidosis - In the setting of shortness of breath, albuterol use and dehydration. -Continue adequate fluid resuscitation, treatment for her COPD and follow lactic acid level.  Hypothyroidism -Continue Synthroid. -Recent outpatient TSH evaluation within normal limits. -Recommended repeat thyroid panel in 3-4 months.  Obesity, Class I, BMI 30-34.9 -Body mass index is 31.18 kg/m. -Low calorie diet, portion control and lifestyle modifications discussed with patient.  Acute renal failure superimposed on stage 2 chronic kidney disease ([place]) -In the setting of dehydration/prerenal azotemia -Patient reporting no dysuria or hematuria. -Reports good urine output. -Continue to maintain adequate hydration -Continue minimizing the use of nephrotoxic agents, avoiding hypotension and the use of contrast. -Renal function trending down appropriately. -Continue to follow creatinine trend.   Hyperlipidemia -Continue statins.  GERD (gastroesophageal reflux disease) -Continue PPI.  Type 2 diabetes mellitus with diabetic peripheral angiopathy without gangrene ([place]) -Modified carbohydrate diet discussed with patient -Continue holding oral hypoglycemic agents while inpatient -Patient hyperglycemia in the setting of his steroids usage -A1c 9.5 also demonstrating poor control. -Continue sliding scale insulin, Levemir twice a day and NovoLog 3 times a day for meal coverage. -Follow CBGs fluctuation and adjust regimen as required.  Nicotine dependence -Smoking cessation counseling provided -Continue nicotine patch.   Subjective:  No fever, no chest pain, no nausea, no vomiting.  Still demonstrating short winded sensation with minimal activity, complaining of intermittent productive coughing spells and having difficulty speaking in full sentences.  2 L  nasal cannula supplementation in place.  Physical Exam: Vitals:   07/29/21 0909 07/29/21 1217 07/29/21 1416 07/29/21 1638  BP:   117/63   Pulse:  81   Resp:   16   Temp:   97.9 F (36.6 C)   TempSrc:      SpO2: 92% 95% 98% 98%  Weight:      Height:       General exam: Alert, awake, oriented x 3; reports breathing continues to improve.  No chest pain, no nausea, no vomiting.  Still having difficulty speaking in full sentences and feeling short winded with activity.  Patient reported intermittent productive coughing spells. Respiratory system: Fair air movement bilaterally; no using accessory muscles at time of examination.  Positive rhonchi and bilateral wheezing appreciated.  2 L nasal cannula supplementation in place. Cardiovascular system:RRR.  Rubs or gallops; no JVD.Marland Kitchen Gastrointestinal system: Abdomen is obese, nondistended, soft and nontender. No organomegaly or masses felt. Normal bowel sounds heard. Central nervous system: Alert and oriented. No focal neurological deficits. Extremities: No cyanosis, clubbing or edema. Skin: No petechiae. Psychiatry: Judgement and insight appear normal. Mood & affect appropriate.   Data Reviewed: CBGs in the 280-300 range Basic metabolic panel: Sodium 156, potassium 4.5, chloride 108, bicarb 23, BUN 30, creatinine 1.33 Lactic acid 3.4  Family Communication: Husband at bedside.  Disposition: Status is: Inpatient Remains inpatient appropriate because: Still with acute on chronic respiratory failure with hypoxia due to COPD exacerbation.  Requiring nebulizer management and IV steroids.   Planned Discharge Destination: Home  Author: Barton Dubois, MD 07/29/2021 5:53 PM  For on call review [email or web address].

## 2021-07-30 DIAGNOSIS — K219 Gastro-esophageal reflux disease without esophagitis: Secondary | ICD-10-CM | POA: Diagnosis not present

## 2021-07-30 DIAGNOSIS — R7989 Other specified abnormal findings of blood chemistry: Secondary | ICD-10-CM | POA: Diagnosis not present

## 2021-07-30 DIAGNOSIS — J441 Chronic obstructive pulmonary disease with (acute) exacerbation: Secondary | ICD-10-CM | POA: Diagnosis not present

## 2021-07-30 DIAGNOSIS — N179 Acute kidney failure, unspecified: Secondary | ICD-10-CM | POA: Diagnosis not present

## 2021-07-30 LAB — GLUCOSE, CAPILLARY
Glucose-Capillary: 285 mg/dL — ABNORMAL HIGH (ref 70–99)
Glucose-Capillary: 277 mg/dL — ABNORMAL HIGH (ref 70–99)

## 2021-07-30 MED ORDER — DOXYCYCLINE HYCLATE 100 MG PO TABS: 100.0000 mg | ORAL_TABLET | Freq: Two times a day (BID) | ORAL | 0 refills | Status: AC

## 2021-07-30 MED ORDER — PREDNISONE 20 MG PO TABS: ORAL_TABLET | ORAL | 0 refills | Status: DC

## 2021-07-30 MED ORDER — LISINOPRIL 5 MG PO TABS: 5.0000 mg | ORAL_TABLET | Freq: Every day | ORAL | Status: DC

## 2021-07-30 MED ORDER — DM-GUAIFENESIN ER 30-600 MG PO TB12: 1.0000 | ORAL_TABLET | Freq: Two times a day (BID) | ORAL | 0 refills | Status: AC

## 2021-07-30 MED ORDER — FUROSEMIDE 40 MG PO TABS: 40.0000 mg | ORAL_TABLET | Freq: Every day | ORAL | Status: DC

## 2021-07-30 MED ORDER — NICOTINE 14 MG/24HR TD PT24: 14.0000 mg | MEDICATED_PATCH | Freq: Every day | TRANSDERMAL | 0 refills | Status: DC

## 2021-07-30 MED ORDER — BUDESONIDE 0.5 MG/2ML IN SUSP: 1.0000 mg | Freq: Two times a day (BID) | RESPIRATORY_TRACT | 2 refills | Status: DC

## 2021-07-30 MED ORDER — PANTOPRAZOLE SODIUM 40 MG PO TBEC: 40.0000 mg | DELAYED_RELEASE_TABLET | Freq: Two times a day (BID) | ORAL | 3 refills | Status: DC

## 2021-07-30 NOTE — Discharge Summary (Signed)
Physician Discharge Summary   Patient: Shannon Russell MRN: [id number] DOB: December 21, 1960  Admit date:     07/27/2021  Discharge date: 07/30/21  Discharge Physician: Barton Dubois   PCP: [facility]   Recommendations at discharge:  Repeat basic metabolic panel to follow electrolytes renal function Reassess blood pressure and adjust antihypertensive regimen as needed Make sure patient has follow-up with pulmonologist to further adjust management for her COPD. Continue assisting patient with tobacco cessation and weight loss. Close monitoring to patient's CBG/A1c with further adjustment to hypoglycemic regimen as needed  Discharge Diagnoses: Principal Problem:   COPD exacerbation ([place]) Active Problems:   Nicotine dependence   Type 2 diabetes mellitus with diabetic peripheral angiopathy without gangrene (HCC)   GERD (gastroesophageal reflux disease)   Hyperlipidemia   Acute renal failure superimposed on stage 2 chronic kidney disease (HCC)   Obesity, Class I, BMI 30-34.9   Hypothyroidism   Lactic acidosis   Elevated lactic acid level  Brief hospital admission course: Shannon Russell is a 61 y.o. female with medical history significant of tobacco abuse, type 2 diabetes mellitus with nephropathy, COPD/asthma, chronic respiratory failure with hypoxia (using oxygen supplementation mainly at bedtime and on daily basis), gastroesophageal reflux disease, depression, insomnia and peripheral arterial disease; who presented to the hospital complaining of intermittent productive coughing spells and worsening shortness of breath.  Patient reports symptoms have been present for the last 4 days or so and worsening.  She has been in need of using oxygen supplementation during the days to assist with her symptoms.  Noticed worsening wheezing and noted improvement with her home bronchodilator management.   Patient reports no chest pain, no nausea, no vomiting, no dysuria, no focal weakness, no  hemoptysis, no hematemesis, no hematuria, no melena, no hematochezia, no sick contacts or any other complaints.   In the ED work-up demonstrated acute on chronic bronchitic changes on chest x-ray and physical examination with diffuse expiratory wheezes/rhonchi and the need of 3 L nasal cannula supplementation to maintain O2 sat above 90%.     On room air oxygen saturation in the mid 80s.   TRH has been contacted to place patient in the hospital for further evaluation and management.  Assessment and Plan: * COPD exacerbation ([place]) - In the setting of bronchitis/bronchiectasis and ongoing tobacco abuse. -Feeling significantly improved and no longer requiring oxygen supplementation during daytime.  Patient is speaking in full sentences and feeling ready to go home.   -Very little expiratory wheezing appreciated on exam; improved air movement bilaterally, no using accessory muscles and good saturation on room air appreciated at rest.   -Patient will be discharged on oral prednisone tapering, 3 more days of doxycycline to complete antibiotic therapy and resumption of home as needed and maintenance bronchodilator management. -Patient will benefit of appointment with pulmonologist for repeat PFTs and further adjust therapy for her COPD. -Continue the use of Singulair. -Smoking cessation provided.     Lactic acidosis - In the setting of shortness of breath, albuterol use and dehydration. -Improved with fluid resuscitation -No red flags or any other complaints or abnormalities. -Continue to maintain adequate hydration.  Hypothyroidism -Continue Synthroid. -Recent outpatient TSH evaluation within normal limits. -Recommended repeat thyroid panel in 3-4 months.  Obesity, Class I, BMI 30-34.9 -Body mass index is 31.18 kg/m. -Low calorie diet, portion control and lifestyle modifications discussed with patient.  Acute renal failure superimposed on stage 2 chronic kidney disease ([place]) -In the  setting of dehydration/prerenal azotemia -Patient reporting no  dysuria or hematuria. -Reports good urine output. -Renal function improved and pretty much back to her baseline based on GFR -Advised to maintain adequate hydration -Safe to resume home diuretic and lisinopril on 08/01/2021. -Reassess basic metabolic panel at follow-up visit to check renal function trend/stability.    Hyperlipidemia -Continue statins. -Heart healthy diet encouraged.  GERD (gastroesophageal reflux disease) -Continue PPI. -Dose adjusted to twice a day for better protection and symptom management number (patient reported some reflux symptoms while using steroids).  Type 2 diabetes mellitus with diabetic peripheral angiopathy without gangrene (HCC) -A1c 9.5 demonstrating poor control -Close outpatient follow-up with to further adjust hypoglycemia regimen as needed -Resume the use of oral hypoglycemic agents along with adjusted dose of insulin therapy. -Modified carbohydrate diet has been encouraged.  Nicotine dependence -Smoking cessation counseling provided -Nicotine patch prescribed at discharge.  Consultants: None Procedures performed: See below for x-ray reports Disposition: Home Diet recommendation: Modified carbohydrate, low calorie and heart healthy diet.  DISCHARGE MEDICATION: Allergies as of 07/30/2021       Reactions   Advair Diskus [fluticasone-salmeterol] Hives   Iodine Rash, Hives   Other reaction(s): Skin Rashes, Hives        Medication List     TAKE these medications    albuterol 108 (90 Base) MCG/ACT inhaler Commonly known as: VENTOLIN HFA Inhale 2 puffs into the lungs every 6 (six) hours as needed for wheezing. What changed: Another medication with the same name was removed. Continue taking this medication, and follow the directions you see here.   aspirin EC 81 MG tablet Take 81 mg by mouth daily. Swallow whole.   atorvastatin 80 MG tablet Commonly known as:  LIPITOR Take 80 mg by mouth daily.   baclofen 10 MG tablet Commonly known as: LIORESAL Take 10 mg by mouth every 12 (twelve) hours as needed for muscle spasms.   budesonide 0.5 MG/2ML nebulizer solution Commonly known as: PULMICORT Take 4 mLs (1 mg total) by nebulization 2 (two) times daily. What changed: how much to take   Cholecalciferol 1.25 MG (50000 UT) capsule Vitamin D3 1.25 MG (50000 UT) Oral Capsule QTY: 0 capsule Days: 0 Refills: 0  Written: 02/09/20 Patient Instructions: qaw   clopidogrel 75 MG tablet Commonly known as: PLAVIX Take 75 mg by mouth daily.   dextromethorphan-guaiFENesin 30-600 MG 12hr tablet Commonly known as: MUCINEX DM Take 1 tablet by mouth 2 (two) times daily for 7 days.   doxycycline 100 MG tablet Commonly known as: VIBRA-TABS Take 1 tablet (100 mg total) by mouth every 12 (twelve) hours for 3 days.   fexofenadine 180 MG tablet Commonly known as: ALLEGRA Take 180 mg by mouth every morning.   furosemide 40 MG tablet Commonly known as: LASIX Take 1 tablet (40 mg total) by mouth daily. Start taking on: August 01, 2021 What changed: These instructions start on August 01, 2021. If you are unsure what to do until then, ask your doctor or other care provider.   ipratropium-albuterol 0.5-2.5 (3) MG/3ML Soln Commonly known as: DUONEB Take 3 mLs by nebulization every 6 (six) hours as needed.   Lantus SoloStar 100 UNIT/ML Solostar Pen Generic drug: insulin glargine Inject 16 Units into the skin at bedtime.   levothyroxine 100 MCG tablet Commonly known as: SYNTHROID Take 100 mcg by mouth every morning.   lisinopril 5 MG tablet Commonly known as: ZESTRIL Take 1 tablet (5 mg total) by mouth daily. Start taking on: August 01, 2021 What changed: These instructions start on August 01, 2021. If you are unsure what to do until then, ask your doctor or other care provider.   metFORMIN 1000 MG tablet Commonly known as: GLUCOPHAGE Take 1,000 mg by mouth every  morning.   montelukast 10 MG tablet Commonly known as: SINGULAIR Take 1 tablet by mouth at bedtime.   nicotine 14 mg/24hr patch Commonly known as: NICODERM CQ - dosed in mg/24 hours Place 1 patch (14 mg total) onto the skin daily. Start taking on: July 31, 2021   pantoprazole 40 MG tablet Commonly known as: Protonix Take 1 tablet (40 mg total) by mouth 2 (two) times daily. What changed: when to take this   predniSONE 20 MG tablet Commonly known as: DELTASONE Take 3 tablets by mouth daily x 1 day; then 2 tablet by mouth daily x2 days; then 1 tablet by mouth daily x3 days; then half tablet by mouth daily x3 days and stop prednisone.   pregabalin 75 MG capsule Commonly known as: LYRICA Take 75 mg by mouth 3 (three) times daily.   temazepam 15 MG capsule Commonly known as: RESTORIL Take 15 mg by mouth at bedtime as needed.        Follow-up Information     [facility]. Schedule an appointment as soon as possible for a visit in 10 day(s).   Contact information: [place] [address] [phone]                Discharge Exam: Filed Weights   07/27/21 0539  Weight: 79.8 kg   General exam: Alert, awake, oriented x 3; feeling significantly improved, speaking in full sentences and reporting no chest pain, nausea or vomiting.  Good saturation on room air. Respiratory system: Very little expiratory wheezing appreciated on exam; positive for scattered rhonchi.  Significant improvement in her air movement bilaterally and no using accessory muscles.  Feeling ready to go home.  Good saturation on room air. Cardiovascular system:RRR. No rubs or gallops; no JVD on exam. Gastrointestinal system: Abdomen is obese, nondistended, soft and nontender. No organomegaly or masses felt. Normal bowel sounds heard. Central nervous system: Alert and oriented. No focal neurological deficits. Extremities: No cyanosis, clubbing or edema. Skin: No  petechiae. Psychiatry: Judgement and insight appear normal. Mood & affect appropriate.    Condition at discharge: Stable and improved.  The results of significant diagnostics from this hospitalization (including imaging, microbiology, ancillary and laboratory) are listed below for reference.   Imaging Studies: DG Chest Portable 1 View  Result Date: 07/27/2021 CLINICAL DATA:  Shortness of breath EXAM: PORTABLE CHEST 1 VIEW COMPARISON:  06/07/2020 FINDINGS: Normal heart size and mediastinal contours. Bilateral airway cuffing, airway thickening with seen on a prior chest CT. No acute infiltrate or edema. No effusion or pneumothorax. No acute osseous findings. IMPRESSION: Bilateral bronchitic markings. Electronically Signed   By: Jorje Guild M.D.   On: 07/27/2021 06:13    Microbiology: Results for orders placed or performed during the hospital encounter of 07/27/21  Blood culture (routine x 2)     Status: None (Preliminary result)   Collection Time: 07/27/21  9:08 AM   Specimen: BLOOD  Result Value Ref Range Status   Specimen Description BLOOD LEFT ANTECUBITAL  Final   Special Requests   Final    BOTTLES DRAWN AEROBIC AND ANAEROBIC LEFT ANTECUBITAL   Culture   Final    NO GROWTH 3 DAYS Performed at [facility], [address] [place], [address] [address]    Report Status PENDING  Incomplete  Blood culture (routine x 2)     Status: None (Preliminary result)   Collection Time: 07/27/21  9:08 AM   Specimen: BLOOD  Result Value Ref Range Status   Specimen Description BLOOD BLOOD RIGHT FOREARM  Final   Special Requests   Final    BOTTLES DRAWN AEROBIC AND ANAEROBIC Blood Culture adequate volume   Culture   Final    NO GROWTH 3 DAYS Performed at [facility], [address] [place], [place] [address]    Report Status PENDING  Incomplete  SARS Coronavirus 2 by RT PCR (hospital order, performed in [place] hospital lab) *cepheid single result test* Anterior Nasal Swab     Status:  None   Collection Time: 07/27/21 11:36 AM   Specimen: Anterior Nasal Swab  Result Value Ref Range Status   SARS Coronavirus 2 by RT PCR NEGATIVE NEGATIVE Final    Comment: (NOTE) SARS-CoV-2 target nucleic acids are NOT DETECTED.  The SARS-CoV-2 RNA is generally detectable in upper and lower respiratory specimens during the acute phase of infection. The lowest concentration of SARS-CoV-2 viral copies this assay can detect is 250 copies / mL. A negative result does not preclude SARS-CoV-2 infection and should not be used as the sole basis for treatment or other patient management decisions.  A negative result may occur with improper specimen collection / handling, submission of specimen other than nasopharyngeal swab, presence of viral mutation(s) within the areas targeted by this assay, and inadequate number of viral copies (<250 copies / mL). A negative result must be combined with clinical observations, patient history, and epidemiological information.  Fact Sheet for Patients:   https://www.patel.info/  Fact Sheet for Healthcare Providers: https://hall.com/  This test is not yet approved or  cleared by the [place] FDA and has been authorized for detection and/or diagnosis of SARS-CoV-2 by FDA under an Emergency Use Authorization (EUA).  This EUA will remain in effect (meaning this test can be used) for the duration of the COVID-19 declaration under Section 564(b)(1) of the Act, 21 U.S.C. section 360bbb-3(b)(1), unless the authorization is terminated or revoked sooner.  Performed at [facility], [address] [place], [place] [address]     Labs: CBC: Recent Labs  Lab 07/27/21 [phone] 07/28/21 0358  WBC 13.0* 14.6*  NEUTROABS 8.3*  --   HGB 13.0 11.2*  HCT 38.7 33.9*  MCV 92.6 94.4  PLT 284 867   Basic Metabolic Panel: Recent Labs  Lab 07/27/21 0623 07/27/21 2320 07/28/21 0358 07/29/21 0409  NA 136 136 139 139   K 4.1 4.8 4.1 4.5  CL 102 105 109 108  CO2 23 22 21* 23  GLUCOSE 279* 432* 288* 335*  BUN 29* 33* 31* 30*  CREATININE 1.87* 1.65* 1.45* 1.33*  CALCIUM 9.2 8.7* 8.9 9.1  MG 1.8  --   --   --   PHOS 5.0*  --   --   --    Liver Function Tests: Recent Labs  Lab 07/28/21 0358  AST 14*  ALT 22  ALKPHOS 85  BILITOT 0.3  PROT 6.5  ALBUMIN 3.1*   CBG: Recent Labs  Lab 07/29/21 1110 07/29/21 1612 07/29/21 2051 07/30/21 0713 07/30/21 1140  GLUCAP 306* 231* 386* 285* 277*    Discharge time spent: greater than 30 minutes.  Signed: Barton Dubois, MD [facility] 07/30/2021

## 2021-07-30 NOTE — Progress Notes (Signed)
0847 Patient needed to be cleaned up , unavailable for nebulizer. Treatment to be administered at a later time.

## 2021-08-01 LAB — CULTURE, BLOOD (ROUTINE X 2)
Culture: NO GROWTH
Culture: NO GROWTH
Special Requests: ADEQUATE

## 2021-08-18 ENCOUNTER — Other Ambulatory Visit: Payer: Self-pay | Admitting: Internal Medicine

## 2021-10-08 IMAGING — CT CT HEAD W/O CM
4 series · 17 of 47 positions shown, 19 images · non-contrast
Comparison: None.

CLINICAL DATA: Syncope

EXAM:
CT HEAD WITHOUT CONTRAST
TECHNIQUE: Contiguous axial images were obtained from the base of the skull
through the vertex without intravenous contrast.

[Series 2: head wo · axial · 0.39mm/px · z∈[+445,+555]mm · 7 of 30 slices shown, 9 images]
[im 4/30  brain]
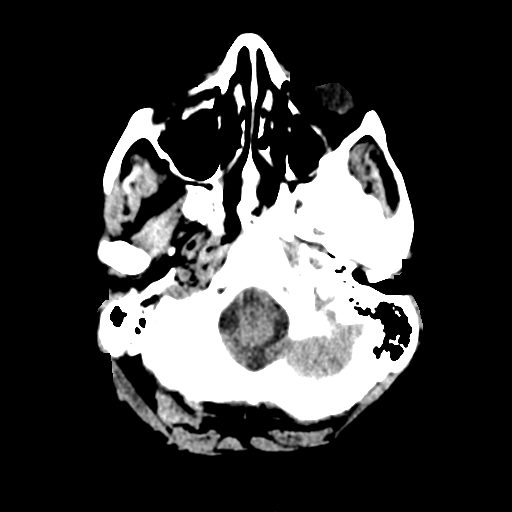
[im 4/30  bone]
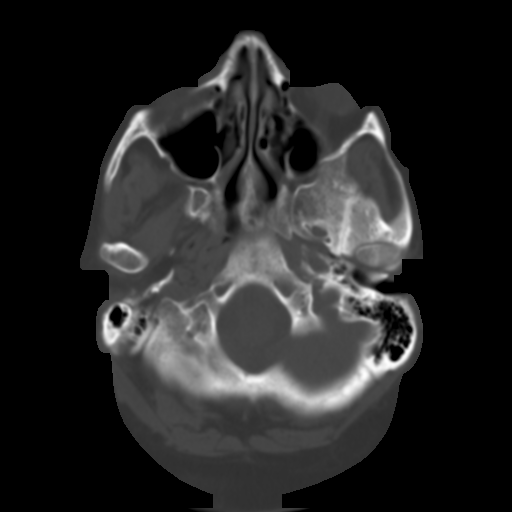
[im 8/30  brain]
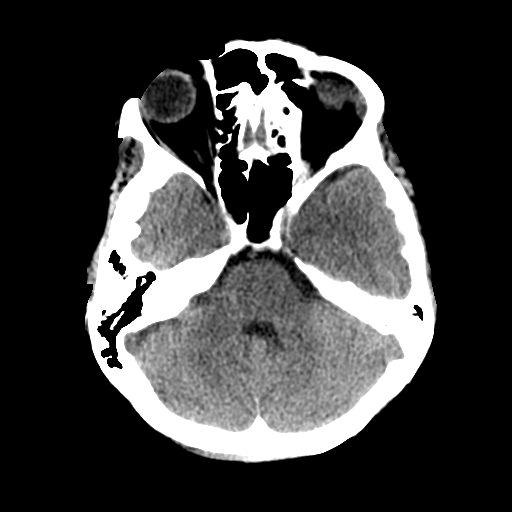
[im 11/30  brain]
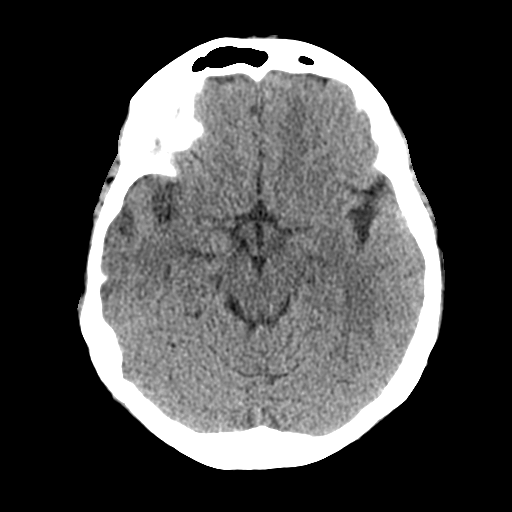
[im 15/30  brain]
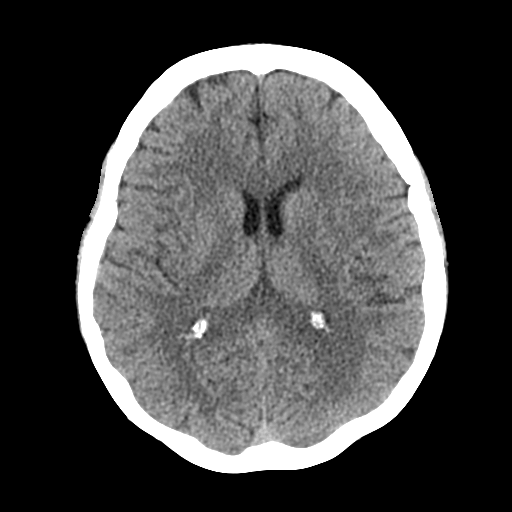
[im 19/30  brain]
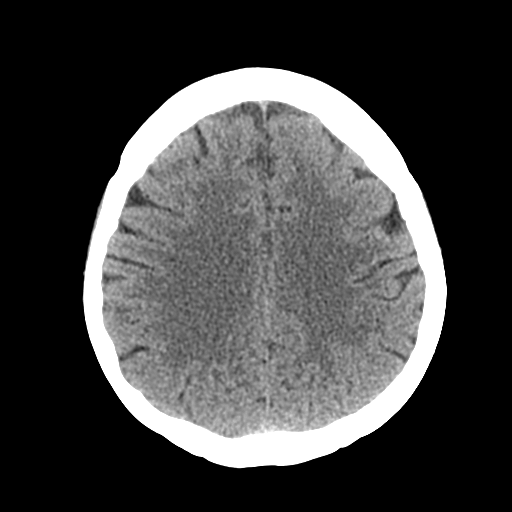
[im 19/30  bone]
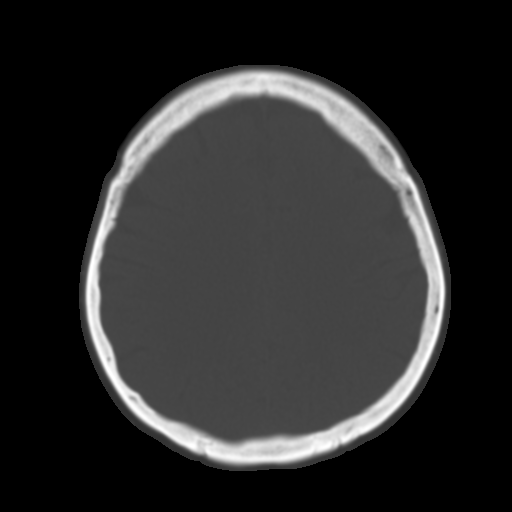
[im 22/30  brain]
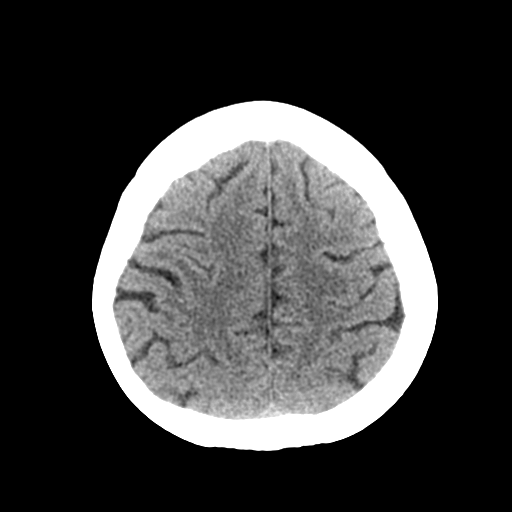
[im 26/30  brain]
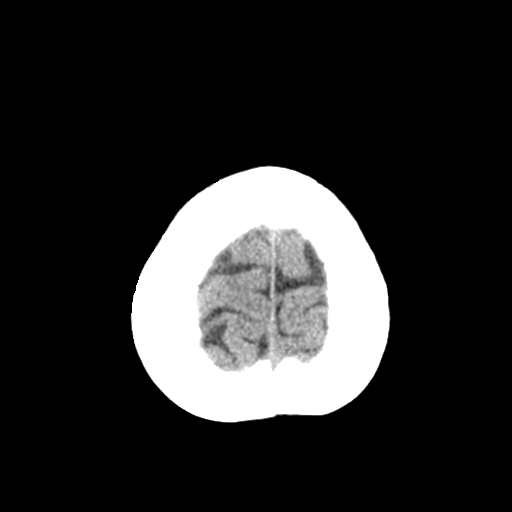

[Series 3: head bone · axial · 0.39mm/px · z∈[+444,+496]mm · 4 of 75 slices shown]
[im 8/75  bone]
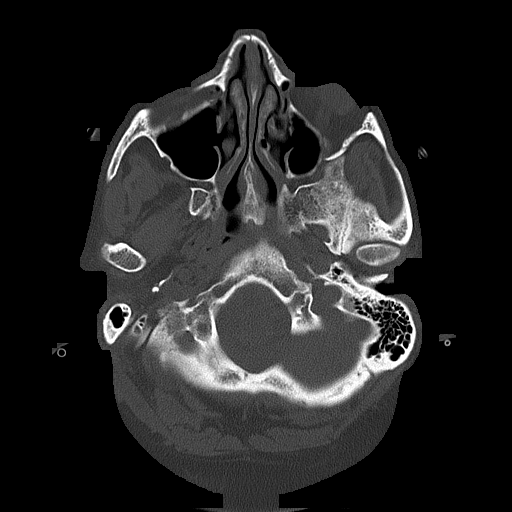
[im 15/75  bone]
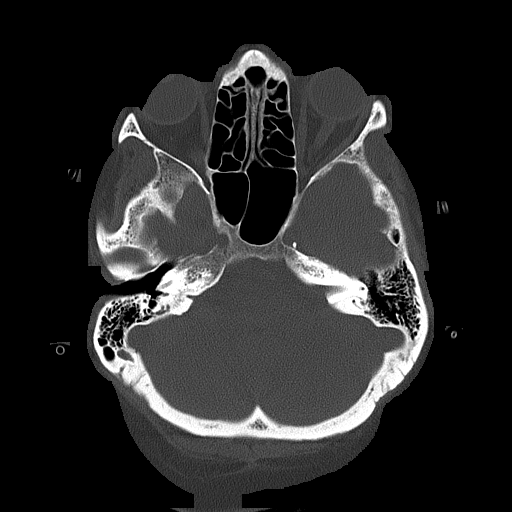
[im 23/75  bone]
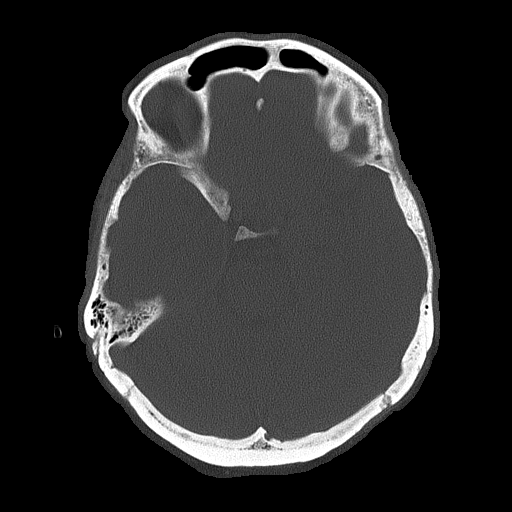
[im 34/75  bone]
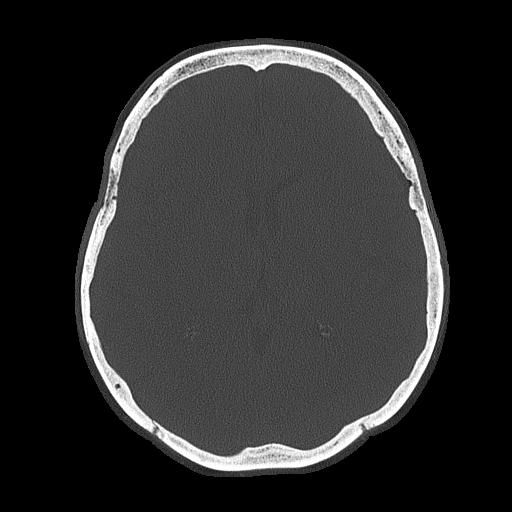

[Series 4: coronal soft tissue · coronal · 0.29mm/px · 3 of 63 slices shown]
[im 21/63  brain]
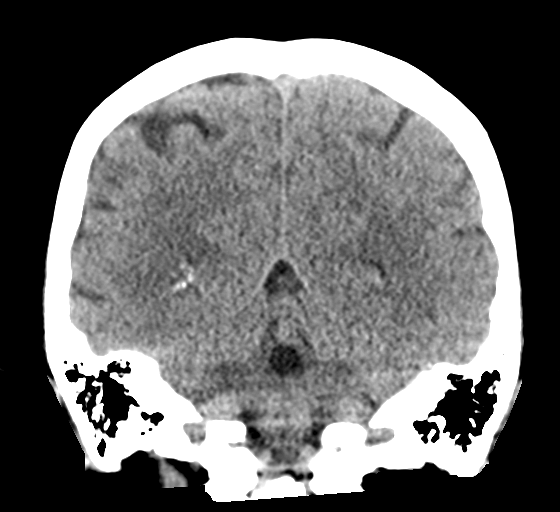
[im 28/63  brain]
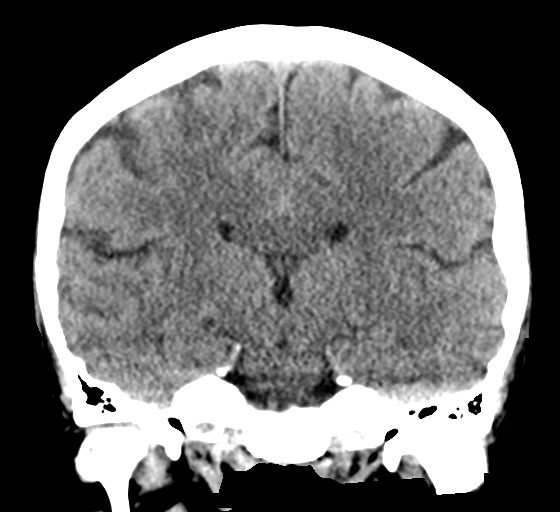
[im 35/63  brain]
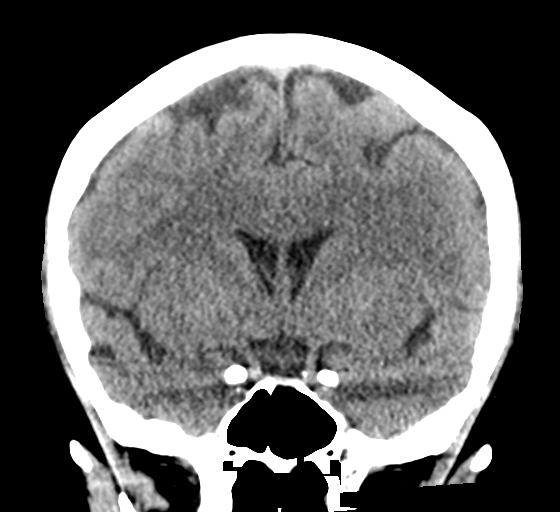

[Series 5: sagittal soft tissue · sagittal · 0.30mm/px · 3 of 58 slices shown]
[im 20/58  brain]
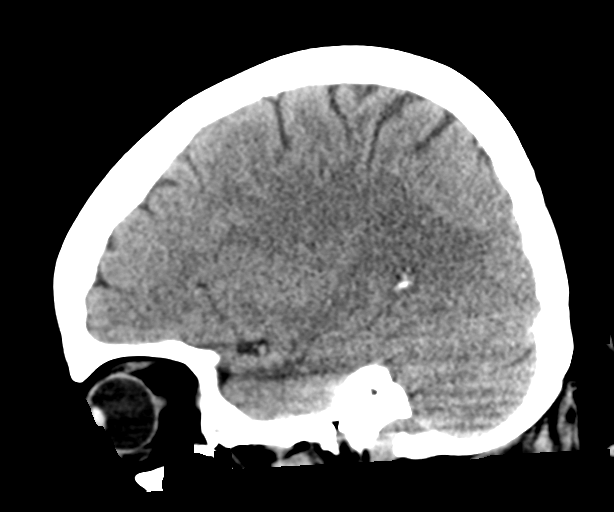
[im 29/58  brain]
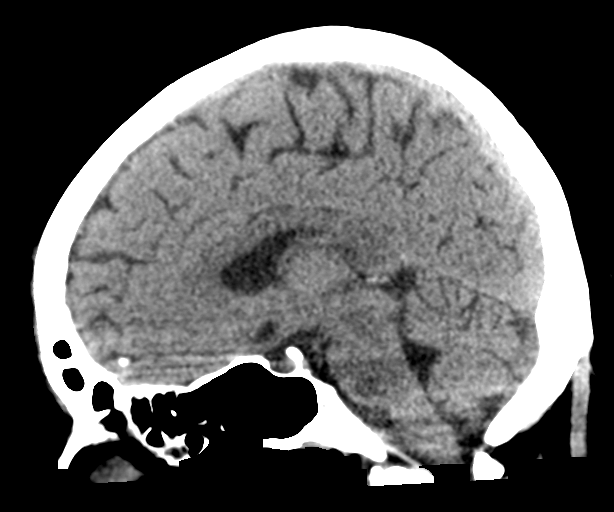
[im 39/58  brain]
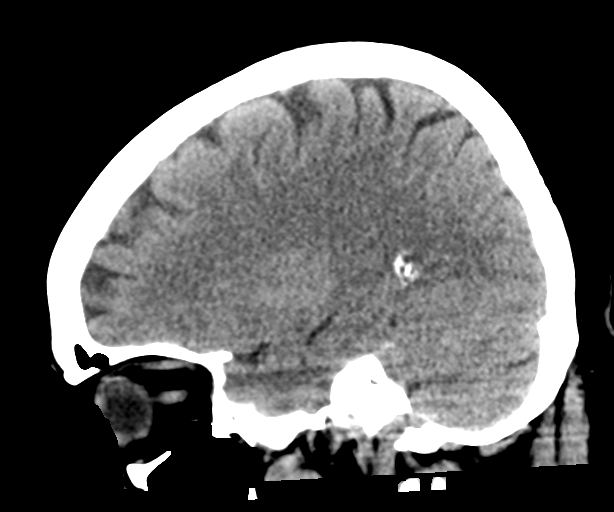

[17 of 47 positions shown; findings below may reference images not displayed]

FINDINGS: Brain: No acute intracranial abnormality. Specifically, no
hemorrhage, hydrocephalus, mass lesion, acute infarction, or
significant intracranial injury.

Vascular: No hyperdense vessel or unexpected calcification.

Skull: No acute calvarial abnormality.

Sinuses/Orbits: No acute findings

Other: None
IMPRESSION: No acute intracranial abnormality.

## 2021-11-06 ENCOUNTER — Encounter (INDEPENDENT_AMBULATORY_CARE_PROVIDER_SITE_OTHER): Payer: Self-pay

## 2022-01-05 ENCOUNTER — Ambulatory Visit
Admission: RE | Admit: 2022-01-05 | Discharge: 2022-01-05 | Disposition: A | Discharge status: Home / Self Care | Payer: Medicaid Other | Source: Ambulatory Visit | Attending: Nurse Practitioner | Admitting: Nurse Practitioner

## 2022-01-05 ENCOUNTER — Ambulatory Visit
Admission: RE | Admit: 2022-01-05 | Discharge: 2022-01-05 | Disposition: A | Discharge status: Home / Self Care | Payer: Medicaid Other | Attending: Nurse Practitioner | Admitting: Nurse Practitioner

## 2022-01-05 ENCOUNTER — Other Ambulatory Visit: Payer: Self-pay | Admitting: Nurse Practitioner

## 2022-01-05 DIAGNOSIS — R0600 Dyspnea, unspecified: Secondary | ICD-10-CM | POA: Insufficient documentation

## 2022-01-17 ENCOUNTER — Other Ambulatory Visit (INDEPENDENT_AMBULATORY_CARE_PROVIDER_SITE_OTHER): Payer: Self-pay | Admitting: Vascular Surgery

## 2022-01-17 DIAGNOSIS — I739 Peripheral vascular disease, unspecified: Secondary | ICD-10-CM

## 2022-01-19 NOTE — H&P (View-Only) (Signed)
MRN : [id number]  Shannon Russell is a 62 y.o. (1960/08/16) female who presents with chief complaint of check circulation.  History of Present Illness:   The patient returns to the office for followup and review status post CT angiogram. The patient notes significant pain in her lower extremity symptoms. She continues to note a short claudication distance with increasing rest pain symptoms. No new ulcers or wounds have occurred since the last visit.   There have been no significant changes to the patient's overall health care.   The patient denies amaurosis fugax or recent TIA symptoms. There are no recent neurological changes noted. The patient denies history of DVT, PE or superficial thrombophlebitis. The patient denies recent episodes of angina or shortness of breath.    CT [facility] done at [facility] and images are not available.  Report is faxed to my office for review and it shows the iliac artery stents are patent and the SFA and popliteal arteries are patent there is diffuse tibial disease bilaterally.  ABI's Rt=0.57 and Lt=0.68  No outpatient medications have been marked as taking for the 01/22/22 encounter (Appointment) with Delana Meyer, Dolores Lory, MD.    Past Medical History:  Diagnosis Date   Arthritis    Asthma    Cancer ([place])    COPD (chronic obstructive pulmonary disease) ([place])    Depression    Diabetes mellitus without complication ([place])    H/O blood clots    PAD (peripheral artery disease) ([place])    Thyroid disease     Past Surgical History:  Procedure Laterality Date   Bypass right leg  2015   CHOLECYSTECTOMY  1981   Mesh implant right leg  2015   THYROIDECTOMY  2013   TUBAL LIGATION  1993    Social History Social History   Tobacco Use   Smoking status: Every Day    Packs/day: 0.25    Years: 45.00    Total pack years: 11.25    Types: Cigarettes   Smokeless tobacco: Never   Tobacco comments:    6 ciggs a day.  Vaping Use   Vaping Use: Never  used  Substance Use Topics   Alcohol use: Not Currently   Drug use: Not Currently    Family History Family History  Problem Relation Age of Onset   Heart failure Mother    Depression Mother    Peripheral Artery Disease Sister    Cancer Sister    Clotting disorder Sister    Depression Sister    Thyroid disease Sister     Allergies  Allergen Reactions   Advair Diskus [Fluticasone-Salmeterol] Hives   Iodine Rash and Hives    Other reaction(s): Skin Rashes, Hives     REVIEW OF SYSTEMS (Negative unless checked)  Constitutional: []$ Weight loss  []$ Fever  []$ Chills Cardiac: []$ Chest pain   []$ Chest pressure   []$ Palpitations   []$ Shortness of breath when laying flat   []$ Shortness of breath with exertion. Vascular:  [x]$ Pain in legs with walking   []$ Pain in legs at rest  []$ History of DVT   []$ Phlebitis   []$ Swelling in legs   []$ Varicose veins   []$ Non-healing ulcers Pulmonary:   []$ Uses home oxygen   []$ Productive cough   []$ Hemoptysis   []$ Wheeze  []$ COPD   []$ Asthma Neurologic:  []$ Dizziness   []$ Seizures   []$ History of stroke   []$ History of TIA  []$ Aphasia   []$ Vissual changes   []$ Weakness or numbness in arm   []$ Weakness or numbness in leg Musculoskeletal:   []$   Joint swelling   []$ Joint pain   []$ Low back pain Hematologic:  []$ Easy bruising  []$ Easy bleeding   []$ Hypercoagulable state   []$ Anemic Gastrointestinal:  []$ Diarrhea   []$ Vomiting  []$ Gastroesophageal reflux/heartburn   []$ Difficulty swallowing. Genitourinary:  []$ Chronic kidney disease   []$ Difficult urination  []$ Frequent urination   []$ Blood in urine Skin:  []$ Rashes   []$ Ulcers  Psychological:  []$ History of anxiety   []$  History of major depression.  Physical Examination  There were no vitals filed for this visit. There is no height or weight on file to calculate BMI. Gen: WD/WN, NAD Head: [place]/AT, No temporalis wasting.  Ear/Nose/Throat: Hearing grossly intact, nares w/o erythema or drainage Eyes: PER, EOMI, sclera nonicteric.  Neck: Supple, no  masses.  No bruit or JVD.  Pulmonary:  Good air movement, no audible wheezing, no use of accessory muscles.  Cardiac: RRR, normal S1, S2, no Murmurs. Vascular:  mild trophic changes, no open wounds Vessel Right Left  Radial Palpable Palpable  PT Not Palpable Not Palpable  DP Not Palpable Not Palpable  Gastrointestinal: soft, non-distended. No guarding/no peritoneal signs.  Musculoskeletal: M/S 5/5 throughout.  No visible deformity.  Neurologic: CN 2-12 intact. Pain and light touch intact in extremities.  Symmetrical.  Speech is fluent. Motor exam as listed above. Psychiatric: Judgment intact, Mood & affect appropriate for pt's clinical situation. Dermatologic: No rashes or ulcers noted.  No changes consistent with cellulitis.   CBC Lab Results  Component Value Date   WBC 14.6 (H) 07/28/2021   HGB 11.2 (L) 07/28/2021   HCT 33.9 (L) 07/28/2021   MCV 94.4 07/28/2021   PLT 243 07/28/2021    BMET    Component Value Date/Time   NA 139 07/29/2021 0409   K 4.5 07/29/2021 0409   CL 108 07/29/2021 0409   CO2 23 07/29/2021 0409   GLUCOSE 335 (H) 07/29/2021 0409   BUN 30 (H) 07/29/2021 0409   CREATININE 1.33 (H) 07/29/2021 0409   CALCIUM 9.1 07/29/2021 0409   GFRNONAA 46 (L) 07/29/2021 0409   GFRAA >60 01/20/2019 0529   CrCl cannot be calculated (Patient's most recent lab result is older than the maximum 21 days allowed.).  COAG No results found for: "INR", "PROTIME"  Radiology DG Chest 2 View  Result Date: 01/05/2022 CLINICAL DATA:  Dyspnea EXAM: CHEST - 2 VIEW COMPARISON:  07/27/2021 FINDINGS: The heart size and mediastinal contours are within normal limits. Both lungs are clear. The visualized skeletal structures are unremarkable. IMPRESSION: No active cardiopulmonary disease. Electronically Signed   By: Davina Poke D.O.   On: 01/05/2022 12:28     Assessment/Plan 1. PAD (peripheral artery disease) (HCC) Recommend:  The patient has evidence of severe atherosclerotic  changes of both lower extremities with rest pain that is associated with preulcerative changes and impending tissue loss of the right foot.  This represents a limb threatening ischemia and places the patient at the risk for right limb loss.  Patient should undergo angiography of the right lower extremity with the hope for intervention for limb salvage.  The risks and benefits as well as the alternative therapies was discussed in detail with the patient.  All questions were answered.  Patient agrees to proceed with right lower extremity angiography.  The patient will follow up with me in the office after the procedure.      2. Type 2 diabetes mellitus with diabetic peripheral angiopathy without gangrene, without long-term current use of insulin (HCC) Continue hypoglycemic medications as already ordered, these medications  have been reviewed and there are no changes at this time.  Hgb A1C to be monitored as already arranged by primary service  3. Chronic obstructive pulmonary disease, unspecified COPD type ([place]) Continue pulmonary medications and aerosols as already ordered, these medications have been reviewed and there are no changes at this time.   4. Gastroesophageal reflux disease without esophagitis Continue PPI as already ordered, this medication has been reviewed and there are no changes at this time.  Avoidence of caffeine and alcohol  Moderate elevation of the head of the bed   5. Mixed hyperlipidemia Continue statin as ordered and reviewed, no changes at this time  6. Peripheral arterial disease with history of revascularization ([place]) See #1 - VAS [place] ABI WITH/WO TBI   [name], MD  01/19/2022 4:49 PM

## 2022-01-19 NOTE — Progress Notes (Signed)
  MRN : [id number]  Shannon Russell is a 62 y.o. (04/03/1960) female who presents with chief complaint of check circulation.  History of Present Illness:   The patient returns to the office for followup and review status post CT angiogram. The patient notes significant pain in her lower extremity symptoms. She continues to note a short claudication distance with increasing rest pain symptoms. No new ulcers or wounds have occurred since the last visit.   There have been no significant changes to the patient's overall health care.   The patient denies amaurosis fugax or recent TIA symptoms. There are no recent neurological changes noted. The patient denies history of DVT, PE or superficial thrombophlebitis. The patient denies recent episodes of angina or shortness of breath.    CT scan was done at [facility] and images are not available.  Report is faxed to my office for review and it shows the iliac artery stents are patent and the SFA and popliteal arteries are patent there is diffuse tibial disease bilaterally.  ABI's Rt=0.57 and Lt=0.68  No outpatient medications have been marked as taking for the 01/22/22 encounter (Appointment) with Saahil Herbster G, MD.    Past Medical History:  Diagnosis Date   Arthritis    Asthma    Cancer (HCC)    COPD (chronic obstructive pulmonary disease) (HCC)    Depression    Diabetes mellitus without complication (HCC)    H/O blood clots    PAD (peripheral artery disease) (HCC)    Thyroid disease     Past Surgical History:  Procedure Laterality Date   Bypass right leg  2015   CHOLECYSTECTOMY  1981   Mesh implant right leg  2015   THYROIDECTOMY  2013   TUBAL LIGATION  1993    Social History Social History   Tobacco Use   Smoking status: Every Day    Packs/day: 0.25    Years: 45.00    Total pack years: 11.25    Types: Cigarettes   Smokeless tobacco: Never   Tobacco comments:    6 ciggs a day.  Vaping Use   Vaping Use: Never  used  Substance Use Topics   Alcohol use: Not Currently   Drug use: Not Currently    Family History Family History  Problem Relation Age of Onset   Heart failure Mother    Depression Mother    Peripheral Artery Disease Sister    Cancer Sister    Clotting disorder Sister    Depression Sister    Thyroid disease Sister     Allergies  Allergen Reactions   Advair Diskus [Fluticasone-Salmeterol] Hives   Iodine Rash and Hives    Other reaction(s): Skin Rashes, Hives     REVIEW OF SYSTEMS (Negative unless checked)  Constitutional: []Weight loss  []Fever  []Chills Cardiac: []Chest pain   []Chest pressure   []Palpitations   []Shortness of breath when laying flat   []Shortness of breath with exertion. Vascular:  [x]Pain in legs with walking   []Pain in legs at rest  []History of DVT   []Phlebitis   []Swelling in legs   []Varicose veins   []Non-healing ulcers Pulmonary:   []Uses home oxygen   []Productive cough   []Hemoptysis   []Wheeze  []COPD   []Asthma Neurologic:  []Dizziness   []Seizures   []History of stroke   []History of TIA  []Aphasia   []Vissual changes   []Weakness or numbness in arm   []Weakness or numbness in leg Musculoskeletal:   []  Joint swelling   []Joint pain   []Low back pain Hematologic:  []Easy bruising  []Easy bleeding   []Hypercoagulable state   []Anemic Gastrointestinal:  []Diarrhea   []Vomiting  []Gastroesophageal reflux/heartburn   []Difficulty swallowing. Genitourinary:  []Chronic kidney disease   []Difficult urination  []Frequent urination   []Blood in urine Skin:  []Rashes   []Ulcers  Psychological:  []History of anxiety   [] History of major depression.  Physical Examination  There were no vitals filed for this visit. There is no height or weight on file to calculate BMI. Gen: WD/WN, NAD Head: [place]/AT, No temporalis wasting.  Ear/Nose/Throat: Hearing grossly intact, nares w/o erythema or drainage Eyes: PER, EOMI, sclera nonicteric.  Neck: Supple, no  masses.  No bruit or JVD.  Pulmonary:  Good air movement, no audible wheezing, no use of accessory muscles.  Cardiac: RRR, normal S1, S2, no Murmurs. Vascular:  mild trophic changes, no open wounds Vessel Right Left  Radial Palpable Palpable  PT Not Palpable Not Palpable  DP Not Palpable Not Palpable  Gastrointestinal: soft, non-distended. No guarding/no peritoneal signs.  Musculoskeletal: M/S 5/5 throughout.  No visible deformity.  Neurologic: CN 2-12 intact. Pain and light touch intact in extremities.  Symmetrical.  Speech is fluent. Motor exam as listed above. Psychiatric: Judgment intact, Mood & affect appropriate for pt's clinical situation. Dermatologic: No rashes or ulcers noted.  No changes consistent with cellulitis.   CBC Lab Results  Component Value Date   WBC 14.6 (H) 07/28/2021   HGB 11.2 (L) 07/28/2021   HCT 33.9 (L) 07/28/2021   MCV 94.4 07/28/2021   PLT 243 07/28/2021    BMET    Component Value Date/Time   NA 139 07/29/2021 0409   K 4.5 07/29/2021 0409   CL 108 07/29/2021 0409   CO2 23 07/29/2021 0409   GLUCOSE 335 (H) 07/29/2021 0409   BUN 30 (H) 07/29/2021 0409   CREATININE 1.33 (H) 07/29/2021 0409   CALCIUM 9.1 07/29/2021 0409   GFRNONAA 46 (L) 07/29/2021 0409   GFRAA >60 01/20/2019 0529   CrCl cannot be calculated (Patient's most recent lab result is older than the maximum 21 days allowed.).  COAG No results found for: "INR", "PROTIME"  Radiology DG Chest 2 View  Result Date: 01/05/2022 CLINICAL DATA:  Dyspnea EXAM: CHEST - 2 VIEW COMPARISON:  07/27/2021 FINDINGS: The heart size and mediastinal contours are within normal limits. Both lungs are clear. The visualized skeletal structures are unremarkable. IMPRESSION: No active cardiopulmonary disease. Electronically Signed   By: Nicholas  Plundo D.O.   On: 01/05/2022 12:28     Assessment/Plan 1. PAD (peripheral artery disease) (HCC) Recommend:  The patient has evidence of severe atherosclerotic  changes of both lower extremities with rest pain that is associated with preulcerative changes and impending tissue loss of the right foot.  This represents a limb threatening ischemia and places the patient at the risk for right limb loss.  Patient should undergo angiography of the right lower extremity with the hope for intervention for limb salvage.  The risks and benefits as well as the alternative therapies was discussed in detail with the patient.  All questions were answered.  Patient agrees to proceed with right lower extremity angiography.  The patient will follow up with me in the office after the procedure.      2. Type 2 diabetes mellitus with diabetic peripheral angiopathy without gangrene, without long-term current use of insulin (HCC) Continue hypoglycemic medications as already ordered, these medications   have been reviewed and there are no changes at this time.  Hgb A1C to be monitored as already arranged by primary service  3. Chronic obstructive pulmonary disease, unspecified COPD type (HCC) Continue pulmonary medications and aerosols as already ordered, these medications have been reviewed and there are no changes at this time.   4. Gastroesophageal reflux disease without esophagitis Continue PPI as already ordered, this medication has been reviewed and there are no changes at this time.  Avoidence of caffeine and alcohol  Moderate elevation of the head of the bed   5. Mixed hyperlipidemia Continue statin as ordered and reviewed, no changes at this time  6. Peripheral arterial disease with history of revascularization (HCC) See #1 - VAS US ABI WITH/WO TBI   Selena Swaminathan, MD  01/19/2022 4:49 PM   

## 2022-01-22 ENCOUNTER — Ambulatory Visit (INDEPENDENT_AMBULATORY_CARE_PROVIDER_SITE_OTHER): Payer: Medicaid Other | Admitting: Vascular Surgery

## 2022-01-22 ENCOUNTER — Encounter (INDEPENDENT_AMBULATORY_CARE_PROVIDER_SITE_OTHER): Payer: Self-pay | Admitting: Vascular Surgery

## 2022-01-22 ENCOUNTER — Ambulatory Visit (INDEPENDENT_AMBULATORY_CARE_PROVIDER_SITE_OTHER): Payer: Medicaid Other

## 2022-01-22 VITALS — BP 95/59 | HR 88 | Ht 63.0 in | Wt 176.0 lb

## 2022-01-22 DIAGNOSIS — I739 Peripheral vascular disease, unspecified: Secondary | ICD-10-CM

## 2022-01-22 DIAGNOSIS — J449 Chronic obstructive pulmonary disease, unspecified: Secondary | ICD-10-CM | POA: Diagnosis not present

## 2022-01-22 DIAGNOSIS — E1151 Type 2 diabetes mellitus with diabetic peripheral angiopathy without gangrene: Secondary | ICD-10-CM

## 2022-01-22 DIAGNOSIS — Z9889 Other specified postprocedural states: Secondary | ICD-10-CM | POA: Diagnosis not present

## 2022-01-22 DIAGNOSIS — K219 Gastro-esophageal reflux disease without esophagitis: Secondary | ICD-10-CM

## 2022-01-22 DIAGNOSIS — E782 Mixed hyperlipidemia: Secondary | ICD-10-CM

## 2022-01-22 LAB — VAS US ABI WITH/WO TBI
Left ABI: 0.68
Right ABI: 0.57

## 2022-01-25 ENCOUNTER — Telehealth (INDEPENDENT_AMBULATORY_CARE_PROVIDER_SITE_OTHER): Payer: Self-pay

## 2022-01-25 NOTE — Telephone Encounter (Signed)
Spoke with the patient regarding getting scheduled with Dr. Delana Meyer on 02/20/22 for a right leg angio with intervention. Per the patient's insurance I am in the process of getting a prior authorization as well and will call and confirm details once I have the auth completed.

## 2022-01-26 ENCOUNTER — Encounter (INDEPENDENT_AMBULATORY_CARE_PROVIDER_SITE_OTHER): Payer: Self-pay | Admitting: Vascular Surgery

## 2022-02-09 ENCOUNTER — Telehealth (INDEPENDENT_AMBULATORY_CARE_PROVIDER_SITE_OTHER): Payer: Self-pay

## 2022-02-09 NOTE — Telephone Encounter (Signed)
Spoke with the patient and she is scheduled with Dr. Delana Meyer on 02/20/22 with a 6:45 am [facility]. Scheduled is a right leg angio with intervention. Pre-procedure instructions were discussed and will be mailed.

## 2022-02-15 ENCOUNTER — Ambulatory Visit: Payer: Medicaid Other | Admitting: Cardiovascular Disease

## 2022-02-20 ENCOUNTER — Ambulatory Visit
Admission: RE | Admit: 2022-02-20 | Discharge: 2022-02-20 | Disposition: A | Discharge status: Home / Self Care | Payer: Medicaid Other | Attending: Vascular Surgery | Admitting: Vascular Surgery

## 2022-02-20 ENCOUNTER — Encounter
Admission: RE | Disposition: A | Discharge status: Home / Self Care | Payer: Self-pay | Source: Home / Self Care | Attending: Vascular Surgery

## 2022-02-20 ENCOUNTER — Encounter: Payer: Self-pay | Admitting: Vascular Surgery

## 2022-02-20 ENCOUNTER — Other Ambulatory Visit: Payer: Self-pay

## 2022-02-20 DIAGNOSIS — Z79899 Other long term (current) drug therapy: Secondary | ICD-10-CM | POA: Insufficient documentation

## 2022-02-20 DIAGNOSIS — E1151 Type 2 diabetes mellitus with diabetic peripheral angiopathy without gangrene: Secondary | ICD-10-CM | POA: Diagnosis not present

## 2022-02-20 DIAGNOSIS — I708 Atherosclerosis of other arteries: Secondary | ICD-10-CM | POA: Diagnosis not present

## 2022-02-20 DIAGNOSIS — J449 Chronic obstructive pulmonary disease, unspecified: Secondary | ICD-10-CM | POA: Diagnosis not present

## 2022-02-20 DIAGNOSIS — I70202 Unspecified atherosclerosis of native arteries of extremities, left leg: Secondary | ICD-10-CM

## 2022-02-20 DIAGNOSIS — F17210 Nicotine dependence, cigarettes, uncomplicated: Secondary | ICD-10-CM | POA: Diagnosis not present

## 2022-02-20 DIAGNOSIS — T82856A Stenosis of peripheral vascular stent, initial encounter: Secondary | ICD-10-CM

## 2022-02-20 DIAGNOSIS — I70221 Atherosclerosis of native arteries of extremities with rest pain, right leg: Secondary | ICD-10-CM

## 2022-02-20 DIAGNOSIS — K219 Gastro-esophageal reflux disease without esophagitis: Secondary | ICD-10-CM | POA: Insufficient documentation

## 2022-02-20 DIAGNOSIS — I701 Atherosclerosis of renal artery: Secondary | ICD-10-CM

## 2022-02-20 DIAGNOSIS — I700 Atherosclerosis of aorta: Secondary | ICD-10-CM

## 2022-02-20 DIAGNOSIS — E782 Mixed hyperlipidemia: Secondary | ICD-10-CM | POA: Insufficient documentation

## 2022-02-20 DIAGNOSIS — I739 Peripheral vascular disease, unspecified: Secondary | ICD-10-CM

## 2022-02-20 DIAGNOSIS — I70223 Atherosclerosis of native arteries of extremities with rest pain, bilateral legs: Secondary | ICD-10-CM | POA: Diagnosis not present

## 2022-02-20 DIAGNOSIS — Q279 Congenital malformation of peripheral vascular system, unspecified: Secondary | ICD-10-CM

## 2022-02-20 HISTORY — PX: LOWER EXTREMITY ANGIOGRAPHY: CATH118251

## 2022-02-20 LAB — GLUCOSE, CAPILLARY
Glucose-Capillary: 233 mg/dL — ABNORMAL HIGH (ref 70–99)
Glucose-Capillary: 252 mg/dL — ABNORMAL HIGH (ref 70–99)

## 2022-02-20 LAB — CREATININE, SERUM
Creatinine, Ser: 1.37 mg/dL — ABNORMAL HIGH (ref 0.44–1.00)
GFR, Estimated: 44 mL/min — ABNORMAL LOW (ref >60)

## 2022-02-20 LAB — BUN: BUN: 33 mg/dL — ABNORMAL HIGH (ref 6–20)

## 2022-02-20 SURGERY — LOWER EXTREMITY ANGIOGRAPHY
Anesthesia: Moderate Sedation | Site: Leg Lower | Laterality: Right

## 2022-02-20 MED ORDER — SODIUM CHLORIDE 0.9% FLUSH: 3.0000 mL | Freq: Two times a day (BID) | INTRAVENOUS | Status: DC

## 2022-02-20 MED ORDER — SODIUM CHLORIDE 0.9% FLUSH: 3.0000 mL | INTRAVENOUS | Status: DC | PRN

## 2022-02-20 MED ORDER — FAMOTIDINE 20 MG PO TABS
ORAL_TABLET | ORAL | Status: AC
  Administered 2022-02-20: 40 mg via ORAL
  Filled 2022-02-20: qty 2

## 2022-02-20 MED ORDER — FAMOTIDINE 20 MG PO TABS: 40.0000 mg | ORAL_TABLET | Freq: Once | ORAL | Status: AC | PRN

## 2022-02-20 MED ORDER — METHYLPREDNISOLONE SODIUM SUCC 125 MG IJ SOLR: 125.0000 mg | Freq: Once | INTRAMUSCULAR | Status: AC | PRN

## 2022-02-20 MED ORDER — SODIUM CHLORIDE 0.9 % IV SOLN: 250.0000 mL | INTRAVENOUS | Status: DC | PRN

## 2022-02-20 MED ORDER — SODIUM CHLORIDE 0.9 % IV SOLN: INTRAVENOUS | Status: DC

## 2022-02-20 MED ORDER — CEFAZOLIN SODIUM-DEXTROSE 2-4 GM/100ML-% IV SOLN: 2.0000 g | INTRAVENOUS | Status: AC

## 2022-02-20 MED ORDER — FENTANYL CITRATE (PF) 100 MCG/2ML IJ SOLN
INTRAMUSCULAR | Status: DC | PRN
  Administered 2022-02-20: 25 ug via INTRAVENOUS
  Administered 2022-02-20: 50 ug via INTRAVENOUS

## 2022-02-20 MED ORDER — MIDAZOLAM HCL 2 MG/2ML IJ SOLN
INTRAMUSCULAR | Status: DC | PRN
  Administered 2022-02-20: 2 mg via INTRAVENOUS
  Administered 2022-02-20: 1 mg via INTRAVENOUS

## 2022-02-20 MED ORDER — HEPARIN SODIUM (PORCINE) 1000 UNIT/ML IJ SOLN
INTRAMUSCULAR | Status: AC
  Filled 2022-02-20: qty 10

## 2022-02-20 MED ORDER — SODIUM CHLORIDE 0.9 % IV SOLN
INTRAVENOUS | Status: DC
Start: 2022-02-20 — End: 2022-02-20

## 2022-02-20 MED ORDER — OXYCODONE HCL 5 MG PO TABS: 5.0000 mg | ORAL_TABLET | ORAL | Status: DC | PRN

## 2022-02-20 MED ORDER — DIPHENHYDRAMINE HCL 50 MG/ML IJ SOLN
INTRAMUSCULAR | Status: AC
  Administered 2022-02-20: 50 mg via INTRAVENOUS
  Filled 2022-02-20: qty 1

## 2022-02-20 MED ORDER — ONDANSETRON HCL 4 MG/2ML IJ SOLN: 4.0000 mg | Freq: Four times a day (QID) | INTRAMUSCULAR | Status: DC | PRN

## 2022-02-20 MED ORDER — HYDROMORPHONE HCL 1 MG/ML IJ SOLN: 1.0000 mg | Freq: Once | INTRAMUSCULAR | Status: DC | PRN

## 2022-02-20 MED ORDER — HEPARIN SODIUM (PORCINE) 1000 UNIT/ML IJ SOLN
INTRAMUSCULAR | Status: DC | PRN
  Administered 2022-02-20: 2000 [IU] via INTRAVENOUS
  Administered 2022-02-20: 6000 [IU] via INTRAVENOUS

## 2022-02-20 MED ORDER — FENTANYL CITRATE (PF) 100 MCG/2ML IJ SOLN
INTRAMUSCULAR | Status: AC
  Filled 2022-02-20: qty 2

## 2022-02-20 MED ORDER — ACETAMINOPHEN 325 MG PO TABS: 650.0000 mg | ORAL_TABLET | ORAL | Status: DC | PRN

## 2022-02-20 MED ORDER — CEFAZOLIN SODIUM-DEXTROSE 2-4 GM/100ML-% IV SOLN
INTRAVENOUS | Status: AC
  Administered 2022-02-20: 2 g via INTRAVENOUS
  Filled 2022-02-20: qty 100

## 2022-02-20 MED ORDER — MORPHINE SULFATE (PF) 4 MG/ML IV SOLN: 2.0000 mg | INTRAVENOUS | Status: DC | PRN

## 2022-02-20 MED ORDER — DIPHENHYDRAMINE HCL 50 MG/ML IJ SOLN: 50.0000 mg | Freq: Once | INTRAMUSCULAR | Status: AC | PRN

## 2022-02-20 MED ORDER — MIDAZOLAM HCL 5 MG/5ML IJ SOLN
INTRAMUSCULAR | Status: AC
  Filled 2022-02-20: qty 5

## 2022-02-20 MED ORDER — HYDRALAZINE HCL 20 MG/ML IJ SOLN: 5.0000 mg | INTRAMUSCULAR | Status: DC | PRN

## 2022-02-20 MED ORDER — LABETALOL HCL 5 MG/ML IV SOLN: 10.0000 mg | INTRAVENOUS | Status: DC | PRN

## 2022-02-20 MED ORDER — IODIXANOL 320 MG/ML IV SOLN
INTRAVENOUS | Status: DC | PRN
  Administered 2022-02-20: 80 mL via INTRA_ARTERIAL

## 2022-02-20 MED ORDER — METHYLPREDNISOLONE SODIUM SUCC 125 MG IJ SOLR
INTRAMUSCULAR | Status: AC
  Administered 2022-02-20: 125 mg via INTRAVENOUS
  Filled 2022-02-20: qty 2

## 2022-02-20 MED ORDER — MIDAZOLAM HCL 2 MG/ML PO SYRP: 8.0000 mg | ORAL_SOLUTION | Freq: Once | ORAL | Status: DC | PRN

## 2022-02-20 SURGICAL SUPPLY — 30 items
BALLN LUTONIX 018 4X220X130 (BALLOONS) ×1
BALLN ULTRASCORE 014 3X200X150 (BALLOONS) ×1
BALLOON LUTONIX 018 4X220X130 (BALLOONS) IMPLANT
BALLOON ULTRSCRE 014 3X200X150 (BALLOONS) IMPLANT
CATH 0.018 NAVICROSS ANG 135 (CATHETERS) IMPLANT
CATH ANGIO 5F PIGTAIL 65CM (CATHETERS) IMPLANT
CATH BEACON 5 .035 40 KMP TP (CATHETERS) IMPLANT
CATH BEACON 5 .038 40 KMP TP (CATHETERS) ×1
CATH SEEKER .035X135CM (CATHETERS) IMPLANT
CATH VERT 5FR 125CM (CATHETERS) IMPLANT
COVER PROBE ULTRASOUND 5X96 (MISCELLANEOUS) IMPLANT
DEVICE STARCLOSE SE CLOSURE (Vascular Products) IMPLANT
GLIDESHEATH SLENDER 7FR .021G (SHEATH) IMPLANT
GLIDEWIRE ADV .014X300CM (WIRE) IMPLANT
GLIDEWIRE ADV .035X180CM (WIRE) IMPLANT
GLIDEWIRE ADV .035X260CM (WIRE) IMPLANT
GOWN STRL REUS W/ TWL LRG LVL3 (GOWN DISPOSABLE) ×1 IMPLANT
GOWN STRL REUS W/TWL LRG LVL3 (GOWN DISPOSABLE) ×1
KIT ENCORE 26 ADVANTAGE (KITS) IMPLANT
NDL ENTRY 21GA 7CM ECHOTIP (NEEDLE) IMPLANT
NEEDLE ENTRY 21GA 7CM ECHOTIP (NEEDLE) ×1 IMPLANT
PACK ANGIOGRAPHY (CUSTOM PROCEDURE TRAY) ×1 IMPLANT
SET INTRO CAPELLA COAXIAL (SET/KITS/TRAYS/PACK) IMPLANT
SHEATH BRITE TIP 5FRX11 (SHEATH) IMPLANT
SHEATH RAABE 6FR (SHEATH) IMPLANT
STENT LIFESTREAM 8X37X80 (Permanent Stent) IMPLANT
SYR MEDRAD MARK 7 150ML (SYRINGE) IMPLANT
TUBING CONTRAST HIGH PRESS 72 (TUBING) IMPLANT
WIRE G 018X200 V18 (WIRE) IMPLANT
WIRE GUIDERIGHT .035X150 (WIRE) IMPLANT

## 2022-02-20 NOTE — Op Note (Signed)
[facility]  Percutaneous Study/Intervention Procedural Note   Date of Surgery: 02/20/2022  Surgeon:  Katha Cabal, MD.  Pre-operative Diagnosis: Atherosclerotic occlusive disease bilateral lower extremities with rest pain of the right lower extremity  Post-operative diagnosis:  Same  Procedure(s) Performed:             1.  Introduction catheter into right lower extremity 3rd order catheter placement              2.    Contrast injection right lower extremity for distal runoff             3.  Percutaneous transluminal angioplasty right superficial femoral and popliteal artery.             4.  Percutaneous transluminal angioplasty and stent placement left common iliac artery with an 8 x 37 Lifestream stent.               5.  Star close closure left superficial femoral arteriotomy  Anesthesia: Conscious sedation was administered under my direct supervision by the interventional radiology RN. IV Versed plus fentanyl were utilized. Continuous ECG, pulse oximetry and blood pressure was monitored throughout the entire procedure.  Conscious sedation was for a total of 96 minutes.  Sheath: SFA intervention performed through a 6 [place] Rabie left SFA  retrograde and the left common iliac intervention performed through a 7 French slender left SFA retrograde  Contrast: 80 cc  Fluoroscopy Time: 14.2 minutes  Indications:  Jenniffer Mohammad presents with rest pain of the right lower extremity.  Noninvasive studies as well as physical examination demonstrate significant atherosclerotic occlusive disease.  This places the patient at risk for limb loss.  Risks and benefits for angiography were reviewed all questions were answered patient has agreed to proceed.     Procedure:  Roselyne Gunn is a 62 y.o. y.o. female who was identified and appropriate procedural time out was performed.  The patient was then placed supine on the table and prepped and draped in the usual sterile  fashion.    Ultrasound was placed in the sterile sleeve and the left groin was evaluated the left  femoral artery was imaged it appeared to be 2 side-by-side arteries.  Under fluoroscopy with a hemostat the level of the ilioinguinal ligament was identified.  A point where there seem to be 1 dominant artery that was 5 to 6 mm in diameter was then selected.  This segment of femoral artery was echolucent and pulsatile indicating patency.  Image was recorded for the permanent record and under real-time visualization a microneedle was inserted into the common femoral artery microwire followed by a micro-sheath.  A J-wire was then advanced through the micro-sheath and a  5 [place] sheath was then inserted over a J-wire.  The J-wire would not advance past the proximal portion of the existing common iliac stent and hand-injection of contrast in RAO projection was performed.  This demonstrated occlusion of the stent proximally.  There was patency of the stent for several millimeters distally and then the iliac bifurcation was patent.  Using a advantage wire and a 40 cm Kumpe I was able to cross the occlusion hand-injection through the Kumpe confirmed intraluminal positioning in the aorta.  A J-wire was then advanced and a 5 French pigtail catheter was positioned at the level of T12. AP projection of the aorta was then obtained. Pigtail catheter was repositioned to above the bifurcation and a bilateral view of the pelvis was  obtained.  Subsequently a pigtail catheter with the stiff angle Glidewire was used to cross the aortic bifurcation the catheter wire were advanced down into the right distal external iliac artery. Oblique view of the femoral bifurcation was then obtained and subsequently the wire was reintroduced and the pigtail catheter negotiated into the SFA representing third order catheter placement. Distal runoff was then performed.  6000 units (2000 units of heparin 1 was given additionally later in the case)   of heparin was then given and allowed to circulate and a 6 [place] Rabie sheath was advanced up and over the bifurcation and positioned in the femoral artery  KMP  catheter and advantage Glidewire were then negotiated down into the distal popliteal and then to the level of the tibioperoneal trunk.  Distal runoff was then completed by hand injection through the catheter. The wire was then reintroduced and a 3 mm x 200 mm ultra score balloon was used to angioplasty the superficial femoral and popliteal arteries. Inflations were to 10 atmospheres for 1 minute.  2 separate inflations were required to cover the length of the lesion.  Next a 4 mm x 220 mm Lutonix drug-eluting balloon was utilized inflated to 8 atm for 1 full minute.  Follow-up imaging demonstrated patency with less than 10% residual stenosis. Distal runoff was then reassessed and found to be unchanged.  The sheath was then pulled into the distal aorta and a V18 wire advanced.  Rabie sheath was removed and a 7 French slender sheath was inserted.  Magnified imaging of the left common iliac was then obtained and an 8 mm x 37 mm Lifestream stent was then deployed across the occlusion of the common iliac.  Follow-up imaging demonstrated less than 5% residual stenosis with marked improvement in perfusion of the external iliac artery on the left.  After review of these images the sheath is pulled into the left external iliac oblique of the femoral is obtained this explained the difficulty with imaging her femoral artery at the beginning of the case.  Her femoral bifurcation of the SFA and profunda femoris actually occurs at the same level as the circumflex essentially this is at the level of the ilioinguinal ligament.  Technically, she does not have a left common femoral artery but her external iliac bifurcates into the SFA and profunda femoris.  Nevertheless, a Star close device deployed. There no immediate complications.   Findings:  The abdominal  aorta is opacified with a bolus injection contrast. Renal arteries are single bilaterally there is greater than 80% left renal artery stenosis.  Right renal arteries widely patent. The aorta itself has diffuse disease but no hemodynamically significant lesions. The common and external iliac arteries are widely patent bilaterally on the right.  On the left there is a previously placed stent in the midportion of the common iliac artery this is occluded.  The proximal 5 to 10 mm of the left common iliac artery are patent and distally just at the level of the margin of the stent it is patent.  Iliac bifurcation is patent and the internal and external iliac arteries are widely patent without hemodynamically significant stenosis..  The right common femoral is widely patent as is the profunda femoris.  The SFA is quite small and does indeed have a significant stenosis, the midportion occludes over a distance of several centimeters then there is a short segment of above-knee popliteal just distal to Hunter's canal that reconstitutes and then the popliteal occludes and remains occluded down  to the level of the tibial plateau.  Trifurcation is occluded.  There is reconstitution of a anterior tibial as well as the peroneal.  Posterior tibial is absent.  Extensive collaterals are noted at the level of the tibial plateau.    There is occlusion of the common iliac artery on the left as noted above.  Following angioplasty to 4 mg the SFA and popliteal is now patent with in-line flow and looks quite nice with less than 10% residual stenosis.  There are extensive collaterals at the level of the knee that are filling.  I am unable to cross into the anterior tibial so there is not direct inline flow to the foot.  Following angioplasty and stent placement the left common iliac artery is widely patent with less than 5% residual stenosis.    Summary: Successful recanalization right and left lower extremity for limb  salvage                        Disposition: Patient was taken to the recovery room in stable condition having tolerated the procedure well.  Britni Driscoll, Dolores Lory 02/20/2022,10:00 AM

## 2022-02-20 NOTE — Interval H&P Note (Signed)
History and Physical Interval Note:  02/20/2022 8:05 AM  Shannon Russell  has presented today for surgery, with the diagnosis of RLE Angio w intervention   BARD   PAD.  The various methods of treatment have been discussed with the patient and family. After consideration of risks, benefits and other options for treatment, the patient has consented to  Procedure(s): Lower Extremity Angiography (Right) as a surgical intervention.  The patient's history has been reviewed, patient examined, no change in status, stable for surgery.  I have reviewed the patient's chart and labs.  Questions were answered to the patient's satisfaction.     [name]

## 2022-02-21 ENCOUNTER — Encounter: Payer: Self-pay | Admitting: Vascular Surgery

## 2022-02-23 ENCOUNTER — Other Ambulatory Visit: Payer: Self-pay | Admitting: Cardiovascular Disease

## 2022-02-26 ENCOUNTER — Ambulatory Visit (INDEPENDENT_AMBULATORY_CARE_PROVIDER_SITE_OTHER): Payer: Medicaid Other | Admitting: Vascular Surgery

## 2022-02-26 ENCOUNTER — Encounter (INDEPENDENT_AMBULATORY_CARE_PROVIDER_SITE_OTHER): Payer: Self-pay | Admitting: Vascular Surgery

## 2022-02-26 VITALS — BP 90/59 | HR 83 | Resp 16 | Ht 63.0 in | Wt 171.8 lb

## 2022-02-26 DIAGNOSIS — J449 Chronic obstructive pulmonary disease, unspecified: Secondary | ICD-10-CM

## 2022-02-26 DIAGNOSIS — E782 Mixed hyperlipidemia: Secondary | ICD-10-CM

## 2022-02-26 DIAGNOSIS — E1159 Type 2 diabetes mellitus with other circulatory complications: Secondary | ICD-10-CM

## 2022-02-26 DIAGNOSIS — I890 Lymphedema, not elsewhere classified: Secondary | ICD-10-CM | POA: Diagnosis not present

## 2022-02-26 DIAGNOSIS — I70219 Atherosclerosis of native arteries of extremities with intermittent claudication, unspecified extremity: Secondary | ICD-10-CM | POA: Diagnosis not present

## 2022-02-26 NOTE — Progress Notes (Unsigned)
MRN : [id number]  Shannon Russell is a 62 y.o. (08/14/1960) female who presents with chief complaint of check circulation.  History of Present Illness:   The patient returns to the office for followup and review status post angiogram with intervention on 02/20/2022.   Procedure:  Percutaneous transluminal angioplasty right superficial femoral and popliteal artery. 2.    Percutaneous transluminal angioplasty and stent placement left common iliac artery with an 8 x 37 Lifestream stent.    The patient notes improvement in the lower extremity symptoms. No interval shortening of the patient's claudication distance or rest pain symptoms. No new ulcers or wounds have occurred since the last visit.  The patient is sooner than expected at the request of her primary regarding right leg swelling.  The swelling has appeared abruptly after her intervention.  She notes there is some mild pain or discomfort associated with swelling continues.  She notes it is primarily in the foot and ankle.  There have not been any interval development of a ulcerations or wounds.  Since the previous visit the patient has not been wearing graduated compression stockings.   The patient also states elevation during the day and exercise (such as walking) is being done too.  There have been no significant changes to the patient's overall health care.  No documented history of amaurosis fugax or recent TIA symptoms. There are no recent neurological changes noted.  The patient denies recent episodes of angina or shortness of breath.    No outpatient medications have been marked as taking for the 02/26/22 encounter (Appointment) with Delana Meyer, Dolores Lory, MD.    Past Medical History:  Diagnosis Date   Arthritis    Asthma    Cancer [facility])    COPD (chronic obstructive pulmonary disease) ([place])    Depression    Diabetes mellitus without complication ([place])    H/O blood clots    PAD (peripheral artery  disease) ([place])    Thyroid disease     Past Surgical History:  Procedure Laterality Date   Bypass right leg  2015   [place] Right 02/20/2022   Procedure: Lower Extremity Angiography;  Surgeon: Katha Cabal, MD;  Location: [place] [facility];  Service: Cardiovascular;  Laterality: Right;   Mesh implant right leg  2015   THYROIDECTOMY  2013   TUBAL LIGATION  1993    Social History Social History   Tobacco Use   Smoking status: Every Day    Packs/day: 0.50    Years: 45.00    Total pack years: 22.50    Types: Cigarettes   Smokeless tobacco: Never   Tobacco comments:    6 ciggs a day.  Vaping Use   Vaping Use: Never used  Substance Use Topics   Alcohol use: Not Currently   Drug use: Not Currently    Family History Family History  Problem Relation Age of Onset   Heart failure Mother    Depression Mother    Peripheral Artery Disease Sister    Cancer Sister    Clotting disorder Sister    Depression Sister    Thyroid disease Sister     Allergies  Allergen Reactions   Advair Diskus [Fluticasone-Salmeterol] Hives   Iodine Hives and Rash    Other reaction(s): Skin Rashes, Hives  Pt states she need premedication for contrast exposure     REVIEW OF SYSTEMS (  Negative unless checked)  Constitutional: []$ Weight loss  []$ Fever  []$ Chills Cardiac: []$ Chest pain   []$ Chest pressure   []$ Palpitations   []$ Shortness of breath when laying flat   []$ Shortness of breath with exertion. Vascular:  [x]$ Pain in legs with walking   []$ Pain in legs at rest  []$ History of DVT   []$ Phlebitis   [x]$ Swelling in legs   []$ Varicose veins   []$ Non-healing ulcers Pulmonary:   []$ Uses home oxygen   []$ Productive cough   []$ Hemoptysis   []$ Wheeze  []$ COPD   []$ Asthma Neurologic:  []$ Dizziness   []$ Seizures   []$ History of stroke   []$ History of TIA  []$ Aphasia   []$ Vissual changes   []$ Weakness or numbness in arm   []$ Weakness or numbness in leg Musculoskeletal:   []$ Joint  swelling   []$ Joint pain   []$ Low back pain Hematologic:  []$ Easy bruising  []$ Easy bleeding   []$ Hypercoagulable state   []$ Anemic Gastrointestinal:  []$ Diarrhea   []$ Vomiting  []$ Gastroesophageal reflux/heartburn   []$ Difficulty swallowing. Genitourinary:  []$ Chronic kidney disease   []$ Difficult urination  []$ Frequent urination   []$ Blood in urine Skin:  []$ Rashes   []$ Ulcers  Psychological:  []$ History of anxiety   []$  History of major depression.  Physical Examination  There were no vitals filed for this visit. There is no height or weight on file to calculate BMI. Gen: WD/WN, NAD Head: [place]/AT, No temporalis wasting.  Ear/Nose/Throat: Hearing grossly intact, nares w/o erythema or drainage Eyes: PER, EOMI, sclera nonicteric.  Neck: Supple, no masses.  No bruit or JVD.  Pulmonary:  Good air movement, no audible wheezing, no use of accessory muscles.  Cardiac: RRR, normal S1, S2, no Murmurs. Vascular:  mild trophic changes, no open wounds the right ankle has 2-3+ edema it is warm nontender Vessel Right Left  Radial Palpable Palpable  PT Not Palpable Not Palpable  DP Not Palpable Not Palpable  Gastrointestinal: soft, non-distended. No guarding/no peritoneal signs.  Musculoskeletal: M/S 5/5 throughout.  No visible deformity.  Neurologic: CN 2-12 intact. Pain and light touch intact in extremities.  Symmetrical.  Speech is fluent. Motor exam as listed above. Psychiatric: Judgment intact, Mood & affect appropriate for pt's clinical situation. Dermatologic: No rashes or ulcers noted.  No changes consistent with cellulitis.   CBC Lab Results  Component Value Date   WBC 14.6 (H) 07/28/2021   HGB 11.2 (L) 07/28/2021   HCT 33.9 (L) 07/28/2021   MCV 94.4 07/28/2021   PLT 243 07/28/2021    BMET    Component Value Date/Time   NA 139 07/29/2021 0409   K 4.5 07/29/2021 0409   CL 108 07/29/2021 0409   CO2 23 07/29/2021 0409   GLUCOSE 335 (H) 07/29/2021 0409   BUN 33 (H) 02/20/2022 0744   CREATININE  1.37 (H) 02/20/2022 0744   CALCIUM 9.1 07/29/2021 0409   GFRNONAA 44 (L) 02/20/2022 0744   GFRAA >60 01/20/2019 0529   Estimated Creatinine Clearance: 43.9 mL/min (A) (by C-G formula based on SCr of 1.37 mg/dL (H)).  COAG No results found for: "INR", "PROTIME"  Radiology PERIPHERAL VASCULAR CATHETERIZATION  Result Date: 02/20/2022 See surgical note for result.    Assessment/Plan 1. Lymphedema Recommend:  This appears to represent reperfusion edema from her recent SFA intervention.  In general it should be self-limited.  Simple graduated compression should be the mainstay of management and this was discussed extensively with the patient.  I have had a long discussion with the patient regarding swelling and why it  causes symptoms.  Patient will  begin wearing graduated compression on a daily basis a prescription was given. The patient will  wear the stockings first thing in the morning and removing them in the evening. The patient is instructed specifically not to sleep in the stockings.   In addition, behavioral modification will be initiated.  This will include frequent elevation, use of over the counter pain medications and exercise such as walking.  Consideration for a lymph pump will also be made based upon the effectiveness of conservative therapy.  This would help to improve the edema control and prevent sequela such as ulcers and infections   Patient should undergo duplex ultrasound of the venous system to ensure that DVT or reflux is not present.  The patient will follow-up with me after the ultrasound.   2. 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  She will keep her already scheduled appointment with noninvasive studies  The patient is status post successful angiogram with intervention.  The patient reports that the claudication symptoms and  leg pain has improved.   The patient denies lifestyle limiting changes at this point in time.  No further invasive studies, angiography or surgery at this time The patient should continue walking and begin a more formal exercise program.  The patient should continue antiplatelet therapy and aggressive treatment of the lipid abnormalities  Continued surveillance is indicated as atherosclerosis is likely to progress with time.    Patient should undergo noninvasive studies as ordered. The patient will follow up with me to review the studies.   3. Chronic obstructive pulmonary disease, unspecified COPD type ([place]) Continue pulmonary medications and aerosols as already ordered, these medications have been reviewed and there are no changes at this time.   4. Mixed hyperlipidemia Continue statin as ordered and reviewed, no changes at this time  5. Type 2 diabetes mellitus with other circulatory complication, without long-term current use of insulin (HCC) Continue hypoglycemic medications as already ordered, these medications have been reviewed and there are no changes at this time.  Hgb A1C to be monitored as already arranged by primary service     [name], MD  02/26/2022 8:54 AM

## 2022-02-28 DIAGNOSIS — I70219 Atherosclerosis of native arteries of extremities with intermittent claudication, unspecified extremity: Secondary | ICD-10-CM | POA: Insufficient documentation

## 2022-02-28 DIAGNOSIS — I890 Lymphedema, not elsewhere classified: Secondary | ICD-10-CM | POA: Insufficient documentation

## 2022-02-28 DIAGNOSIS — E119 Type 2 diabetes mellitus without complications: Secondary | ICD-10-CM | POA: Insufficient documentation

## 2022-03-08 ENCOUNTER — Other Ambulatory Visit (INDEPENDENT_AMBULATORY_CARE_PROVIDER_SITE_OTHER): Payer: Self-pay | Admitting: Vascular Surgery

## 2022-03-08 DIAGNOSIS — Z9889 Other specified postprocedural states: Secondary | ICD-10-CM

## 2022-03-09 ENCOUNTER — Other Ambulatory Visit: Payer: Self-pay | Admitting: Cardiovascular Disease

## 2022-03-09 DIAGNOSIS — I10 Essential (primary) hypertension: Secondary | ICD-10-CM

## 2022-03-12 ENCOUNTER — Other Ambulatory Visit: Payer: Self-pay | Admitting: Nurse Practitioner

## 2022-03-12 ENCOUNTER — Ambulatory Visit (INDEPENDENT_AMBULATORY_CARE_PROVIDER_SITE_OTHER): Payer: Medicaid Other

## 2022-03-12 ENCOUNTER — Encounter (INDEPENDENT_AMBULATORY_CARE_PROVIDER_SITE_OTHER): Payer: Self-pay | Admitting: Vascular Surgery

## 2022-03-12 ENCOUNTER — Ambulatory Visit (INDEPENDENT_AMBULATORY_CARE_PROVIDER_SITE_OTHER): Payer: Medicaid Other | Admitting: Vascular Surgery

## 2022-03-12 VITALS — BP 100/67 | HR 86 | Resp 18 | Ht 62.0 in | Wt 169.2 lb

## 2022-03-12 DIAGNOSIS — Z9889 Other specified postprocedural states: Secondary | ICD-10-CM

## 2022-03-12 DIAGNOSIS — I70219 Atherosclerosis of native arteries of extremities with intermittent claudication, unspecified extremity: Secondary | ICD-10-CM

## 2022-03-12 DIAGNOSIS — I739 Peripheral vascular disease, unspecified: Secondary | ICD-10-CM | POA: Diagnosis not present

## 2022-03-12 DIAGNOSIS — J449 Chronic obstructive pulmonary disease, unspecified: Secondary | ICD-10-CM

## 2022-03-12 DIAGNOSIS — E782 Mixed hyperlipidemia: Secondary | ICD-10-CM

## 2022-03-12 DIAGNOSIS — E1151 Type 2 diabetes mellitus with diabetic peripheral angiopathy without gangrene: Secondary | ICD-10-CM

## 2022-03-12 DIAGNOSIS — I10 Essential (primary) hypertension: Secondary | ICD-10-CM

## 2022-03-13 ENCOUNTER — Other Ambulatory Visit: Payer: Self-pay

## 2022-03-13 MED ORDER — PREGABALIN 75 MG PO CAPS: 75.0000 mg | ORAL_CAPSULE | Freq: Three times a day (TID) | ORAL | 3 refills | Status: DC

## 2022-03-13 MED ORDER — BACLOFEN 10 MG PO TABS: 10.0000 mg | ORAL_TABLET | Freq: Two times a day (BID) | ORAL | 3 refills | Status: DC | PRN

## 2022-03-13 MED ORDER — CHOLECALCIFEROL 1.25 MG (50000 UT) PO CAPS: ORAL_CAPSULE | ORAL | 3 refills | Status: DC

## 2022-03-13 MED ORDER — ATORVASTATIN CALCIUM 80 MG PO TABS: 80.0000 mg | ORAL_TABLET | Freq: Every day | ORAL | 3 refills | Status: DC

## 2022-03-13 MED ORDER — CLONIDINE HCL 0.1 MG PO TABS: 0.1000 mg | ORAL_TABLET | Freq: Every day | ORAL | 3 refills | Status: DC

## 2022-03-17 ENCOUNTER — Encounter (INDEPENDENT_AMBULATORY_CARE_PROVIDER_SITE_OTHER): Payer: Self-pay | Admitting: Vascular Surgery

## 2022-03-17 NOTE — Progress Notes (Signed)
MRN : [id number]  Shannon Russell is a 62 y.o. (09/06/1960) female who presents with chief complaint of check circulation.  History of Present Illness:  The patient returns to the office for followup and review status post angiogram with intervention on 02/20/2022.    Procedure:  Percutaneous transluminal angioplasty right superficial femoral and popliteal artery. 2.    Percutaneous transluminal angioplasty and stent placement left common iliac artery with an 8 x 37 Lifestream stent.     The patient notes improvement in the lower extremity symptoms. No interval shortening of the patient's claudication distance or rest pain symptoms. No new ulcers or wounds have occurred since the last visit.   The patient is sooner than expected at the request of her primary regarding right leg swelling.  The swelling has appeared abruptly after her intervention.  She notes there is some mild pain or discomfort associated with swelling continues.  She notes it is primarily in the foot and ankle.  There have not been any interval development of a ulcerations or wounds.   Since the previous visit the patient has not been wearing graduated compression stockings.    The patient also states elevation during the day and exercise (such as walking) is being done too.   There have been no significant changes to the patient's overall health care.   No documented history of amaurosis fugax or recent TIA symptoms. There are no recent neurological changes noted.   The patient denies recent episodes of angina or shortness of breath.    ABI's Rt=0.70 and Lt=1.03 (previous ABI's Rt=0.57 and Lt=0.68)   Current Meds  Medication Sig   albuterol (PROVENTIL HFA;VENTOLIN HFA) 108 (90 Base) MCG/ACT inhaler Inhale 2 puffs into the lungs every 6 (six) hours as needed for wheezing.   aspirin EC 81 MG tablet Take 81 mg by mouth daily. Swallow whole.   budesonide (PULMICORT) 0.5 MG/2ML nebulizer solution Take 4  mLs (1 mg total) by nebulization 2 (two) times daily.   clopidogrel (PLAVIX) 75 MG tablet Take 75 mg by mouth daily.   fexofenadine (ALLEGRA) 180 MG tablet Take 180 mg by mouth every morning.   furosemide (LASIX) 40 MG tablet Take 1 tablet by mouth once daily   insulin glargine (LANTUS SOLOSTAR) 100 UNIT/ML Solostar Pen Inject 16 Units into the skin at bedtime.   ipratropium-albuterol (DUONEB) 0.5-2.5 (3) MG/3ML SOLN Take 3 mLs by nebulization every 6 (six) hours as needed.   levothyroxine (SYNTHROID) 100 MCG tablet Take 100 mcg by mouth every morning.   lisinopril (ZESTRIL) 5 MG tablet Take 1 tablet by mouth once daily   metFORMIN (GLUCOPHAGE) 1000 MG tablet Take 1,000 mg by mouth every morning.   montelukast (SINGULAIR) 10 MG tablet Take 1 tablet by mouth at bedtime.   nicotine (NICODERM CQ - DOSED IN MG/24 HOURS) 14 mg/24hr patch Place 1 patch (14 mg total) onto the skin daily.   pantoprazole (PROTONIX) 40 MG tablet Take 1 tablet (40 mg total) by mouth 2 (two) times daily.   SPIRIVA HANDIHALER 18 MCG inhalation capsule daily.   temazepam (RESTORIL) 15 MG capsule Take 15 mg by mouth at bedtime as needed.   TRELEGY ELLIPTA 100-62.5-25 MCG/ACT AEPB SMARTSIG:1 Puff(s) Via Inhaler Every Morning   [DISCONTINUED] atorvastatin (LIPITOR) 80 MG tablet Take 80 mg by mouth daily.   [DISCONTINUED] Cholecalciferol 1.25 MG (50000 UT) capsule Vitamin D3 1.25 MG (50000 UT) Oral Capsule  QTY: 0 capsule Days: 0 Refills: 0  Written: 02/09/20 Patient Instructions: qaw   [DISCONTINUED] cloNIDine (CATAPRES) 0.1 MG tablet Take 0.1 mg by mouth at bedtime.   [DISCONTINUED] pregabalin (LYRICA) 75 MG capsule Take 75 mg by mouth 3 (three) times daily.    Past Medical History:  Diagnosis Date   Arthritis    Asthma    Cancer ([place])    COPD (chronic obstructive pulmonary disease) ([place])    Depression    Diabetes mellitus without complication ([place])    H/O blood clots    PAD (peripheral artery disease) ([place])    Thyroid  disease     Past Surgical History:  Procedure Laterality Date   Bypass right leg  2015   [place] Right 02/20/2022   Procedure: Lower Extremity Angiography;  Surgeon: Katha Cabal, MD;  Location: [place] [facility];  Service: Cardiovascular;  Laterality: Right;   Mesh implant right leg  2015   THYROIDECTOMY  2013   TUBAL LIGATION  1993    Social History Social History   Tobacco Use   Smoking status: Every Day    Packs/day: 0.50    Years: 45.00    Total pack years: 22.50    Types: Cigarettes   Smokeless tobacco: Never   Tobacco comments:    6 ciggs a day.  Vaping Use   Vaping Use: Never used  Substance Use Topics   Alcohol use: Not Currently   Drug use: Not Currently    Family History Family History  Problem Relation Age of Onset   Heart failure Mother    Depression Mother    Peripheral Artery Disease Sister    Cancer Sister    Clotting disorder Sister    Depression Sister    Thyroid disease Sister     Allergies  Allergen Reactions   Advair Diskus [Fluticasone-Salmeterol] Hives   Iodine Hives and Rash    Other reaction(s): Skin Rashes, Hives  Pt states she need premedication for contrast exposure     REVIEW OF SYSTEMS (Negative unless checked)  Constitutional: '[]'$ Weight loss  '[]'$ Fever  '[]'$ Chills Cardiac: '[]'$ Chest pain   '[]'$ Chest pressure   '[]'$ Palpitations   '[]'$ Shortness of breath when laying flat   '[]'$ Shortness of breath with exertion. Vascular:  '[x]'$ Pain in legs with walking   '[]'$ Pain in legs at rest  '[]'$ History of DVT   '[]'$ Phlebitis   '[]'$ Swelling in legs   '[]'$ Varicose veins   '[]'$ Non-healing ulcers Pulmonary:   '[]'$ Uses home oxygen   '[]'$ Productive cough   '[]'$ Hemoptysis   '[]'$ Wheeze  '[]'$ COPD   '[]'$ Asthma Neurologic:  '[]'$ Dizziness   '[]'$ Seizures   '[]'$ History of stroke   '[]'$ History of TIA  '[]'$ Aphasia   '[]'$ Vissual changes   '[]'$ Weakness or numbness in arm   '[]'$ Weakness or numbness in leg Musculoskeletal:   '[]'$ Joint swelling   '[]'$ Joint pain    '[]'$ Low back pain Hematologic:  '[]'$ Easy bruising  '[]'$ Easy bleeding   '[]'$ Hypercoagulable state   '[]'$ Anemic Gastrointestinal:  '[]'$ Diarrhea   '[]'$ Vomiting  '[]'$ Gastroesophageal reflux/heartburn   '[]'$ Difficulty swallowing. Genitourinary:  '[]'$ Chronic kidney disease   '[]'$ Difficult urination  '[]'$ Frequent urination   '[]'$ Blood in urine Skin:  '[]'$ Rashes   '[]'$ Ulcers  Psychological:  '[]'$ History of anxiety   '[]'$  History of major depression.  Physical Examination  Vitals:   03/12/22 1536  BP: 100/67  Pulse: 86  Resp: 18  Weight: 169 lb 3.2 oz (76.7 kg)  Height: '5\' 2"'$  (1.575 m)   Body mass index is 30.95  kg/m. Gen: WD/WN, NAD Head: [place]/AT, No temporalis wasting.  Ear/Nose/Throat: Hearing grossly intact, nares w/o erythema or drainage Eyes: PER, EOMI, sclera nonicteric.  Neck: Supple, no masses.  No bruit or JVD.  Pulmonary:  Good air movement, no audible wheezing, no use of accessory muscles.  Cardiac: RRR, normal S1, S2, no Murmurs. Vascular:  mild trophic changes, no open wounds Vessel Right Left  Radial Palpable Palpable  PT Trace Palpable Not Palpable  DP Not Palpable Not Palpable  Gastrointestinal: soft, non-distended. No guarding/no peritoneal signs.  Musculoskeletal: M/S 5/5 throughout.  No visible deformity.  Neurologic: CN 2-12 intact. Pain and light touch intact in extremities.  Symmetrical.  Speech is fluent. Motor exam as listed above. Psychiatric: Judgment intact, Mood & affect appropriate for pt's clinical situation. Dermatologic: No rashes or ulcers noted.  No changes consistent with cellulitis.   CBC Lab Results  Component Value Date   WBC 14.6 (H) 07/28/2021   HGB 11.2 (L) 07/28/2021   HCT 33.9 (L) 07/28/2021   MCV 94.4 07/28/2021   PLT 243 07/28/2021    BMET    Component Value Date/Time   NA 139 07/29/2021 0409   K 4.5 07/29/2021 0409   CL 108 07/29/2021 0409   CO2 23 07/29/2021 0409   GLUCOSE 335 (H) 07/29/2021 0409   BUN 33 (H) 02/20/2022 0744   CREATININE 1.37 (H) 02/20/2022  0744   CALCIUM 9.1 07/29/2021 0409   GFRNONAA 44 (L) 02/20/2022 0744   GFRAA >60 01/20/2019 0529   CrCl cannot be calculated (Patient's most recent lab result is older than the maximum 21 days allowed.).  COAG No results found for: "INR", "PROTIME"  Radiology PERIPHERAL VASCULAR CATHETERIZATION  Result Date: 02/20/2022 See surgical note for result.    Assessment/Plan 1. 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  The patient reports that the claudication symptoms and leg pain has improved.   The patient denies lifestyle limiting changes at this point in time.  No further invasive studies, angiography or surgery at this time The patient should continue walking and begin a more formal exercise program.  The patient should continue antiplatelet therapy and aggressive treatment of the lipid abnormalities  Continued surveillance is indicated as atherosclerosis is likely to progress with time.    Patient should undergo noninvasive studies as ordered. The patient will follow up with me to review the studies.  - VAS [place] LOWER EXTREMITY ARTERIAL DUPLEX; Future - VAS [place] ABI WITH/WO TBI; Future  2. Type 2 diabetes mellitus with diabetic peripheral angiopathy without gangrene, without long-term current use of insulin (HCC) Continue hypoglycemic medications as already ordered, these medications have been reviewed and there are no changes at this time.  Hgb A1C to be monitored as already arranged by primary service  3. Chronic obstructive pulmonary disease, unspecified COPD type ([place]) Continue pulmonary medications and aerosols as already ordered, these medications have been reviewed and there are no changes at this time.   4. Mixed hyperlipidemia Continue statin as ordered and reviewed, no changes at this time    [name],  MD  03/17/2022 11:52 AM

## 2022-03-18 LAB — VAS US ABI WITH/WO TBI
Left ABI: 1.03
Right ABI: 0.7

## 2022-04-09 ENCOUNTER — Other Ambulatory Visit: Payer: Self-pay | Admitting: Cardiovascular Disease

## 2022-04-24 ENCOUNTER — Ambulatory Visit: Payer: Medicaid Other | Admitting: Nurse Practitioner

## 2022-04-30 ENCOUNTER — Ambulatory Visit: Payer: Medicaid Other | Admitting: Nurse Practitioner

## 2022-05-14 ENCOUNTER — Encounter: Payer: Self-pay | Admitting: Nurse Practitioner

## 2022-05-14 ENCOUNTER — Ambulatory Visit: Payer: Medicaid Other | Admitting: Nurse Practitioner

## 2022-05-14 VITALS — BP 104/64 | HR 104 | Ht 63.0 in | Wt 170.0 lb

## 2022-05-14 DIAGNOSIS — J329 Chronic sinusitis, unspecified: Secondary | ICD-10-CM

## 2022-05-14 DIAGNOSIS — F17208 Nicotine dependence, unspecified, with other nicotine-induced disorders: Secondary | ICD-10-CM

## 2022-05-14 DIAGNOSIS — E782 Mixed hyperlipidemia: Secondary | ICD-10-CM | POA: Diagnosis not present

## 2022-05-14 DIAGNOSIS — J441 Chronic obstructive pulmonary disease with (acute) exacerbation: Secondary | ICD-10-CM | POA: Diagnosis not present

## 2022-05-14 MED ORDER — PREDNISONE 50 MG PO TABS: ORAL_TABLET | ORAL | 0 refills | Status: DC

## 2022-05-14 MED ORDER — AMOXICILLIN-POT CLAVULANATE 875-125 MG PO TABS: 1.0000 | ORAL_TABLET | Freq: Two times a day (BID) | ORAL | 0 refills | Status: DC

## 2022-05-14 NOTE — Progress Notes (Signed)
Established Patient Office Visit  Subjective:  Patient ID: Shannon Russell, female    DOB: 1960/02/26  Age: 62 y.o. MRN: [id number]  Chief Complaint  Patient presents with   Follow-up    3 month follow up, discuss lab results.    Four month follow up, patient will return for fasting labs.  Sinus and lung congestion since last week.  Has not taken anything OTC.      No other concerns at this time.   Past Medical History:  Diagnosis Date   Arthritis    Asthma    Cancer (HCC)    COPD (chronic obstructive pulmonary disease) (HCC)    Depression    Diabetes mellitus without complication (HCC)    H/O blood clots    PAD (peripheral artery disease) (HCC)    Thyroid disease     Past Surgical History:  Procedure Laterality Date   Bypass right leg  2015   CHOLECYSTECTOMY  1981   LOWER EXTREMITY ANGIOGRAPHY Right 02/20/2022   Procedure: Lower Extremity Angiography;  Surgeon: Renford Dills, MD;  Location: [facility];  Service: Cardiovascular;  Laterality: Right;   Mesh implant right leg  2015   THYROIDECTOMY  2013   TUBAL LIGATION  1993    Social History   Socioeconomic History   Marital status: Legally Separated    Spouse name: Not on file   Number of children: Not on file   Years of education: Not on file   Highest education level: Not on file  Occupational History   Not on file  Tobacco Use   Smoking status: Every Day    Packs/day: 0.50    Years: 45.00    Additional pack years: 0.00    Total pack years: 22.50    Types: Cigarettes   Smokeless tobacco: Never   Tobacco comments:    6 ciggs a day.  Vaping Use   Vaping Use: Never used  Substance and Sexual Activity   Alcohol use: Not Currently   Drug use: Not Currently   Sexual activity: Not on file  Other Topics Concern   Not on file  Social History Narrative   Not on file   Social Determinants of Health   Financial Resource Strain: Not on file  Food Insecurity: Not on file  Transportation  Needs: Not on file  Physical Activity: Not on file  Stress: Not on file  Social Connections: Not on file  Intimate Partner Violence: Not on file    Family History  Problem Relation Age of Onset   Heart failure Mother    Depression Mother    Peripheral Artery Disease Sister    Cancer Sister    Clotting disorder Sister    Depression Sister    Thyroid disease Sister     Allergies  Allergen Reactions   Advair Diskus [Fluticasone-Salmeterol] Hives   Iodine Hives and Rash    Other reaction(s): Skin Rashes, Hives  Pt states she need premedication for contrast exposure    Review of Systems  Constitutional:  Positive for malaise/fatigue.  HENT:  Positive for hearing loss, sinus pain and sore throat.   Eyes: Negative.   Respiratory:  Positive for cough, sputum production, shortness of breath and wheezing.   Cardiovascular: Negative.   Gastrointestinal: Negative.   Genitourinary: Negative.   Musculoskeletal: Negative.   Skin: Negative.   Neurological: Negative.   Endo/Heme/Allergies: Negative.   Psychiatric/Behavioral:  The patient has insomnia.        Objective:  BP 104/64   Pulse (!) 104   Ht 5\' 3"  (1.6 m)   Wt 170 lb (77.1 kg)   SpO2 97%   BMI 30.11 kg/m   Vitals:   05/14/22 1325  BP: 104/64  Pulse: (!) 104  Height: 5\' 3"  (1.6 m)  Weight: 170 lb (77.1 kg)  SpO2: 97%  BMI (Calculated): 30.12    Physical Exam Vitals reviewed.  Constitutional:      Appearance: Normal appearance.  HENT:     Head: Normocephalic.     Nose: Congestion present.     Mouth/Throat:     Mouth: Mucous membranes are moist.  Eyes:     Pupils: Pupils are equal, round, and reactive to light.  Cardiovascular:     Rate and Rhythm: Normal rate and regular rhythm.  Pulmonary:     Breath sounds: Wheezing and rhonchi present.  Abdominal:     General: Bowel sounds are normal.     Palpations: Abdomen is soft.  Musculoskeletal:        General: Normal range of motion.     Cervical  back: Normal range of motion and neck supple.  Skin:    General: Skin is warm and dry.  Neurological:     Mental Status: She is oriented to person, place, and time.  Psychiatric:        Mood and Affect: Mood normal.        Behavior: Behavior normal.      No results found for any visits on 05/14/22.  Recent Results (from the past 2160 hour(s))  Glucose, capillary     Status: Abnormal   Collection Time: 02/20/22  7:40 AM  Result Value Ref Range   Glucose-Capillary 233 (H) 70 - 99 mg/dL    Comment: Glucose reference range applies only to samples taken after fasting for at least 8 hours.  BUN     Status: Abnormal   Collection Time: 02/20/22  7:44 AM  Result Value Ref Range   BUN 33 (H) 6 - 20 mg/dL    Comment: Performed at [facility], [address] [place], [place] [address]  Creatinine, serum     Status: Abnormal   Collection Time: 02/20/22  7:44 AM  Result Value Ref Range   Creatinine, Ser 1.37 (H) 0.44 - 1.00 mg/dL   GFR, Estimated 44 (L) >60 mL/min    Comment: (NOTE) Calculated using the CKD-EPI Creatinine Equation (2021) Performed at [facility], [address] [place], [place] [address]   Glucose, capillary     Status: Abnormal   Collection Time: 02/20/22 10:19 AM  Result Value Ref Range   Glucose-Capillary 252 (H) 70 - 99 mg/dL    Comment: Glucose reference range applies only to samples taken after fasting for at least 8 hours.  VAS [place] ABI WITH/WO TBI     Status: None   Collection Time: 03/12/22  3:23 PM  Result Value Ref Range   Right ABI .70    Left ABI 1.03       Assessment & Plan:   Problem List Items Addressed This Visit   None   No follow-ups on file.   Total time spent: 35 minutes  Orson Eva, NP  05/14/2022

## 2022-05-14 NOTE — Patient Instructions (Signed)
1) Fasting labs next week 2) CXR 3) Pred/Augmentin 4) Follow up appt in 1 week

## 2022-05-15 ENCOUNTER — Ambulatory Visit (INDEPENDENT_AMBULATORY_CARE_PROVIDER_SITE_OTHER): Payer: Medicaid Other

## 2022-05-15 ENCOUNTER — Other Ambulatory Visit: Payer: Medicaid Other

## 2022-05-15 ENCOUNTER — Other Ambulatory Visit: Payer: Self-pay | Admitting: Nurse Practitioner

## 2022-05-15 DIAGNOSIS — J441 Chronic obstructive pulmonary disease with (acute) exacerbation: Secondary | ICD-10-CM | POA: Diagnosis not present

## 2022-05-16 LAB — CBC WITH DIFFERENTIAL/PLATELET
Basophils Absolute: 0 10*3/uL (ref 0.0–0.2)
Basos: 0 %
EOS (ABSOLUTE): 0 10*3/uL (ref 0.0–0.4)
Eos: 0 %
Hematocrit: 36.9 % (ref 34.0–46.6)
Hemoglobin: 12.2 g/dL (ref 11.1–15.9)
Immature Grans (Abs): 0.1 10*3/uL (ref 0.0–0.1)
Immature Granulocytes: 1 %
Lymphocytes Absolute: 0.9 10*3/uL (ref 0.7–3.1)
Lymphs: 8 %
MCH: 30.1 pg (ref 26.6–33.0)
MCHC: 33.1 g/dL (ref 31.5–35.7)
MCV: 91 fL (ref 79–97)
Monocytes Absolute: 0.4 10*3/uL (ref 0.1–0.9)
Monocytes: 3 %
Neutrophils Absolute: 9.9 10*3/uL — ABNORMAL HIGH (ref 1.4–7.0)
Neutrophils: 88 %
Platelets: 262 10*3/uL (ref 150–450)
RBC: 4.05 x10E6/uL (ref 3.77–5.28)
RDW: 13.9 % (ref 11.7–15.4)
WBC: 11.4 10*3/uL — ABNORMAL HIGH (ref 3.4–10.8)

## 2022-05-16 LAB — LIPID PANEL W/O CHOL/HDL RATIO
Cholesterol, Total: 112 mg/dL (ref 100–199)
HDL: 30 mg/dL — ABNORMAL LOW (ref >39)
LDL Chol Calc (NIH): 51 mg/dL (ref 0–99)
Triglycerides: 187 mg/dL — ABNORMAL HIGH (ref 0–149)
VLDL Cholesterol Cal: 31 mg/dL (ref 5–40)

## 2022-05-16 LAB — COMPREHENSIVE METABOLIC PANEL
ALT: 17 IU/L (ref 0–32)
AST: 14 IU/L (ref 0–40)
Albumin/Globulin Ratio: 1.4 (ref 1.2–2.2)
Albumin: 4.1 g/dL (ref 3.9–4.9)
Alkaline Phosphatase: 133 IU/L — ABNORMAL HIGH (ref 44–121)
BUN/Creatinine Ratio: 16 (ref 12–28)
BUN: 21 mg/dL (ref 8–27)
Bilirubin Total: 0.2 mg/dL (ref 0.0–1.2)
CO2: 19 mmol/L — ABNORMAL LOW (ref 20–29)
Calcium: 9.3 mg/dL (ref 8.7–10.3)
Chloride: 90 mmol/L — ABNORMAL LOW (ref 96–106)
Creatinine, Ser: 1.34 mg/dL — ABNORMAL HIGH (ref 0.57–1.00)
Globulin, Total: 2.9 g/dL (ref 1.5–4.5)
Glucose: 372 mg/dL — ABNORMAL HIGH (ref 70–99)
Potassium: 4.4 mmol/L (ref 3.5–5.2)
Sodium: 129 mmol/L — ABNORMAL LOW (ref 134–144)
Total Protein: 7 g/dL (ref 6.0–8.5)
eGFR: 45 mL/min/{1.73_m2} — ABNORMAL LOW (ref >59)

## 2022-05-16 LAB — TSH: TSH: 3.48 u[IU]/mL (ref 0.450–4.500)

## 2022-05-16 LAB — VITAMIN B12: Vitamin B-12: 343 pg/mL (ref 232–1245)

## 2022-05-16 LAB — HGB A1C W/O EAG: Hgb A1c MFr Bld: 9.1 % — ABNORMAL HIGH (ref 4.8–5.6)

## 2022-05-22 ENCOUNTER — Ambulatory Visit: Payer: Medicaid Other | Admitting: Nurse Practitioner

## 2022-05-24 ENCOUNTER — Other Ambulatory Visit: Payer: Self-pay | Admitting: Cardiovascular Disease

## 2022-05-28 ENCOUNTER — Ambulatory Visit: Payer: Medicaid Other | Admitting: Nurse Practitioner

## 2022-05-31 ENCOUNTER — Other Ambulatory Visit: Payer: Self-pay | Admitting: Nurse Practitioner

## 2022-06-05 ENCOUNTER — Ambulatory Visit (INDEPENDENT_AMBULATORY_CARE_PROVIDER_SITE_OTHER): Payer: Medicaid Other | Admitting: Nurse Practitioner

## 2022-06-05 VITALS — BP 104/58 | HR 87 | Ht 63.0 in | Wt 168.0 lb

## 2022-06-05 DIAGNOSIS — E039 Hypothyroidism, unspecified: Secondary | ICD-10-CM

## 2022-06-05 DIAGNOSIS — J449 Chronic obstructive pulmonary disease, unspecified: Secondary | ICD-10-CM | POA: Diagnosis not present

## 2022-06-05 DIAGNOSIS — K219 Gastro-esophageal reflux disease without esophagitis: Secondary | ICD-10-CM

## 2022-06-05 DIAGNOSIS — B370 Candidal stomatitis: Secondary | ICD-10-CM

## 2022-06-05 MED ORDER — OFLOXACIN 0.3 % OT SOLN: 5.0000 [drp] | Freq: Two times a day (BID) | OTIC | 0 refills | Status: DC

## 2022-06-05 MED ORDER — FLUCONAZOLE 150 MG PO TABS: 150.0000 mg | ORAL_TABLET | Freq: Every day | ORAL | 0 refills | Status: DC

## 2022-06-05 MED ORDER — BENZONATATE 100 MG PO CAPS: 100.0000 mg | ORAL_CAPSULE | Freq: Three times a day (TID) | ORAL | 1 refills | Status: DC | PRN

## 2022-06-05 NOTE — Progress Notes (Signed)
Established Patient Office Visit  Subjective:  Patient ID: Shannon Russell, female    DOB: 1960/09/02  Age: 62 y.o. MRN: [id number]  Chief Complaint  Patient presents with   Follow-up    1 week follow up    1 week follow up from COPD exacerbation.  Patient reports cough is still productive colored yellow and green.  Prescription cough medicine hasn't worked.  Will consider tessalon perles.  Has thrush to the roof of mouth.    No other concerns at this time.   Past Medical History:  Diagnosis Date   Arthritis    Asthma    Cancer (HCC)    COPD (chronic obstructive pulmonary disease) (HCC)    Depression    Diabetes mellitus without complication (HCC)    H/O blood clots    PAD (peripheral artery disease) (HCC)    Thyroid disease     Past Surgical History:  Procedure Laterality Date   Bypass right leg  2015   CHOLECYSTECTOMY  1981   LOWER EXTREMITY ANGIOGRAPHY Right 02/20/2022   Procedure: Lower Extremity Angiography;  Surgeon: Renford Dills, MD;  Location: [facility];  Service: Cardiovascular;  Laterality: Right;   Mesh implant right leg  2015   THYROIDECTOMY  2013   TUBAL LIGATION  1993    Social History   Socioeconomic History   Marital status: Legally Separated    Spouse name: Not on file   Number of children: Not on file   Years of education: Not on file   Highest education level: Not on file  Occupational History   Not on file  Tobacco Use   Smoking status: Every Day    Packs/day: 0.50    Years: 45.00    Additional pack years: 0.00    Total pack years: 22.50    Types: Cigarettes   Smokeless tobacco: Never   Tobacco comments:    6 ciggs a day.  Vaping Use   Vaping Use: Never used  Substance and Sexual Activity   Alcohol use: Not Currently   Drug use: Not Currently   Sexual activity: Not on file  Other Topics Concern   Not on file  Social History Narrative   Not on file   Social Determinants of Health   Financial Resource Strain:  Not on file  Food Insecurity: Not on file  Transportation Needs: Not on file  Physical Activity: Not on file  Stress: Not on file  Social Connections: Not on file  Intimate Partner Violence: Not on file    Family History  Problem Relation Age of Onset   Heart failure Mother    Depression Mother    Peripheral Artery Disease Sister    Cancer Sister    Clotting disorder Sister    Depression Sister    Thyroid disease Sister     Allergies  Allergen Reactions   Advair Diskus [Fluticasone-Salmeterol] Hives   Iodine Hives and Rash    Other reaction(s): Skin Rashes, Hives  Pt states she need premedication for contrast exposure    Review of Systems  Constitutional: Negative.   HENT: Negative.    Eyes: Negative.   Respiratory:  Positive for cough and shortness of breath.   Cardiovascular: Negative.   Gastrointestinal: Negative.   Genitourinary: Negative.   Musculoskeletal: Negative.   Skin: Negative.   Neurological: Negative.   Endo/Heme/Allergies: Negative.   Psychiatric/Behavioral: Negative.         Objective:   BP (!) 104/58   Pulse 87  Ht 5\' 3"  (1.6 m)   Wt 168 lb (76.2 kg)   SpO2 97%   BMI 29.76 kg/m   Vitals:   06/05/22 0842  BP: (!) 104/58  Pulse: 87  Height: 5\' 3"  (1.6 m)  Weight: 168 lb (76.2 kg)  SpO2: 97%  BMI (Calculated): 29.77    Physical Exam Vitals and nursing note reviewed.  Constitutional:      Appearance: Normal appearance.  HENT:     Head: Normocephalic.     Nose: Nose normal.     Mouth/Throat:     Mouth: Mucous membranes are moist.  Eyes:     Pupils: Pupils are equal, round, and reactive to light.  Cardiovascular:     Rate and Rhythm: Normal rate and regular rhythm.  Abdominal:     General: Bowel sounds are normal.     Palpations: Abdomen is soft.  Musculoskeletal:        General: Normal range of motion.     Cervical back: Normal range of motion and neck supple.  Skin:    General: Skin is warm and dry.  Neurological:      Mental Status: She is alert and oriented to person, place, and time.  Psychiatric:        Mood and Affect: Mood normal.        Behavior: Behavior normal.      No results found for any visits on 06/05/22.  Recent Results (from the past 2160 hour(s))  VAS [place] ABI WITH/WO TBI     Status: None   Collection Time: 03/12/22  3:23 PM  Result Value Ref Range   Right ABI .70    Left ABI 1.03   CBC with Differential/Platelet     Status: Abnormal   Collection Time: 05/15/22 10:37 AM  Result Value Ref Range   WBC 11.4 (H) 3.4 - 10.8 x10E3/uL   RBC 4.05 3.77 - 5.28 x10E6/uL   Hemoglobin 12.2 11.1 - 15.9 g/dL   Hematocrit 16.1 09.6 - 46.6 %   MCV 91 79 - 97 fL   MCH 30.1 26.6 - 33.0 pg   MCHC 33.1 31.5 - 35.7 g/dL   RDW 04.5 40.9 - 81.1 %   Platelets 262 150 - 450 x10E3/uL   Neutrophils 88 Not Estab. %   Lymphs 8 Not Estab. %   Monocytes 3 Not Estab. %   Eos 0 Not Estab. %   Basos 0 Not Estab. %   Neutrophils Absolute 9.9 (H) 1.4 - 7.0 x10E3/uL   Lymphocytes Absolute 0.9 0.7 - 3.1 x10E3/uL   Monocytes Absolute 0.4 0.1 - 0.9 x10E3/uL   EOS (ABSOLUTE) 0.0 0.0 - 0.4 x10E3/uL   Basophils Absolute 0.0 0.0 - 0.2 x10E3/uL   Immature Granulocytes 1 Not Estab. %   Immature Grans (Abs) 0.1 0.0 - 0.1 x10E3/uL  Comprehensive metabolic panel     Status: Abnormal   Collection Time: 05/15/22 10:37 AM  Result Value Ref Range   Glucose 372 (H) 70 - 99 mg/dL   BUN 21 8 - 27 mg/dL   Creatinine, Ser 9.14 (H) 0.57 - 1.00 mg/dL   eGFR 45 (L) >78 GN/FAO/1.30   BUN/Creatinine Ratio 16 12 - 28   Sodium 129 (L) 134 - 144 mmol/L   Potassium 4.4 3.5 - 5.2 mmol/L   Chloride 90 (L) 96 - 106 mmol/L   CO2 19 (L) 20 - 29 mmol/L   Calcium 9.3 8.7 - 10.3 mg/dL   Total Protein 7.0 6.0 - 8.5 g/dL  Albumin 4.1 3.9 - 4.9 g/dL   Globulin, Total 2.9 1.5 - 4.5 g/dL   Albumin/Globulin Ratio 1.4 1.2 - 2.2   Bilirubin Total 0.2 0.0 - 1.2 mg/dL   Alkaline Phosphatase 133 (H) 44 - 121 IU/L   AST 14 0 - 40 IU/L   ALT  17 0 - 32 IU/L  Lipid Panel w/o Chol/HDL Ratio     Status: Abnormal   Collection Time: 05/15/22 10:37 AM  Result Value Ref Range   Cholesterol, Total 112 100 - 199 mg/dL   Triglycerides 161 (H) 0 - 149 mg/dL   HDL 30 (L) >09 mg/dL   VLDL Cholesterol Cal 31 5 - 40 mg/dL   LDL Chol Calc (NIH) 51 0 - 99 mg/dL  Hgb U0A w/o eAG     Status: Abnormal   Collection Time: 05/15/22 10:37 AM  Result Value Ref Range   Hgb A1c MFr Bld 9.1 (H) 4.8 - 5.6 %    Comment:          Prediabetes: 5.7 - 6.4          Diabetes: >6.4          Glycemic control for adults with diabetes: <7.0   TSH     Status: None   Collection Time: 05/15/22 10:37 AM  Result Value Ref Range   TSH 3.480 0.450 - 4.500 uIU/mL  Vitamin B12     Status: None   Collection Time: 05/15/22 10:37 AM  Result Value Ref Range   Vitamin B-12 343 232 - 1,245 pg/mL      Assessment & Plan:  1) Follow up appt in 3 weeks 2) Fluconazole and tessalon perles 3 Cutting back on sugar, A1c 9.1%  Problem List Items Addressed This Visit       Respiratory   COPD (chronic obstructive pulmonary disease) (HCC) - Primary   Relevant Medications   benzonatate (TESSALON PERLES) 100 MG capsule     Digestive   GERD (gastroesophageal reflux disease)   Candidiasis of mouth   Relevant Medications   fluconazole (DIFLUCAN) 150 MG tablet     Endocrine   Hypothyroidism (Chronic)    No follow-ups on file.   Total time spent: 35 minutes  Orson Eva, NP  06/05/2022   This document may have been prepared by [facility] Voice Recognition software and as such may include unintentional dictation errors.

## 2022-06-06 ENCOUNTER — Other Ambulatory Visit: Payer: Self-pay

## 2022-06-06 DIAGNOSIS — I10 Essential (primary) hypertension: Secondary | ICD-10-CM

## 2022-06-06 MED ORDER — LISINOPRIL 5 MG PO TABS
5.0000 mg | ORAL_TABLET | Freq: Every day | ORAL | 0 refills | Status: DC
Start: 2022-06-06 — End: 2022-08-30

## 2022-06-13 ENCOUNTER — Other Ambulatory Visit (INDEPENDENT_AMBULATORY_CARE_PROVIDER_SITE_OTHER): Payer: Self-pay | Admitting: Vascular Surgery

## 2022-06-13 DIAGNOSIS — I70219 Atherosclerosis of native arteries of extremities with intermittent claudication, unspecified extremity: Secondary | ICD-10-CM

## 2022-06-18 ENCOUNTER — Encounter (INDEPENDENT_AMBULATORY_CARE_PROVIDER_SITE_OTHER): Payer: Medicaid Other

## 2022-06-18 ENCOUNTER — Ambulatory Visit (INDEPENDENT_AMBULATORY_CARE_PROVIDER_SITE_OTHER): Payer: Medicaid Other | Admitting: Vascular Surgery

## 2022-06-25 ENCOUNTER — Ambulatory Visit: Payer: Medicaid Other | Admitting: Nurse Practitioner

## 2022-07-02 ENCOUNTER — Other Ambulatory Visit: Payer: Self-pay

## 2022-07-02 MED ORDER — METFORMIN HCL 1000 MG PO TABS: 1000.0000 mg | ORAL_TABLET | Freq: Every morning | ORAL | 3 refills | Status: DC

## 2022-07-04 ENCOUNTER — Other Ambulatory Visit: Payer: Self-pay | Admitting: Cardiovascular Disease

## 2022-07-16 ENCOUNTER — Telehealth: Payer: Self-pay | Admitting: General Practice

## 2022-07-16 NOTE — Telephone Encounter (Signed)
Patient left VM wanting a referral to ortho for her hip. States that she is having difficulty walking now.

## 2022-07-17 ENCOUNTER — Other Ambulatory Visit: Payer: Self-pay | Admitting: Internal Medicine

## 2022-07-17 DIAGNOSIS — M25552 Pain in left hip: Secondary | ICD-10-CM

## 2022-08-18 ENCOUNTER — Other Ambulatory Visit: Payer: Self-pay | Admitting: Cardiovascular Disease

## 2022-08-21 ENCOUNTER — Ambulatory Visit: Payer: Medicaid Other | Admitting: Cardiovascular Disease

## 2022-08-27 ENCOUNTER — Other Ambulatory Visit: Payer: Self-pay

## 2022-08-27 MED ORDER — LEVOTHYROXINE SODIUM 100 MCG PO TABS: 100.0000 ug | ORAL_TABLET | Freq: Every day | ORAL | 0 refills | Status: DC

## 2022-08-30 ENCOUNTER — Other Ambulatory Visit: Payer: Self-pay | Admitting: Cardiovascular Disease

## 2022-08-30 DIAGNOSIS — I10 Essential (primary) hypertension: Secondary | ICD-10-CM

## 2022-09-03 ENCOUNTER — Other Ambulatory Visit: Payer: Self-pay

## 2022-09-03 MED ORDER — PREGABALIN 75 MG PO CAPS: 75.0000 mg | ORAL_CAPSULE | Freq: Three times a day (TID) | ORAL | 3 refills | Status: DC

## 2022-09-06 ENCOUNTER — Other Ambulatory Visit: Payer: Medicaid Other

## 2022-09-06 DIAGNOSIS — E039 Hypothyroidism, unspecified: Secondary | ICD-10-CM

## 2022-09-06 DIAGNOSIS — I10 Essential (primary) hypertension: Secondary | ICD-10-CM

## 2022-09-06 DIAGNOSIS — E782 Mixed hyperlipidemia: Secondary | ICD-10-CM

## 2022-09-06 DIAGNOSIS — E1151 Type 2 diabetes mellitus with diabetic peripheral angiopathy without gangrene: Secondary | ICD-10-CM

## 2022-09-07 LAB — CBC WITH DIFFERENTIAL/PLATELET
Basophils Absolute: 0.1 10*3/uL (ref 0.0–0.2)
Basos: 1 %
EOS (ABSOLUTE): 0.2 10*3/uL (ref 0.0–0.4)
Eos: 1 %
Hematocrit: 39 % (ref 34.0–46.6)
Hemoglobin: 12.9 g/dL (ref 11.1–15.9)
Immature Grans (Abs): 0.1 10*3/uL (ref 0.0–0.1)
Immature Granulocytes: 1 %
Lymphocytes Absolute: 3.1 10*3/uL (ref 0.7–3.1)
Lymphs: 29 %
MCH: 29.5 pg (ref 26.6–33.0)
MCHC: 33.1 g/dL (ref 31.5–35.7)
MCV: 89 fL (ref 79–97)
Monocytes Absolute: 0.6 10*3/uL (ref 0.1–0.9)
Monocytes: 6 %
Neutrophils Absolute: 6.5 10*3/uL (ref 1.4–7.0)
Neutrophils: 62 %
Platelets: 281 10*3/uL (ref 150–450)
RBC: 4.37 x10E6/uL (ref 3.77–5.28)
RDW: 15 % (ref 11.7–15.4)
WBC: 10.5 10*3/uL (ref 3.4–10.8)

## 2022-09-07 LAB — CMP14+EGFR
ALT: 20 IU/L (ref 0–32)
AST: 13 IU/L (ref 0–40)
Albumin: 4.3 g/dL (ref 3.9–4.9)
Alkaline Phosphatase: 129 IU/L — ABNORMAL HIGH (ref 44–121)
BUN/Creatinine Ratio: 24 (ref 12–28)
BUN: 32 mg/dL — ABNORMAL HIGH (ref 8–27)
Bilirubin Total: <0.2 mg/dL (ref 0.0–1.2)
CO2: 21 mmol/L (ref 20–29)
Calcium: 9.4 mg/dL (ref 8.7–10.3)
Chloride: 103 mmol/L (ref 96–106)
Creatinine, Ser: 1.33 mg/dL — ABNORMAL HIGH (ref 0.57–1.00)
Globulin, Total: 2.5 g/dL (ref 1.5–4.5)
Glucose: 139 mg/dL — ABNORMAL HIGH (ref 70–99)
Potassium: 5 mmol/L (ref 3.5–5.2)
Sodium: 137 mmol/L (ref 134–144)
Total Protein: 6.8 g/dL (ref 6.0–8.5)
eGFR: 46 mL/min/{1.73_m2} — ABNORMAL LOW (ref >59)

## 2022-09-07 LAB — LIPID PANEL
Chol/HDL Ratio: 4.2 ratio (ref 0.0–4.4)
Cholesterol, Total: 164 mg/dL (ref 100–199)
HDL: 39 mg/dL — ABNORMAL LOW (ref >39)
LDL Chol Calc (NIH): 85 mg/dL (ref 0–99)
Triglycerides: 237 mg/dL — ABNORMAL HIGH (ref 0–149)
VLDL Cholesterol Cal: 40 mg/dL (ref 5–40)

## 2022-09-07 LAB — HEMOGLOBIN A1C
Est. average glucose Bld gHb Est-mCnc: 220 mg/dL
Hgb A1c MFr Bld: 9.3 % — ABNORMAL HIGH (ref 4.8–5.6)

## 2022-09-07 LAB — TSH: TSH: 7.16 u[IU]/mL — ABNORMAL HIGH (ref 0.450–4.500)

## 2022-09-11 ENCOUNTER — Ambulatory Visit (INDEPENDENT_AMBULATORY_CARE_PROVIDER_SITE_OTHER): Payer: Medicaid Other | Admitting: Cardiology

## 2022-09-11 ENCOUNTER — Encounter: Payer: Self-pay | Admitting: Cardiology

## 2022-09-11 VITALS — BP 120/70 | HR 92 | Ht 63.0 in | Wt 170.4 lb

## 2022-09-11 DIAGNOSIS — E66811 Obesity, class 1: Secondary | ICD-10-CM

## 2022-09-11 DIAGNOSIS — E039 Hypothyroidism, unspecified: Secondary | ICD-10-CM | POA: Diagnosis not present

## 2022-09-11 DIAGNOSIS — E1151 Type 2 diabetes mellitus with diabetic peripheral angiopathy without gangrene: Secondary | ICD-10-CM | POA: Diagnosis not present

## 2022-09-11 DIAGNOSIS — J449 Chronic obstructive pulmonary disease, unspecified: Secondary | ICD-10-CM

## 2022-09-11 DIAGNOSIS — E782 Mixed hyperlipidemia: Secondary | ICD-10-CM | POA: Diagnosis not present

## 2022-09-11 DIAGNOSIS — E669 Obesity, unspecified: Secondary | ICD-10-CM

## 2022-09-11 DIAGNOSIS — F17208 Nicotine dependence, unspecified, with other nicotine-induced disorders: Secondary | ICD-10-CM | POA: Diagnosis not present

## 2022-09-11 MED ORDER — METFORMIN HCL 1000 MG PO TABS: 1000.0000 mg | ORAL_TABLET | Freq: Every morning | ORAL | 3 refills | Status: DC

## 2022-09-11 MED ORDER — ATORVASTATIN CALCIUM 80 MG PO TABS: 80.0000 mg | ORAL_TABLET | Freq: Every day | ORAL | 3 refills | Status: DC

## 2022-09-11 MED ORDER — ACCU-CHEK SOFTCLIX LANCETS MISC: 12 refills | Status: AC

## 2022-09-11 MED ORDER — LANTUS SOLOSTAR 100 UNIT/ML ~~LOC~~ SOPN: 18.0000 [IU] | PEN_INJECTOR | Freq: Every day | SUBCUTANEOUS | 6 refills | Status: DC

## 2022-09-11 MED ORDER — BD PEN NEEDLE MINI U/F 31G X 5 MM MISC: 0 refills | Status: DC

## 2022-09-11 MED ORDER — CLONIDINE HCL 0.1 MG PO TABS: 0.1000 mg | ORAL_TABLET | Freq: Every day | ORAL | 3 refills | Status: DC

## 2022-09-11 MED ORDER — GLUCOSE BLOOD VI STRP: ORAL_STRIP | 12 refills | Status: AC

## 2022-09-11 NOTE — Progress Notes (Signed)
Established Patient Office Visit  Subjective:  Patient ID: Shannon Russell, female    DOB: March 07, 1960  Age: 62 y.o. MRN: [id number]  Chief Complaint  Patient presents with   Follow-up    3 month follow up, discuss lab results.    Patient in office for 3 month follow, discuss recent lab work. Patient doing well. No new complaints today. Discussed recent lab work and importance of following a strict diabetic diet and exercise. Hgb A1c continues to be elevated. Increase Lantus to 18 units at bedtime. Continue all other medications.  TSH elevated. Recheck in 1 month.  Patient requesting a referral to pulmonary for her COPD. Referral placed.     No other concerns at this time.   Past Medical History:  Diagnosis Date   Arthritis    Asthma    Cancer (HCC)    COPD (chronic obstructive pulmonary disease) (HCC)    Depression    Diabetes mellitus without complication (HCC)    H/O blood clots    PAD (peripheral artery disease) (HCC)    Thyroid disease     Past Surgical History:  Procedure Laterality Date   Bypass right leg  2015   CHOLECYSTECTOMY  1981   LOWER EXTREMITY ANGIOGRAPHY Right 02/20/2022   Procedure: Lower Extremity Angiography;  Surgeon: Renford Dills, MD;  Location: [facility];  Service: Cardiovascular;  Laterality: Right;   Mesh implant right leg  2015   THYROIDECTOMY  2013   TUBAL LIGATION  1993    Social History   Socioeconomic History   Marital status: Legally Separated    Spouse name: Not on file   Number of children: Not on file   Years of education: Not on file   Highest education level: Not on file  Occupational History   Not on file  Tobacco Use   Smoking status: Every Day    Current packs/day: 0.50    Average packs/day: 0.5 packs/day for 45.0 years (22.5 ttl pk-yrs)    Types: Cigarettes   Smokeless tobacco: Never   Tobacco comments:    6 ciggs a day.  Vaping Use   Vaping status: Never Used  Substance and Sexual Activity    Alcohol use: Not Currently   Drug use: Not Currently   Sexual activity: Not on file  Other Topics Concern   Not on file  Social History Narrative   Not on file   Social Determinants of Health   Financial Resource Strain: Not on file  Food Insecurity: Not on file  Transportation Needs: Not on file  Physical Activity: Not on file  Stress: Not on file  Social Connections: Not on file  Intimate Partner Violence: Not on file    Family History  Problem Relation Age of Onset   Heart failure Mother    Depression Mother    Peripheral Artery Disease Sister    Cancer Sister    Clotting disorder Sister    Depression Sister    Thyroid disease Sister     Allergies  Allergen Reactions   Advair Diskus [Fluticasone-Salmeterol] Hives   Iodine Hives and Rash    Other reaction(s): Skin Rashes, Hives  Pt states she need premedication for contrast exposure    Review of Systems  Constitutional: Negative.   HENT: Negative.    Eyes: Negative.   Respiratory: Negative.  Negative for shortness of breath.   Cardiovascular: Negative.  Negative for chest pain.  Gastrointestinal: Negative.  Negative for abdominal pain, constipation and diarrhea.  Genitourinary: Negative.   Musculoskeletal:  Negative for joint pain and myalgias.  Skin: Negative.   Neurological: Negative.  Negative for dizziness and headaches.  Endo/Heme/Allergies: Negative.   All other systems reviewed and are negative.      Objective:   BP 120/70   Pulse 92   Ht 5\' 3"  (1.6 m)   Wt 170 lb 6.4 oz (77.3 kg)   SpO2 97%   BMI 30.19 kg/m   Vitals:   09/11/22 1053  BP: 120/70  Pulse: 92  Height: 5\' 3"  (1.6 m)  Weight: 170 lb 6.4 oz (77.3 kg)  SpO2: 97%  BMI (Calculated): 30.19    Physical Exam Vitals and nursing note reviewed.  Constitutional:      Appearance: Normal appearance. She is normal weight.  HENT:     Head: Normocephalic and atraumatic.     Nose: Nose normal.     Mouth/Throat:     Mouth: Mucous  membranes are moist.  Eyes:     Extraocular Movements: Extraocular movements intact.     Conjunctiva/sclera: Conjunctivae normal.     Pupils: Pupils are equal, round, and reactive to light.  Cardiovascular:     Rate and Rhythm: Normal rate and regular rhythm.     Pulses: Normal pulses.     Heart sounds: Normal heart sounds.  Pulmonary:     Effort: Pulmonary effort is normal.     Breath sounds: Normal breath sounds.  Abdominal:     General: Abdomen is flat. Bowel sounds are normal.     Palpations: Abdomen is soft.  Musculoskeletal:        General: Normal range of motion.     Cervical back: Normal range of motion.  Skin:    General: Skin is warm and dry.  Neurological:     General: No focal deficit present.     Mental Status: She is alert and oriented to person, place, and time.  Psychiatric:        Mood and Affect: Mood normal.        Behavior: Behavior normal.        Thought Content: Thought content normal.        Judgment: Judgment normal.      No results found for any visits on 09/11/22.  Recent Results (from the past 2160 hour(s))  Hemoglobin A1c     Status: Abnormal   Collection Time: 09/06/22  1:24 PM  Result Value Ref Range   Hgb A1c MFr Bld 9.3 (H) 4.8 - 5.6 %    Comment:          Prediabetes: 5.7 - 6.4          Diabetes: >6.4          Glycemic control for adults with diabetes: <7.0    Est. average glucose Bld gHb Est-mCnc 220 mg/dL  TSH     Status: Abnormal   Collection Time: 09/06/22  1:24 PM  Result Value Ref Range   TSH 7.160 (H) 0.450 - 4.500 uIU/mL  CMP14+EGFR     Status: Abnormal   Collection Time: 09/06/22  1:24 PM  Result Value Ref Range   Glucose 139 (H) 70 - 99 mg/dL   BUN 32 (H) 8 - 27 mg/dL   Creatinine, Ser 1.61 (H) 0.57 - 1.00 mg/dL   eGFR 46 (L) >09 UE/AVW/0.98   BUN/Creatinine Ratio 24 12 - 28   Sodium 137 134 - 144 mmol/L   Potassium 5.0 3.5 - 5.2 mmol/L   Chloride  103 96 - 106 mmol/L   CO2 21 20 - 29 mmol/L   Calcium 9.4 8.7 - 10.3  mg/dL   Total Protein 6.8 6.0 - 8.5 g/dL   Albumin 4.3 3.9 - 4.9 g/dL   Globulin, Total 2.5 1.5 - 4.5 g/dL   Bilirubin Total <1.6 0.0 - 1.2 mg/dL   Alkaline Phosphatase 129 (H) 44 - 121 IU/L   AST 13 0 - 40 IU/L   ALT 20 0 - 32 IU/L  Lipid panel     Status: Abnormal   Collection Time: 09/06/22  1:24 PM  Result Value Ref Range   Cholesterol, Total 164 100 - 199 mg/dL   Triglycerides 109 (H) 0 - 149 mg/dL   HDL 39 (L) >60 mg/dL   VLDL Cholesterol Cal 40 5 - 40 mg/dL   LDL Chol Calc (NIH) 85 0 - 99 mg/dL   Chol/HDL Ratio 4.2 0.0 - 4.4 ratio    Comment:                                   T. Chol/HDL Ratio                                             Men  Women                               1/2 Avg.Risk  3.4    3.3                                   Avg.Risk  5.0    4.4                                2X Avg.Risk  9.6    7.1                                3X Avg.Risk 23.4   11.0   CBC with Diff     Status: None   Collection Time: 09/06/22  1:24 PM  Result Value Ref Range   WBC 10.5 3.4 - 10.8 x10E3/uL   RBC 4.37 3.77 - 5.28 x10E6/uL   Hemoglobin 12.9 11.1 - 15.9 g/dL   Hematocrit 45.4 09.8 - 46.6 %   MCV 89 79 - 97 fL   MCH 29.5 26.6 - 33.0 pg   MCHC 33.1 31.5 - 35.7 g/dL   RDW 11.9 14.7 - 82.9 %   Platelets 281 150 - 450 x10E3/uL   Neutrophils 62 Not Estab. %   Lymphs 29 Not Estab. %   Monocytes 6 Not Estab. %   Eos 1 Not Estab. %   Basos 1 Not Estab. %   Neutrophils Absolute 6.5 1.4 - 7.0 x10E3/uL   Lymphocytes Absolute 3.1 0.7 - 3.1 x10E3/uL   Monocytes Absolute 0.6 0.1 - 0.9 x10E3/uL   EOS (ABSOLUTE) 0.2 0.0 - 0.4 x10E3/uL   Basophils Absolute 0.1 0.0 - 0.2 x10E3/uL   Immature Granulocytes 1 Not Estab. %   Immature Grans (Abs) 0.1 0.0 - 0.1 x10E3/uL  Assessment & Plan:  Increase Lantus to 18 units at bedtime. Recheck TSH in 1 month.  Follow a strict diabetic diet and exercise.  Referral sent for pulmonary.  Problem List Items Addressed This Visit        Cardiovascular and Mediastinum   Type 2 diabetes mellitus with diabetic peripheral angiopathy without gangrene (HCC) - Primary   Relevant Medications   cloNIDine (CATAPRES) 0.1 MG tablet   atorvastatin (LIPITOR) 80 MG tablet   metFORMIN (GLUCOPHAGE) 1000 MG tablet   insulin glargine (LANTUS SOLOSTAR) 100 UNIT/ML Solostar Pen     Respiratory   COPD (chronic obstructive pulmonary disease) (HCC)   Relevant Orders   Ambulatory referral to Pulmonology     Endocrine   Hypothyroidism (Chronic)   Relevant Orders   TSH     Other   Nicotine dependence   Hyperlipidemia   Relevant Medications   cloNIDine (CATAPRES) 0.1 MG tablet   atorvastatin (LIPITOR) 80 MG tablet   Obesity, Class I, BMI 30-34.9    Return in about 3 months (around 12/11/2022) for with fasting labs prior.   Total time spent: 25 minutes  [employer], NP  09/11/2022   This document may have been prepared by Dragon Voice Recognition software and as such may include unintentional dictation errors.

## 2022-09-21 ENCOUNTER — Other Ambulatory Visit: Payer: Self-pay

## 2022-09-24 MED ORDER — MONTELUKAST SODIUM 10 MG PO TABS: 10.0000 mg | ORAL_TABLET | Freq: Every day | ORAL | 2 refills | Status: DC

## 2022-10-01 ENCOUNTER — Other Ambulatory Visit: Payer: Self-pay | Admitting: Cardiovascular Disease

## 2022-10-10 ENCOUNTER — Encounter: Payer: Self-pay | Admitting: Internal Medicine

## 2022-10-10 ENCOUNTER — Institutional Professional Consult (permissible substitution): Payer: Medicaid Other | Admitting: Internal Medicine

## 2022-10-10 NOTE — Progress Notes (Deleted)
Shannon Russell, female    DOB: 07-Sep-1960    MRN: [id number]   Brief patient profile:  ***  yo*** *** referred to pulmonary clinic in [place]  10/10/2022 by *** for ***      History of Present Illness  10/10/2022  Pulmonary/ 1st office eval/ Sherene Sires / [place] [facility]  No chief complaint on file.    Dyspnea:  *** Cough: *** Sleep: *** SABA use: *** 02: *** Lung cancer screen: ***   Outpatient Medications Prior to Visit  Medication Sig Dispense Refill   Accu-Chek Softclix Lancets lancets Use as instructed 100 each 12   albuterol (PROVENTIL HFA;VENTOLIN HFA) 108 (90 Base) MCG/ACT inhaler Inhale 2 puffs into the lungs every 6 (six) hours as needed for wheezing.     aspirin EC 81 MG tablet Take 81 mg by mouth daily. Swallow whole.     atorvastatin (LIPITOR) 80 MG tablet Take 1 tablet (80 mg total) by mouth daily. 90 tablet 3   baclofen (LIORESAL) 10 MG tablet Take 1 tablet (10 mg total) by mouth every 12 (twelve) hours as needed for muscle spasms. 180 tablet 3   budesonide (PULMICORT) 0.5 MG/2ML nebulizer solution Take 4 mLs (1 mg total) by nebulization 2 (two) times daily. 240 mL 2   Cholecalciferol 1.25 MG (50000 UT) capsule Vitamin D3 1.25 MG (50000 UT) Oral Capsule QTY: 0 capsule Days: 0 Refills: 0  Written: 02/09/20 Patient Instructions: qaw 12 capsule 3   cloNIDine (CATAPRES) 0.1 MG tablet Take 1 tablet (0.1 mg total) by mouth at bedtime. 90 tablet 3   clopidogrel (PLAVIX) 75 MG tablet Take 1 tablet by mouth once daily 90 tablet 0   fexofenadine (ALLEGRA) 180 MG tablet Take 180 mg by mouth every morning.     fluconazole (DIFLUCAN) 150 MG tablet Take 1 tablet (150 mg total) by mouth daily. 5 tablet 0   furosemide (LASIX) 40 MG tablet Take 1 tablet by mouth once daily 90 tablet 0   glucose blood test strip Use as instructed 100 each 12   insulin glargine (LANTUS SOLOSTAR) 100 UNIT/ML Solostar Pen Inject 18 Units into the skin at bedtime. 15 mL 6   Insulin Pen Needle (B-D UF III  MINI PEN NEEDLES) 31G X 5 MM MISC As directed 100 each 0   ipratropium-albuterol (DUONEB) 0.5-2.5 (3) MG/3ML SOLN Take 3 mLs by nebulization every 6 (six) hours as needed. 360 mL 6   levothyroxine (SYNTHROID) 100 MCG tablet Take 1 tablet (100 mcg total) by mouth daily before breakfast. 90 tablet 0   lisinopril (ZESTRIL) 5 MG tablet Take 1 tablet by mouth once daily 90 tablet 0   metFORMIN (GLUCOPHAGE) 1000 MG tablet Take 1 tablet (1,000 mg total) by mouth every morning. 90 tablet 3   montelukast (SINGULAIR) 10 MG tablet Take 1 tablet (10 mg total) by mouth at bedtime. 90 tablet 2   nicotine (NICODERM CQ - DOSED IN MG/24 HOURS) 14 mg/24hr patch Place 1 patch (14 mg total) onto the skin daily. 28 patch 0   ofloxacin (FLOXIN) 0.3 % OTIC solution Place 5 drops into both ears 2 (two) times daily. 5 mL 0   pantoprazole (PROTONIX) 40 MG tablet Take 1 tablet (40 mg total) by mouth 2 (two) times daily. 60 tablet 3   pregabalin (LYRICA) 75 MG capsule Take 1 capsule (75 mg total) by mouth 3 (three) times daily. 90 capsule 3   SPIRIVA HANDIHALER 18 MCG inhalation capsule daily.     temazepam (RESTORIL)  15 MG capsule Take 15 mg by mouth at bedtime as needed.     TRELEGY ELLIPTA 100-62.5-25 MCG/ACT AEPB SMARTSIG:1 Puff(s) Via Inhaler Every Morning     No facility-administered medications prior to visit.    Past Medical History:  Diagnosis Date   Arthritis    Asthma    Cancer (HCC)    COPD (chronic obstructive pulmonary disease) (HCC)    Depression    Diabetes mellitus without complication (HCC)    H/O blood clots    PAD (peripheral artery disease) (HCC)    Thyroid disease       Objective:     There were no vitals taken for this visit.         Assessment   No problem-specific Assessment & Plan notes found for this encounter.     Sandrea Hughs, MD 10/10/2022

## 2022-10-25 ENCOUNTER — Other Ambulatory Visit: Payer: Self-pay

## 2022-10-25 ENCOUNTER — Ambulatory Visit (HOSPITAL_COMMUNITY): Payer: Medicaid Other | Attending: Physician Assistant | Admitting: Physical Therapy

## 2022-10-25 DIAGNOSIS — M6281 Muscle weakness (generalized): Secondary | ICD-10-CM | POA: Diagnosis present

## 2022-10-25 DIAGNOSIS — M5416 Radiculopathy, lumbar region: Secondary | ICD-10-CM

## 2022-10-25 DIAGNOSIS — R262 Difficulty in walking, not elsewhere classified: Secondary | ICD-10-CM

## 2022-10-25 NOTE — Therapy (Signed)
OUTPATIENT PHYSICAL THERAPY THORACOLUMBAR EVALUATION   Patient Name: Shannon Russell MRN: [id number] DOB:Mar 22, 1960, 62 y.o., female Today's Date: 10/25/2022  END OF SESSION:  PT End of Session - 10/25/22 0928     Visit Number 1    Number of Visits 12    Date for PT Re-Evaluation 12/06/22    Authorization Type [employer] put in at evaluation    PT Start Time 0855   pt late   PT Stop Time 0930    PT Time Calculation (min) 35 min    Activity Tolerance Patient limited by pain    Behavior During Therapy [facility] for tasks assessed/performed             Past Medical History:  Diagnosis Date   Arthritis    Asthma    Cancer (HCC)    COPD (chronic obstructive pulmonary disease) (HCC)    Depression    Diabetes mellitus without complication (HCC)    H/O blood clots    PAD (peripheral artery disease) (HCC)    Thyroid disease    Past Surgical History:  Procedure Laterality Date   Bypass right leg  2015   CHOLECYSTECTOMY  1981   LOWER EXTREMITY ANGIOGRAPHY Right 02/20/2022   Procedure: Lower Extremity Angiography;  Surgeon: Renford Dills, MD;  Location: [facility] INVASIVE CV LAB;  Service: Cardiovascular;  Laterality: Right;   Mesh implant right leg  2015   THYROIDECTOMY  2013   TUBAL LIGATION  1993   Patient Active Problem List   Diagnosis Date Noted   Candidiasis of mouth 06/05/2022   Sinusitis 05/14/2022   Atherosclerosis of native arteries of extremity with intermittent claudication (HCC) 02/28/2022   Lymphedema 02/28/2022   Diabetes (HCC) 02/28/2022   Lactic acidosis 07/28/2021   COPD exacerbation (HCC) 07/27/2021   Acute renal failure superimposed on stage 2 chronic kidney disease (HCC) 07/27/2021   Obesity, Class I, BMI 30-34.9 07/27/2021   Hypothyroidism 07/27/2021   Frequent PVCs    Syncope 06/07/2020   Hypotension 06/07/2020   Hypomagnesemia 06/07/2020   Community acquired pneumonia 05/31/2020   Chronic respiratory failure with hypoxia (HCC) 05/31/2020    Syncope and collapse 05/31/2020   GERD (gastroesophageal reflux disease) 03/23/2020   Hyperlipidemia 03/23/2020   COPD (chronic obstructive pulmonary disease) (HCC) 04/20/2018   Nicotine dependence 04/20/2018   Type 2 diabetes mellitus with diabetic peripheral angiopathy without gangrene (HCC) 04/20/2018   PAD (peripheral artery disease) (HCC) 04/20/2018    PCP: Cam Hai, PA-C  REFERRING PROVIDER: Shelly Bombard  REFERRING DIAG: M54.16 (ICD-10-CM) - Lumbar radiculopathy  Rationale for Evaluation and Treatment: Rehabilitation  THERAPY DIAG:  Radiculopathy, lumbar region  Muscle weakness (generalized)  ONSET DATE: chronic a year and a half  SUBJECTIVE:  SUBJECTIVE STATEMENT: Pt states that she has been hurting for a year and a half.  She has had multiple x-rays.  She received a shot for bursitis in her LT hip which has helped.  She is having difficulty walking.  Able to sit all day long, stand 2 minutes, able to walk for 2 minutes.    PERTINENT HISTORY:  PAD, OA   PAIN:  Are you having pain? Yes: NPRS scale: 8/10; worst is a 10/10, 4 Pain location:  Rt hip into her groin; sometimes she will have pain in her Lt back pt also has pain going down into  her LT butt cheek.  Pain description: sharp Aggravating factors: wt bearing  Relieving factors: sitting   PRECAUTIONS: Fall  RED FLAGS: None   WEIGHT BEARING RESTRICTIONS: No  FALLS:  Has patient fallen in last 6 months? No and Yes. Number of falls 10-12  LIVING ENVIRONMENT: Lives with: lives with their son Lives in: House/apartment Stairs: Yes: External: 3 steps; on left going up, has to go up and down sideways  Has following equipment at home: None  OCCUPATION: retired  PLOF: Independent with basic ADLs  PATIENT GOALS: less  pain, to be able to stand/walk longer   NEXT MD VISIT: 6 weeks   OBJECTIVE:  Note: Objective measures were completed at Evaluation unless otherwise noted.  DIAGNOSTIC FINDINGS:  No access to x-ray      POSTURE: No Significant postural limitations  LUMBAR ROM:   AROM eval  Flexion Fingers 4" from ground pain increased coming up   Extension 11; reps no change  Right lateral flexion   Left lateral flexion   Right rotation   Left rotation    (Blank rows = not tested)  LOWER EXTREMITY ROM:     Active  Right eval Left eval  Hip flexion    Hip extension    Hip abduction    Hip adduction    Hip internal rotation    Hip external rotation    Knee flexion    Knee extension    Ankle dorsiflexion    Ankle plantarflexion    Ankle inversion    Ankle eversion     (Blank rows = not tested)  LOWER EXTREMITY MMT:    MMT Right eval Left eval  Hip flexion 5 3  Hip extension 4- 1  Hip abduction 5 1+  Hip adduction    Hip internal rotation    Hip external rotation    Knee flexion 5 3+  Knee extension 5 4+  Ankle dorsiflexion 4 3+   Ankle plantarflexion    Ankle inversion    Ankle eversion     (Blank rows = not tested)  FUNCTIONAL TESTS:  30 seconds chair stand test: 7 x no UE noted all wt on RT leg 8 is poor 14 average for sex and age  85 minute walk test: 226" Single leg stance:  RT:  3" , LT:   0"   TODAY'S TREATMENT:  DATE:  10/25/22 Eval   Sit to stand x 10 Bridge x 10    PATIENT EDUCATION:  Education details: HEP Person educated: Patient Education method: Programmer, multimedia, Verbal cues, and Handouts Education comprehension: returned demonstration  HOME EXERCISE PROGRAM: Access Code: [email or web address] URL: [email or web address] Date: 10/25/2022 Prepared by: Virgina Organ  Exercises - Supine Bridge  - 2 x daily - 7 x weekly  - 1 sets - 10 reps - 3-5 seconds  hold - Bent Knee Fallouts  - 2 x daily - 7 x weekly - 1 sets - 10 reps - 3" hold - Sit to Stand  - 2 x daily - 7 x weekly - 1 sets - 10 reps  ASSESSMENT:  CLINICAL IMPRESSION: Patient is a 62 y.o. female  who was seen today for physical therapy evaluation and treatment for lumbar radiculopathy.  Evaluation demonstrates decreased ROM, decreased strength, decreased balance, decreased activity tolerance and increased pain.  Ms. Yandow will benefit from skilled PT to address these issues and maximize her functional ability.   OBJECTIVE IMPAIRMENTS: decreased activity tolerance, decreased balance, decreased mobility, difficulty walking, decreased ROM, decreased strength, and pain.   ACTIVITY LIMITATIONS: carrying, lifting, bending, standing, squatting, stairs, and locomotion level  PARTICIPATION LIMITATIONS: meal prep, cleaning, laundry, shopping, and community activity  PERSONAL FACTORS: Fitness, Past/current experiences, and Time since onset of injury/illness/exacerbation are also affecting patient's functional outcome.   REHAB POTENTIAL: Good  CLINICAL DECISION MAKING: Evolving/moderate complexity  EVALUATION COMPLEXITY: Moderate   GOALS: Goals reviewed with patient? No  SHORT TERM GOALS: Target date: 11/15/22  Pt to be I in HEP in order to decrease her pain to no greater than a 7/10 Baseline: Goal status: INITIAL  2.  Pt strength of core and LE mm to increase 1/2 grade to allow pt to be able to rise from a couch and get in and out of a car with greater ease.  Baseline:  Goal status: INITIAL  3.  Pt to be able to walk for four minutes  Baseline:  Goal status: INITIAL   LONG TERM GOALS: Target date: 12/06/22  Pt to be I in an advanced HEP in order to decrease her pain to no greater than a 5/10 Baseline:  Goal status: INITIAL  2.  Pt strength of core and LE mm to increase 1 grade to allow pt to go up and down steps in a reciprocal manner.    Baseline:  Goal status: INITIAL  3.  PT to be able to stand/walk for 15 minutes to make a small meal. Baseline:  Goal status: INITIAL  PLAN:  PT FREQUENCY: 2x/week  PT DURATION: 6 weeks  PLANNED INTERVENTIONS: 97110-Therapeutic exercises, 97530- Therapeutic activity, 97112- Neuromuscular re-education, 97535- Self Care, and 54098- Manual therapy.  PLAN FOR NEXT SESSION: begin lumbar/thoracic excursions and stabilization exercises.  Virgina Organ, PT CLT [phone]  10/25/2022, 10:14 AM

## 2022-10-30 ENCOUNTER — Encounter (HOSPITAL_COMMUNITY): Payer: Self-pay

## 2022-10-30 ENCOUNTER — Ambulatory Visit (HOSPITAL_COMMUNITY): Payer: Medicaid Other

## 2022-10-30 DIAGNOSIS — M5416 Radiculopathy, lumbar region: Secondary | ICD-10-CM

## 2022-10-30 DIAGNOSIS — M6281 Muscle weakness (generalized): Secondary | ICD-10-CM

## 2022-10-30 NOTE — Therapy (Signed)
OUTPATIENT PHYSICAL THERAPY THORACOLUMBAR EVALUATION   Patient Name: Shannon Russell MRN: [id number] DOB:1960/05/17, 62 y.o., female Today's Date: 10/30/2022  END OF SESSION:  PT End of Session - 10/30/22 1012     Visit Number 2    Number of Visits 12    Date for PT Re-Evaluation 12/06/22    Authorization Type [employer]    Authorization Time Period 12v from 10/29/22-12/28/22    Authorization - Visit Number 1    Authorization - Number of Visits 12    Progress Note Due on Visit 10    PT Start Time 0930    PT Stop Time 1010    PT Time Calculation (min) 40 min    Activity Tolerance Patient tolerated treatment well    Behavior During Therapy WFL for tasks assessed/performed              Past Medical History:  Diagnosis Date   Arthritis    Asthma    Cancer (HCC)    COPD (chronic obstructive pulmonary disease) (HCC)    Depression    Diabetes mellitus without complication (HCC)    H/O blood clots    PAD (peripheral artery disease) (HCC)    Thyroid disease    Past Surgical History:  Procedure Laterality Date   Bypass right leg  2015   CHOLECYSTECTOMY  1981   LOWER EXTREMITY ANGIOGRAPHY Right 02/20/2022   Procedure: Lower Extremity Angiography;  Surgeon: Renford Dills, MD;  Location: [facility] INVASIVE CV LAB;  Service: Cardiovascular;  Laterality: Right;   Mesh implant right leg  2015   THYROIDECTOMY  2013   TUBAL LIGATION  1993   Patient Active Problem List   Diagnosis Date Noted   Candidiasis of mouth 06/05/2022   Sinusitis 05/14/2022   Atherosclerosis of native arteries of extremity with intermittent claudication (HCC) 02/28/2022   Lymphedema 02/28/2022   Diabetes (HCC) 02/28/2022   Lactic acidosis 07/28/2021   COPD exacerbation (HCC) 07/27/2021   Acute renal failure superimposed on stage 2 chronic kidney disease (HCC) 07/27/2021   Obesity, Class I, BMI 30-34.9 07/27/2021   Hypothyroidism 07/27/2021   Frequent PVCs    Syncope 06/07/2020   Hypotension  06/07/2020   Hypomagnesemia 06/07/2020   Community acquired pneumonia 05/31/2020   Chronic respiratory failure with hypoxia (HCC) 05/31/2020   Syncope and collapse 05/31/2020   GERD (gastroesophageal reflux disease) 03/23/2020   Hyperlipidemia 03/23/2020   COPD (chronic obstructive pulmonary disease) (HCC) 04/20/2018   Nicotine dependence 04/20/2018   Type 2 diabetes mellitus with diabetic peripheral angiopathy without gangrene (HCC) 04/20/2018   PAD (peripheral artery disease) (HCC) 04/20/2018    PCP: Cam Hai, PA-C  REFERRING PROVIDER: Shelly Bombard  REFERRING DIAG: M54.16 (ICD-10-CM) - Lumbar radiculopathy  Rationale for Evaluation and Treatment: Rehabilitation  THERAPY DIAG:  Radiculopathy, lumbar region  Muscle weakness (generalized)  ONSET DATE: chronic a year and a half  SUBJECTIVE:  SUBJECTIVE STATEMENT: 6/10 pain today. Reporting she feels like the HEP, especially the fallouts are really helping. Pt noting significant improvement in walking around home and community.    PERTINENT HISTORY:  PAD, OA   PAIN:  Are you having pain? Yes: NPRS scale: 8/10; worst is a 10/10, 4 Pain location:  Rt hip into her groin; sometimes she will have pain in her Lt back pt also has pain going down into  her LT butt cheek.  Pain description: sharp Aggravating factors: wt bearing  Relieving factors: sitting   PRECAUTIONS: Fall  RED FLAGS: None   WEIGHT BEARING RESTRICTIONS: No  FALLS:  Has patient fallen in last 6 months? No and Yes. Number of falls 10-12  LIVING ENVIRONMENT: Lives with: lives with their son Lives in: House/apartment Stairs: Yes: External: 3 steps; on left going up, has to go up and down sideways  Has following equipment at home: None  OCCUPATION: retired  PLOF:  Independent with basic ADLs  PATIENT GOALS: less pain, to be able to stand/walk longer   NEXT MD VISIT: 6 weeks   OBJECTIVE:  Note: Objective measures were completed at Evaluation unless otherwise noted.  DIAGNOSTIC FINDINGS:  No access to x-ray      POSTURE: No Significant postural limitations  LUMBAR ROM:   AROM eval  Flexion Fingers 4" from ground pain increased coming up   Extension 11; reps no change  Right lateral flexion   Left lateral flexion   Right rotation   Left rotation    (Blank rows = not tested)  LOWER EXTREMITY ROM:     Active  Right eval Left eval  Hip flexion    Hip extension    Hip abduction    Hip adduction    Hip internal rotation    Hip external rotation    Knee flexion    Knee extension    Ankle dorsiflexion    Ankle plantarflexion    Ankle inversion    Ankle eversion     (Blank rows = not tested)  LOWER EXTREMITY MMT:    MMT Right eval Left eval  Hip flexion 5 3  Hip extension 4- 1  Hip abduction 5 1+  Hip adduction    Hip internal rotation    Hip external rotation    Knee flexion 5 3+  Knee extension 5 4+  Ankle dorsiflexion 4 3+   Ankle plantarflexion    Ankle inversion    Ankle eversion     (Blank rows = not tested)  FUNCTIONAL TESTS:  30 seconds chair stand test: 7 x no UE noted all wt on RT leg 8 is poor 14 average for sex and age  76 minute walk test: 226" Single leg stance:  RT:  3" , LT:   0"   TODAY'S TREATMENT:  DATE:  10/30/2022 Re -HEP Overview -Bent knee fall outs 2 x 15 bilaterally; cues for reduced pelvic movement  -Supine bridges 2 x 15 with 3' isometric -Clamshells to fatigue L side with 5' iso hold- cues for TA activation -seated Anti-rotation chest press 1 x 15 with GTB.  -Tandem stance with LLE back x 1 min; with UE tapping.   10/25/22 Eval   Sit to stand x 10 Bridge  x 10    PATIENT EDUCATION:  Education details: HEP Person educated: Patient Education method: Programmer, multimedia, Verbal cues, and Handouts Education comprehension: returned demonstration  HOME EXERCISE PROGRAM: Access Code: [email or web address] URL: [email or web address] Date: 10/25/2022 Prepared by: Virgina Organ  Exercises - Supine Bridge  - 2 x daily - 7 x weekly - 1 sets - 10 reps - 3-5 seconds  hold - Bent Knee Fallouts  - 2 x daily - 7 x weekly - 1 sets - 10 reps - 3" hold - Sit to Stand  - 2 x daily - 7 x weekly - 1 sets - 10 reps Access Code: [email or web address] URL: [email or web address] Date: 10/30/2022 Prepared by: Starling Manns  Exercises - Clamshell with Resistance  - 1 x daily - 7 x weekly - 3 sets - 10 reps - Tandem Stance  - 1 x daily - 7 x weekly - 3 sets - 10 reps - Seated Anti-Rotation Press With Anchored Resistance  - 1 x daily - 7 x weekly - 3 sets - 10 reps - Supine Bridge  - 1 x daily - 7 x weekly - 3 sets - 10 reps ASSESSMENT:  CLINICAL IMPRESSION: Pt tolerating session well. Overview of HEP, pt showing good carryover but benefits from cuing for slow and controlled movements to reduce lumbopelvic mobility. Educated squatting more during ADLs at home to reduce LBP and improving BLE strength. Continue to progress HEP, pt highly motivated with great carryover. To date, will continue to benefit from skilled PT services to address deficits and improve QOL.   OBJECTIVE IMPAIRMENTS: decreased activity tolerance, decreased balance, decreased mobility, difficulty walking, decreased ROM, decreased strength, and pain.   ACTIVITY LIMITATIONS: carrying, lifting, bending, standing, squatting, stairs, and locomotion level  PARTICIPATION LIMITATIONS: meal prep, cleaning, laundry, shopping, and community activity  PERSONAL FACTORS: Fitness, Past/current experiences, and Time since onset of injury/illness/exacerbation are also affecting patient's functional outcome.    REHAB POTENTIAL: Good  CLINICAL DECISION MAKING: Evolving/moderate complexity  EVALUATION COMPLEXITY: Moderate   GOALS: Goals reviewed with patient? No  SHORT TERM GOALS: Target date: 11/15/22  Pt to be I in HEP in order to decrease her pain to no greater than a 7/10 Baseline: Goal status: INITIAL  2.  Pt strength of core and LE mm to increase 1/2 grade to allow pt to be able to rise from a couch and get in and out of a car with greater ease.  Baseline:  Goal status: INITIAL  3.  Pt to be able to walk for four minutes  Baseline:  Goal status: INITIAL   LONG TERM GOALS: Target date: 12/06/22  Pt to be I in an advanced HEP in order to decrease her pain to no greater than a 5/10 Baseline:  Goal status: INITIAL  2.  Pt strength of core and LE mm to increase 1 grade to allow pt to go up and down steps in a reciprocal manner.   Baseline:  Goal status: INITIAL  3.  PT to be able to stand/walk for 15 minutes to make a small meal. Baseline:  Goal status: INITIAL  PLAN:  PT FREQUENCY: 2x/week  PT DURATION: 6 weeks  PLANNED INTERVENTIONS: 97110-Therapeutic exercises, 97530- Therapeutic activity, [id number]- Neuromuscular re-education, 97535- Self Care, and [id number]- Manual therapy.  PLAN FOR NEXT SESSION: begin lumbar/thoracic excursions and stabilization exercises.  Nelida Meuse PT, DPT Physical Therapist with [facility] [phone] office   10/30/2022, 10:15 AM

## 2022-11-01 ENCOUNTER — Encounter (HOSPITAL_COMMUNITY): Payer: Medicaid Other

## 2022-11-06 ENCOUNTER — Ambulatory Visit (HOSPITAL_COMMUNITY): Payer: Medicaid Other | Admitting: Physical Therapy

## 2022-11-06 DIAGNOSIS — M5416 Radiculopathy, lumbar region: Secondary | ICD-10-CM | POA: Diagnosis not present

## 2022-11-06 DIAGNOSIS — M6281 Muscle weakness (generalized): Secondary | ICD-10-CM

## 2022-11-06 DIAGNOSIS — R262 Difficulty in walking, not elsewhere classified: Secondary | ICD-10-CM

## 2022-11-06 NOTE — Therapy (Signed)
OUTPATIENT PHYSICAL THERAPY THORACOLUMBAR EVALUATION   Patient Name: Shannon Russell MRN: [id number] DOB:16-Feb-1960, 62 y.o., female Today's Date: 11/06/2022  END OF SESSION:  PT End of Session - 11/06/22 1048     Visit Number 3    Number of Visits 12    Date for PT Re-Evaluation 12/06/22    Authorization Type [employer]    Authorization Time Period 12v from 10/29/22-12/28/22    Authorization - Visit Number 2    Authorization - Number of Visits 12    Progress Note Due on Visit 10    PT Start Time 0937    PT Stop Time 1020    PT Time Calculation (min) 43 min    Activity Tolerance Patient tolerated treatment well    Behavior During Therapy WFL for tasks assessed/performed               Past Medical History:  Diagnosis Date   Arthritis    Asthma    Cancer (HCC)    COPD (chronic obstructive pulmonary disease) (HCC)    Depression    Diabetes mellitus without complication (HCC)    H/O blood clots    PAD (peripheral artery disease) (HCC)    Thyroid disease    Past Surgical History:  Procedure Laterality Date   Bypass right leg  2015   CHOLECYSTECTOMY  1981   LOWER EXTREMITY ANGIOGRAPHY Right 02/20/2022   Procedure: Lower Extremity Angiography;  Surgeon: Renford Dills, MD;  Location: [facility] INV[facility]IVE [facility];  Service: Cardiovascular;  Laterality: Right;   Mesh implant right leg  2015   THYROIDECTOMY  2013   TUBAL LIGATION  1993   Patient Active Problem List   Diagnosis Date Noted   Candidiasis of mouth 06/05/2022   Sinusitis 05/14/2022   Atherosclerosis of native arteries of extremity with intermittent claudication (HCC) 02/28/2022   Lymphedema 02/28/2022   Diabetes (HCC) 02/28/2022   Lactic acidosis 07/28/2021   COPD exacerbation (HCC) 07/27/2021   Acute renal failure superimposed on stage 2 chronic kidney disease (HCC) 07/27/2021   Obesity, Class I, BMI 30-34.9 07/27/2021   Hypothyroidism 07/27/2021   Frequent PVCs    Syncope 06/07/2020    Hypotension 06/07/2020   Hypomagnesemia 06/07/2020   Community acquired pneumonia 05/31/2020   Chronic respiratory failure with hypoxia (HCC) 05/31/2020   Syncope and collapse 05/31/2020   GERD (gastroesophageal reflux disease) 03/23/2020   Hyperlipidemia 03/23/2020   COPD (chronic obstructive pulmonary disease) (HCC) 04/20/2018   Nicotine dependence 04/20/2018   Type 2 diabetes mellitus with diabetic peripheral angiopathy without gangrene (HCC) 04/20/2018   PAD (peripheral artery disease) (HCC) 04/20/2018    PCP: Cam Hai, PA-C  REFERRING PROVIDER: Shelly Bombard  REFERRING DIAG: M54.16 (ICD-10-CM) - Lumbar radiculopathy  Rationale for Evaluation and Treatment: Rehabilitation  THERAPY DIAG:  Radiculopathy, lumbar region  Muscle weakness (generalized)  Difficulty in walking, not elsewhere classified  ONSET DATE: chronic a year and a half  SUBJECTIVE:  SUBJECTIVE STATEMENT: Pt states that her dog had puppies and she has been running herself to death.  Her dog needed to have a hysterectomy so she has been taking care of the puppies and the dog.  Her pain is up  PERTINENT HISTORY:  PAD, OA   PAIN:  Are you having pain? Yes: NPRS scale: 8/10; worst is a 10/10,  Pain location:  Rt hip into her groin; sometimes she will have pain in her Lt back pt also has pain going down into  her LT butt cheek.  Pain description: sharp Aggravating factors: wt bearing  Relieving factors: sitting   PRECAUTIONS: Fall  RED FLAGS: None   WEIGHT BEARING RESTRICTIONS: No  FALLS:  Has patient fallen in last 6 months? No and Yes. Number of falls 10-12  LIVING ENVIRONMENT: Lives with: lives with their son Lives in: House/apartment Stairs: Yes: External: 3 steps; on left going up, has to go up and down  sideways  Has following equipment at home: None  OCCUPATION: retired  PLOF: Independent with basic ADLs  PATIENT GOALS: less pain, to be able to stand/walk longer   NEXT MD VISIT: 6 weeks   OBJECTIVE:  Note: Objective measures were completed at Evaluation unless otherwise noted.  DIAGNOSTIC FINDINGS:  No access to x-ray      POSTURE: No Significant postural limitations  LUMBAR ROM:   AROM eval  Flexion Fingers 4" from ground pain increased coming up   Extension 11; reps no change  Right lateral flexion   Left lateral flexion   Right rotation   Left rotation    (Blank rows = not tested)  LOWER EXTREMITY ROM:     Active  Right eval Left eval  Hip flexion    Hip extension    Hip abduction    Hip adduction    Hip internal rotation    Hip external rotation    Knee flexion    Knee extension    Ankle dorsiflexion    Ankle plantarflexion    Ankle inversion    Ankle eversion     (Blank rows = not tested)  LOWER EXTREMITY MMT:    MMT Right eval Left eval  Hip flexion 5 3  Hip extension 4- 1  Hip abduction 5 1+  Hip adduction    Hip internal rotation    Hip external rotation    Knee flexion 5 3+  Knee extension 5 4+  Ankle dorsiflexion 4 3+   Ankle plantarflexion    Ankle inversion    Ankle eversion     (Blank rows = not tested)  FUNCTIONAL TESTS:  30 seconds chair stand test: 7 x no UE noted all wt on RT leg 8 is poor 14 average for sex and age  1 minute walk test: 226" Single leg stance:  RT:  3" , LT:   0"   TODAY'S TREATMENT:  DATE:  11/06/22 Sitting:  stretching on ball forward and rotating. Supine: knee to chest x 3 x 30" Trunk rotation x 5  Abdominal set x 10  Bent knee raise x 5 Bridge x 10  Clam x 10  Sitting thoracic excursion x 3 Standing hip excursion x 3 Nustep level 2 x 5 minutes   10/30/2022  -HEP  Overview -Bent knee fall outs 2 x 15 bilaterally; cues for reduced pelvic movement  -Supine bridges 2 x 15 with 3' isometric -Clamshells to fatigue L side with 5' iso hold- cues for TA activation -seated Anti-rotation chest press 1 x 15 with GTB.  -Tandem stance with LLE back x 1 min; with UE tapping.   10/25/22 Eval   Sit to stand x 10 Bridge x 10    PATIENT EDUCATION:  Education details: HEP Person educated: Patient Education method: Programmer, multimedia, Verbal cues, and Handouts Education comprehension: returned demonstration  HOME EXERCISE PROGRAM: Access Code: [email or web address] URL: [email or web address] Date: 10/25/2022 Prepared by: Virgina Organ  Exercises - Supine Bridge  - 2 x daily - 7 x weekly - 1 sets - 10 reps - 3-5 seconds  hold - Bent Knee Fallouts  - 2 x daily - 7 x weekly - 1 sets - 10 reps - 3" hold - Sit to Stand  - 2 x daily - 7 x weekly - 1 sets - 10 reps Access Code: [email or web address] URL: [email or web address] Date: 10/30/2022 Prepared by: Starling Manns  Exercises - Clamshell with Resistance  - 1 x daily - 7 x weekly - 3 sets - 10 reps - Tandem Stance  - 1 x daily - 7 x weekly - 3 sets - 10 reps - Seated Anti-Rotation Press With Anchored Resistan Supine Transversus Abdominis Bracing - Hands on Stomach  - 3 x daily - 7 x weekly - 1 sets - 10 reps - 5 hold 10/29  Hooklying Small March  - 3 x daily - 7 x weekly - 1 sets - 10 reps - 5 holdce  - 1 x daily - 7 x weekly - 3 sets - 10 reps - Supine Bridge  - 1 x daily - 7 x weekly - 3 sets - 10 reps  ASSESSMENT:  CLINICAL IMPRESSION: Pt lifting and moving more due to her dog having puppies and having a hysterectomy.  Treatment focused on stretching and gentle strengthening to decrease pain.  Pt Continue to have decreased activity tolerance, decreased strength, decreased balance and increased pain. To date, will continue to benefit from skilled PT services to address deficits and improve QOL.    OBJECTIVE IMPAIRMENTS: decreased activity tolerance, decreased balance, decreased mobility, difficulty walking, decreased ROM, decreased strength, and pain.   ACTIVITY LIMITATIONS: carrying, lifting, bending, standing, squatting, stairs, and locomotion level  PARTICIPATION LIMITATIONS: meal prep, cleaning, laundry, shopping, and community activity  PERSONAL FACTORS: Fitness, Past/current experiences, and Time since onset of injury/illness/exacerbation are also affecting patient's functional outcome.   REHAB POTENTIAL: Good  CLINICAL DECISION MAKING: Evolving/moderate complexity  EVALUATION COMPLEXITY: Moderate   GOALS: Goals reviewed with patient? No  SHORT TERM GOALS: Target date: 11/15/22  Pt to be I in HEP in order to decrease her pain to no greater than a 7/10 Baseline: Goal status: on-going   2.  Pt strength of core and LE mm to increase 1/2 grade to allow pt to be able to rise from a couch and get in and out of a car with greater ease.  Baseline:  Goal status:on-going   3.  Pt to be able to walk for four minutes  Baseline:  Goal status:on-going    LONG TERM GOALS: Target date: 12/06/22  Pt to be I in an advanced HEP in order to decrease her pain to no greater than a 5/10 Baseline:  Goal status: on-going   2.  Pt strength of core and LE mm to increase 1 grade to allow pt to go up and down steps in a reciprocal manner.   Baseline:  Goal status: on-going   3.  PT to be able to stand/walk for 15 minutes to make a small meal. Baseline:  Goal status: on-going   PLAN:  PT FREQUENCY: 2x/week  PT DURATION: 6 weeks  PLANNED INTERVENTIONS: 97110-Therapeutic exercises, 97530- Therapeutic activity, 97112- Neuromuscular re-education, 97535- Self Care, and 40981- Manual therapy.  PLAN FOR NEXT SESSION: stretching and  stabilization exercises. Virgina Organ, PT CLT  Physical Therapist with [facility] [phone] office    11/06/2022, 10:50 AM

## 2022-11-08 ENCOUNTER — Encounter (HOSPITAL_COMMUNITY): Payer: Self-pay

## 2022-11-08 ENCOUNTER — Ambulatory Visit (HOSPITAL_COMMUNITY): Payer: Medicaid Other

## 2022-11-08 DIAGNOSIS — M6281 Muscle weakness (generalized): Secondary | ICD-10-CM

## 2022-11-08 DIAGNOSIS — M5416 Radiculopathy, lumbar region: Secondary | ICD-10-CM | POA: Diagnosis not present

## 2022-11-08 DIAGNOSIS — R262 Difficulty in walking, not elsewhere classified: Secondary | ICD-10-CM

## 2022-11-08 NOTE — Therapy (Signed)
OUTPATIENT PHYSICAL THERAPY THORACOLUMBAR TREATMENT   Patient Name: Shannon Russell MRN: [id number] DOB:31-Mar-1960, 62 y.o., female Today's Date: 11/08/2022  END OF SESSION:  PT End of Session - 11/08/22 0955     Visit Number 4    Number of Visits 12    Date for PT Re-Evaluation 12/06/22    Authorization Type [employer]    Authorization Time Period 12v from 10/29/22-12/28/22    Authorization - Visit Number 3    Authorization - Number of Visits 12    Progress Note Due on Visit 10    PT Start Time 0934    PT Stop Time 1015    PT Time Calculation (min) 41 min    Activity Tolerance Patient tolerated treatment well    Behavior During Therapy WFL for tasks assessed/performed                Past Medical History:  Diagnosis Date   Arthritis    Asthma    Cancer (HCC)    COPD (chronic obstructive pulmonary disease) (HCC)    Depression    Diabetes mellitus without complication (HCC)    H/O blood clots    PAD (peripheral artery disease) (HCC)    Thyroid disease    Past Surgical History:  Procedure Laterality Date   Bypass right leg  2015   CHOLECYSTECTOMY  1981   LOWER EXTREMITY ANGIOGRAPHY Right 02/20/2022   Procedure: Lower Extremity Angiography;  Surgeon: Renford Dills, MD;  Location: [facility] INVASIVE CV [facility];  Service: Cardiovascular;  Laterality: Right;   Mesh implant right leg  2015   THYROIDECTOMY  2013   TUBAL LIGATION  1993   Patient Active Problem List   Diagnosis Date Noted   Candidiasis of mouth 06/05/2022   Sinusitis 05/14/2022   Atherosclerosis of native arteries of extremity with intermittent claudication (HCC) 02/28/2022   Lymphedema 02/28/2022   Diabetes (HCC) 02/28/2022   Lactic acidosis 07/28/2021   COPD exacerbation (HCC) 07/27/2021   Acute renal failure superimposed on stage 2 chronic kidney disease (HCC) 07/27/2021   Obesity, Class I, BMI 30-34.9 07/27/2021   Hypothyroidism 07/27/2021   Frequent PVCs    Syncope 06/07/2020    Hypotension 06/07/2020   Hypomagnesemia 06/07/2020   Community acquired pneumonia 05/31/2020   Chronic respiratory failure with hypoxia (HCC) 05/31/2020   Syncope and collapse 05/31/2020   GERD (gastroesophageal reflux disease) 03/23/2020   Hyperlipidemia 03/23/2020   COPD (chronic obstructive pulmonary disease) (HCC) 04/20/2018   Nicotine dependence 04/20/2018   Type 2 diabetes mellitus with diabetic peripheral angiopathy without gangrene (HCC) 04/20/2018   PAD (peripheral artery disease) (HCC) 04/20/2018    PCP: Cam Hai, PA-C  REFERRING PROVIDER: Shelly Bombard  REFERRING DIAG: M54.16 (ICD-10-CM) - Lumbar radiculopathy  Rationale for Evaluation and Treatment: Rehabilitation  THERAPY DIAG:  Difficulty in walking, not elsewhere classified  Muscle weakness (generalized)  Radiculopathy, lumbar region  ONSET DATE: chronic a year and a half  SUBJECTIVE:  SUBJECTIVE STATEMENT:  Pt reports she has Lt groin pain scale, 6/10.  Reports she had to put the mother dog of her 3 day old puppies to rest yesterday, has been having to take care of them.  PERTINENT HISTORY:  PAD, OA   PAIN:  Are you having pain? Yes: NPRS scale:current: 6/10; 8/10; worst is a 10/10,  Pain location:  Rt hip into her groin; sometimes she will have pain in her Lt back pt also has pain going down into  her LT butt cheek.  Pain description: sharp Aggravating factors: wt bearing  Relieving factors: sitting   PRECAUTIONS: Fall  RED FLAGS: None   WEIGHT BEARING RESTRICTIONS: No  FALLS:  Has patient fallen in last 6 months? No and Yes. Number of falls 10-12  LIVING ENVIRONMENT: Lives with: lives with their son Lives in: House/apartment Stairs: Yes: External: 3 steps; on left going up, has to go up and down  sideways  Has following equipment at home: None  OCCUPATION: retired  PLOF: Independent with basic ADLs  PATIENT GOALS: less pain, to be able to stand/walk longer   NEXT MD VISIT: 6 weeks   OBJECTIVE:  Note: Objective measures were completed at Evaluation unless otherwise noted.  DIAGNOSTIC FINDINGS:  No access to x-ray      POSTURE: No Significant postural limitations  LUMBAR ROM:   AROM eval  Flexion Fingers 4" from ground pain increased coming up   Extension 11; reps no change  Right lateral flexion   Left lateral flexion   Right rotation   Left rotation    (Blank rows = not tested)  LOWER EXTREMITY ROM:     Active  Right eval Left eval  Hip flexion    Hip extension    Hip abduction    Hip adduction    Hip internal rotation    Hip external rotation    Knee flexion    Knee extension    Ankle dorsiflexion    Ankle plantarflexion    Ankle inversion    Ankle eversion     (Blank rows = not tested)  LOWER EXTREMITY MMT:    MMT Right eval Left eval  Hip flexion 5 3  Hip extension 4- 1  Hip abduction 5 1+  Hip adduction    Hip internal rotation    Hip external rotation    Knee flexion 5 3+  Knee extension 5 4+  Ankle dorsiflexion 4 3+   Ankle plantarflexion    Ankle inversion    Ankle eversion     (Blank rows = not tested)  FUNCTIONAL TESTS:  30 seconds chair stand test: 7 x no UE noted all wt on RT leg 8 is poor 14 average for sex and age  84 minute walk test: 226" Single leg stance:  RT:  3" , LT:   0"   TODAY'S TREATMENT:  DATE:  11/08/22: Trunk rotation x 5  Abdominal set x 10  Isometric add with ball squeeze/ abduction into belt Bridge with ball squeeze  Sidelying: Clam with RTB around thigh 10x 3-5"  Standing:  3D Hip excursion 10x Nustep unavailable  11/06/22 Sitting:  stretching on ball forward and  rotating. Supine: knee to chest x 3 x 30" Trunk rotation x 5  Abdominal set x 10  Bent knee raise x 5 Bridge x 10  Clam x 10  Sitting thoracic excursion x 3 Standing hip excursion x 3 Nustep level 2 x 5 minutes   10/30/2022  -HEP Overview -Bent knee fall outs 2 x 15 bilaterally; cues for reduced pelvic movement  -Supine bridges 2 x 15 with 3' isometric -Clamshells to fatigue L side with 5' iso hold- cues for TA activation -seated Anti-rotation chest press 1 x 15 with GTB.  -Tandem stance with LLE back x 1 min; with UE tapping.   10/25/22 Eval   Sit to stand x 10 Bridge x 10    PATIENT EDUCATION:  Education details: HEP Person educated: Patient Education method: Programmer, multimedia, Verbal cues, and Handouts Education comprehension: returned demonstration  HOME EXERCISE PROGRAM: Access Code: [email or web address] URL: [email or web address] Date: 10/25/2022 Prepared by: Virgina Organ  Exercises - Supine Bridge  - 2 x daily - 7 x weekly - 1 sets - 10 reps - 3-5 seconds  hold - Bent Knee Fallouts  - 2 x daily - 7 x weekly - 1 sets - 10 reps - 3" hold - Sit to Stand  - 2 x daily - 7 x weekly - 1 sets - 10 reps Access Code: [email or web address] URL: [email or web address] Date: 10/30/2022 Prepared by: Starling Manns  Exercises - Clamshell with Resistance  - 1 x daily - 7 x weekly - 3 sets - 10 reps - Tandem Stance  - 1 x daily - 7 x weekly - 3 sets - 10 reps - Seated Anti-Rotation Press With Anchored Resistan Supine Transversus Abdominis Bracing - Hands on Stomach  - 3 x daily - 7 x weekly - 1 sets - 10 reps - 5 hold 10/29  Hooklying Small March  - 3 x daily - 7 x weekly - 1 sets - 10 reps - 5 holdce  - 1 x daily - 7 x weekly - 3 sets - 10 reps - Supine Bridge  - 1 x daily - 7 x weekly - 3 sets - 10 reps  ASSESSMENT:  CLINICAL IMPRESSION: Session focus with core and proximal strengthening and lumbar mobility.  Added isometric adduction/abduction for pelvic floor  strengthening and glut med strengthening with reports of pain reduced during this session, given printout to add to HEP.  Pt tolerated well with no reports of increased pain.    OBJECTIVE IMPAIRMENTS: decreased activity tolerance, decreased balance, decreased mobility, difficulty walking, decreased ROM, decreased strength, and pain.   ACTIVITY LIMITATIONS: carrying, lifting, bending, standing, squatting, stairs, and locomotion level  PARTICIPATION LIMITATIONS: meal prep, cleaning, laundry, shopping, and community activity  PERSONAL FACTORS: Fitness, Past/current experiences, and Time since onset of injury/illness/exacerbation are also affecting patient's functional outcome.   REHAB POTENTIAL: Good  CLINICAL DECISION MAKING: Evolving/moderate complexity  EVALUATION COMPLEXITY: Moderate   GOALS: Goals reviewed with patient? No  SHORT TERM GOALS: Target date: 11/15/22  Pt to be I in HEP in order to decrease her pain to no greater than a 7/10 Baseline: Goal status: on-going   2.  Pt strength of core and LE mm to increase  1/2 grade to allow pt to be able to rise from a couch and get in and out of a car with greater ease.  Baseline:  Goal status:on-going   3.  Pt to be able to walk for four minutes  Baseline:  Goal status:on-going    LONG TERM GOALS: Target date: 12/06/22  Pt to be I in an advanced HEP in order to decrease her pain to no greater than a 5/10 Baseline:  Goal status: on-going   2.  Pt strength of core and LE mm to increase 1 grade to allow pt to go up and down steps in a reciprocal manner.   Baseline:  Goal status: on-going   3.  PT to be able to stand/walk for 15 minutes to make a small meal. Baseline:  Goal status: on-going   PLAN:  PT FREQUENCY: 2x/week  PT DURATION: 6 weeks  PLANNED INTERVENTIONS: 97110-Therapeutic exercises, 97530- Therapeutic activity, 97112- Neuromuscular re-education, 97535- Self Care, and 21308- Manual therapy.  PLAN FOR NEXT  SESSION: stretching and  stabilization exercises.   Becky Sax, LPTA/CLT; CBIS [phone]  Juel Burrow, PTA 11/08/2022, 4:41 PM  11/08/2022, 4:41 PM

## 2022-11-13 ENCOUNTER — Encounter (HOSPITAL_COMMUNITY): Payer: Self-pay

## 2022-11-13 ENCOUNTER — Encounter (HOSPITAL_COMMUNITY): Payer: Medicaid Other

## 2022-11-13 NOTE — Therapy (Signed)
[facility] at [facility] [address] [place], [place], [address] Phone: [phone]   Fax:  [phone]  Patient Details  Name: Shannon Russell MRN: [id number] Date of Birth: 1960-04-22 Referring Provider:  No ref. provider found  Encounter Date: 11/13/2022  Pt called regarding #1 no show. Pt answered and reported that she had been sick all night long and forgot about the appointment until 20 minutes from now. Pt was informed if running a fever do not come to next appointment on 11/7. Pt confirmed.   Nelida Meuse, PT 11/13/2022, 9:53 AM  [facility] at [facility] [address] [place], [place], [address] Phone: [phone]   Fax:  [phone]

## 2022-11-14 ENCOUNTER — Institutional Professional Consult (permissible substitution): Payer: Medicaid Other | Admitting: Internal Medicine

## 2022-11-15 ENCOUNTER — Ambulatory Visit (HOSPITAL_COMMUNITY): Payer: Medicaid Other | Attending: Physician Assistant | Admitting: Physical Therapy

## 2022-11-15 DIAGNOSIS — M6281 Muscle weakness (generalized): Secondary | ICD-10-CM | POA: Diagnosis present

## 2022-11-15 DIAGNOSIS — M5416 Radiculopathy, lumbar region: Secondary | ICD-10-CM | POA: Diagnosis present

## 2022-11-15 DIAGNOSIS — R262 Difficulty in walking, not elsewhere classified: Secondary | ICD-10-CM | POA: Insufficient documentation

## 2022-11-15 NOTE — Therapy (Signed)
OUTPATIENT PHYSICAL THERAPY THORACOLUMBAR TREATMENT   Patient Name: Shannon Russell MRN: [id number] DOB:[birth year], 62 y.o., female Today's Date: 11/15/2022  END OF SESSION:  PT End of Session - 11/15/22 1015     Visit Number 5    Number of Visits 12    Date for PT Re-Evaluation 12/06/22    Authorization Type [employer]    Authorization Time Period 12v from 10/29/22-12/28/22    Authorization - Visit Number 4    Authorization - Number of Visits 12    Progress Note Due on Visit 10    PT Start Time 0935    PT Stop Time 1015    PT Time Calculation (min) 40 min    Activity Tolerance Patient tolerated treatment well    Behavior During Therapy WFL for tasks assessed/performed                 Past Medical History:  Diagnosis Date   Arthritis    Asthma    Cancer (HCC)    COPD (chronic obstructive pulmonary disease) (HCC)    Depression    Diabetes mellitus without complication (HCC)    H/O blood clots    PAD (peripheral artery disease) (HCC)    Thyroid disease    Past Surgical History:  Procedure Laterality Date   Bypass right leg  2015   CHOLECYSTECTOMY  1981   LOWER EXTREMITY ANGIOGRAPHY Right 02/20/2022   Procedure: Lower Extremity Angiography;  Surgeon: Renford Dills, MD;  Location: [facility];  Service: Cardiovascular;  Laterality: Right;   Mesh implant right leg  2015   THYROIDECTOMY  2013   TUBAL LIGATION  1993   Patient Active Problem List   Diagnosis Date Noted   Candidiasis of mouth 06/05/2022   Sinusitis 05/14/2022   Atherosclerosis of native arteries of extremity with intermittent claudication (HCC) 02/28/2022   Lymphedema 02/28/2022   Diabetes (HCC) 02/28/2022   Lactic acidosis 07/28/2021   COPD exacerbation (HCC) 07/27/2021   Acute renal failure superimposed on stage 2 chronic kidney disease (HCC) 07/27/2021   Obesity, Class I, BMI 30-34.9 07/27/2021   Hypothyroidism 07/27/2021   Frequent PVCs    Syncope 06/07/2020    Hypotension 06/07/2020   Hypomagnesemia 06/07/2020   Community acquired pneumonia 05/31/2020   Chronic respiratory failure with hypoxia (HCC) 05/31/2020   Syncope and collapse 05/31/2020   GERD (gastroesophageal reflux disease) 03/23/2020   Hyperlipidemia 03/23/2020   COPD (chronic obstructive pulmonary disease) (HCC) 04/20/2018   Nicotine dependence 04/20/2018   Type 2 diabetes mellitus with diabetic peripheral angiopathy without gangrene (HCC) 04/20/2018   PAD (peripheral artery disease) (HCC) 04/20/2018    PCP: Cam Hai, PA-C  REFERRING PROVIDER: Shelly Bombard  REFERRING DIAG: M54.16 (ICD-10-CM) - Lumbar radiculopathy  Rationale for Evaluation and Treatment: Rehabilitation  THERAPY DIAG:  Difficulty in walking, not elsewhere classified  Muscle weakness (generalized)  Radiculopathy, lumbar region  ONSET DATE: chronic a year and a half  SUBJECTIVE:  SUBJECTIVE STATEMENT:  PT states that she is completing her HEP and has no questions.  PERTINENT HISTORY:  PAD, OA   PAIN:  Are you having pain? Yes: NPRS scale:current: 5/10 Pain location:  Rt hip into her groin; sometimes she will have pain in her Lt back pt also has pain going down into  her LT butt cheek.  Pain description: sharp Aggravating factors: wt bearing  Relieving factors: sitting   PRECAUTIONS: Fall  RED FLAGS: None   WEIGHT BEARING RESTRICTIONS: No  FALLS:  Has patient fallen in last 6 months? No and Yes. Number of falls 10-12  LIVING ENVIRONMENT: Lives with: lives with their son Lives in: House/apartment Stairs: Yes: External: 3 steps; on left going up, has to go up and down sideways  Has following equipment at home: None  OCCUPATION: retired  PLOF: Independent with basic ADLs  PATIENT GOALS: less pain,  to be able to stand/walk longer   NEXT MD VISIT: 6 weeks   OBJECTIVE:  Note: Objective measures were completed at Evaluation unless otherwise noted.  DIAGNOSTIC FINDINGS:  No access to x-ray      POSTURE: No Significant postural limitations  LUMBAR ROM:   AROM eval  Flexion Fingers 4" from ground pain increased coming up   Extension 11; reps no change  Right lateral flexion   Left lateral flexion   Right rotation   Left rotation    (Blank rows = not tested)  LOWER EXTREMITY ROM:     Active  Right eval Left eval  Hip flexion    Hip extension    Hip abduction    Hip adduction    Hip internal rotation    Hip external rotation    Knee flexion    Knee extension    Ankle dorsiflexion    Ankle plantarflexion    Ankle inversion    Ankle eversion     (Blank rows = not tested)  LOWER EXTREMITY MMT:    MMT Right eval Left eval  Hip flexion 5 3  Hip extension 4- 1  Hip abduction 5 1+  Hip adduction    Hip internal rotation    Hip external rotation    Knee flexion 5 3+  Knee extension 5 4+  Ankle dorsiflexion 4 3+   Ankle plantarflexion    Ankle inversion    Ankle eversion     (Blank rows = not tested)  FUNCTIONAL TESTS:  30 seconds chair stand test: 7 x no UE noted all wt on RT leg 8 is poor 14 average for sex and age  27 minute walk test: 226" Single leg stance:  RT:  3" , LT:   0"   TODAY'S TREATMENT:                                                                                                                              DATE:  11/15/22 Wall arch x 10 Supine:  Ab set  x 10  Bridge x 10 Knee to chest x 3 x 30" Hamstring stretch 3 x 30" Prone: one pillow under lower abdominal  Heel squeeze x 10  Single leg raise x 10  Sit to stand x 10  Standing extension x 10  PATIENT EDUCATION:  Education details: HEP Person educated: Patient Education method: Programmer, multimedia, Verbal cues, and Handouts Education comprehension: returned demonstration  HOME  EXERCISE PROGRAM: Access Code: [email or web address] URL: [email or web address] Date: 10/25/2022 Prepared by: Virgina Organ  Exercises - Supine Bridge  - 2 x daily - 7 x weekly - 1 sets - 10 reps - 3-5 seconds  hold - Bent Knee Fallouts  - 2 x daily - 7 x weekly - 1 sets - 10 reps - 3" hold - Sit to Stand  - 2 x daily - 7 x weekly - 1 sets - 10 reps Access Code: [email or web address] URL: [email or web address] Date: 10/30/2022 Prepared by: Starling Manns  Exercises - Clamshell with Resistance  - 1 x daily - 7 x weekly - 3 sets - 10 reps - Tandem Stance  - 1 x daily - 7 x weekly - 3 sets - 10 reps - Seated Anti-Rotation Press With Anchored [place]istan Supine Transversus Abdominis Bracing - Hands on Stomach  - 3 x daily - 7 x weekly - 1 sets - 10 reps - 5 hold 10/29  Hooklying Small March  - 3 x daily - 7 x weekly - 1 sets - 10 reps - 5 holdce  - 1 x daily - 7 x weekly - 3 sets - 10 reps - Supine Bridge  - 1 x daily - 7 x weekly - 3 sets - 10 reps 11/14/22 - Supine Single Knee to Chest Stretch  - 2 x daily - 7 x weekly - 1 sets - 3 reps - 30" hold - Supine Hamstring Stretch  - 2 x daily - 7 x weekly - 1 sets - 3 reps - 30" hold - Standing Lumbar Extension  - 3 x daily - 7 x weekly - 1 sets - 10 reps - 2" hold ASSESSMENT:  CLINICAL IMPRESSION:  Pt with significant decreased ROM, balance and strength. Added new exercises to pt with pt able to demonstrate good carry over.   PT HEP updated. PT will continue to benefit from skilled PT to address these deficits to improve her functional ability.     OBJECTIVE IMPAIRMENTS: decreased activity tolerance, decreased balance, decreased mobility, difficulty walking, decreased ROM, decreased strength, and pain.   ACTIVITY LIMITATIONS: carrying, lifting, bending, standing, squatting, stairs, and locomotion level  PARTICIPATION LIMITATIONS: meal prep, cleaning, laundry, shopping, and community activity  PERSONAL FACTORS: Fitness, Past/current  experiences, and Time since onset of injury/illness/exacerbation are also affecting patient's functional outcome.   REHAB POTENTIAL: Good  CLINICAL DECISION MAKING: Evolving/moderate complexity  EVALUATION COMPLEXITY: Moderate   GOALS: Goals reviewed with patient? No  SHORT TERM GOALS: Target date: 11/15/22  Pt to be I in HEP in order to decrease her pain to no greater than a 7/10 Baseline: Goal status: on-going   2.  Pt strength of core and LE mm to increase 1/2 grade to allow pt to be able to rise from a couch and get in and out of a car with greater ease.  Baseline:  Goal status:on-going   3.  Pt to be able to walk for four minutes  Baseline:  Goal status:on-going    LONG TERM GOALS: Target date: 12/06/22  Pt to be I in an advanced  HEP in order to decrease her pain to no greater than a 5/10 Baseline:  Goal status: on-going   2.  Pt strength of core and LE mm to increase 1 grade to allow pt to go up and down steps in a reciprocal manner.   Baseline:  Goal status: on-going   3.  PT to be able to stand/walk for 15 minutes to make a small meal. Baseline:  Goal status: on-going   PLAN:  PT FREQUENCY: 2x/week  PT DURATION: 6 weeks  PLANNED INTERVENTIONS: 97110-Therapeutic exercises, 97530- Therapeutic activity, 97112- Neuromuscular re-education, 97535- Self Care, and 54098- Manual therapy.  PLAN FOR NEXT SESSION: stretching and  stabilization exercises.  Virgina Organ, PT CLT [phone]  11/15/2022, 10:16 AM

## 2022-11-19 ENCOUNTER — Ambulatory Visit (HOSPITAL_COMMUNITY): Payer: Medicaid Other

## 2022-11-19 ENCOUNTER — Encounter (HOSPITAL_COMMUNITY): Payer: Self-pay

## 2022-11-19 DIAGNOSIS — M6281 Muscle weakness (generalized): Secondary | ICD-10-CM

## 2022-11-19 DIAGNOSIS — R262 Difficulty in walking, not elsewhere classified: Secondary | ICD-10-CM | POA: Diagnosis not present

## 2022-11-19 DIAGNOSIS — M5416 Radiculopathy, lumbar region: Secondary | ICD-10-CM

## 2022-11-19 NOTE — Therapy (Signed)
OUTPATIENT PHYSICAL THERAPY THORACOLUMBAR TREATMENT   Patient Name: Shannon Russell MRN: [id number] DOB:[birth year], 62 y.o., female Today's Date: 11/19/2022  END OF SESSION: END OF SESSION:   PT End of Session - 11/19/22 1158     Visit Number 6    Number of Visits 12    Date for PT Re-Evaluation 12/06/22    Authorization Type [employer]    Authorization Time Period 12v from 10/29/22-12/28/22    Authorization - Visit Number 5    Authorization - Number of Visits 12    Progress Note Due on Visit 10    PT Start Time 1103    PT Stop Time 1148    PT Time Calculation (min) 45 min    Activity Tolerance Patient tolerated treatment well    Behavior During Therapy WFL for tasks assessed/performed             Past Medical History:  Diagnosis Date   Arthritis    Asthma    Cancer (HCC)    COPD (chronic obstructive pulmonary disease) (HCC)    Depression    Diabetes mellitus without complication (HCC)    H/O blood clots    PAD (peripheral artery disease) (HCC)    Thyroid disease    Past Surgical History:  Procedure Laterality Date   Bypass right leg  2015   CHOLECYSTECTOMY  1981   LOWER EXTREMITY ANGIOGRAPHY Right 02/20/2022   Procedure: Lower Extremity Angiography;  Surgeon: Renford Dills, MD;  Location: [facility];  Service: Cardiovascular;  Laterality: Right;   Mesh implant right leg  2015   THYROIDECTOMY  2013   TUBAL LIGATION  1993   Patient Active Problem List   Diagnosis Date Noted   Candidiasis of mouth 06/05/2022   Sinusitis 05/14/2022   Atherosclerosis of native arteries of extremity with intermittent claudication (HCC) 02/28/2022   Lymphedema 02/28/2022   Diabetes (HCC) 02/28/2022   Lactic acidosis 07/28/2021   COPD exacerbation (HCC) 07/27/2021   Acute renal failure superimposed on stage 2 chronic kidney disease (HCC) 07/27/2021   Obesity, Class I, BMI 30-34.9 07/27/2021   Hypothyroidism 07/27/2021   Frequent PVCs    Syncope 06/07/2020    Hypotension 06/07/2020   Hypomagnesemia 06/07/2020   Community acquired pneumonia 05/31/2020   Chronic respiratory failure with hypoxia (HCC) 05/31/2020   Syncope and collapse 05/31/2020   GERD (gastroesophageal reflux disease) 03/23/2020   Hyperlipidemia 03/23/2020   COPD (chronic obstructive pulmonary disease) (HCC) 04/20/2018   Nicotine dependence 04/20/2018   Type 2 diabetes mellitus with diabetic peripheral angiopathy without gangrene (HCC) 04/20/2018   PAD (peripheral artery disease) (HCC) 04/20/2018    PCP: Cam Hai, PA-C  REFERRING PROVIDER: Shelly Bombard  REFERRING DIAG: M54.16 (ICD-10-CM) - Lumbar radiculopathy  Rationale for Evaluation and Treatment: Rehabilitation  THERAPY DIAG:  Difficulty in walking, not elsewhere classified  Muscle weakness (generalized)  Radiculopathy, lumbar region  ONSET DATE: chronic a year and a half  SUBJECTIVE:  SUBJECTIVE STATEMENT:  Pt stated she has pain with Lt groin area, pain scale 8/10 with movement.    PERTINENT HISTORY:  PAD, OA   PAIN:  Are you having pain? Yes: NPRS scale:current: 5/10 Pain location:  Rt hip into her groin; sometimes she will have pain in her Lt back pt also has pain going down into  her LT butt cheek.  Pain description: sharp Aggravating factors: wt bearing  Relieving factors: sitting   PRECAUTIONS: Fall  RED FLAGS: None   WEIGHT BEARING RESTRICTIONS: No  FALLS:  Has patient fallen in last 6 months? No and Yes. Number of falls 10-12  LIVING ENVIRONMENT: Lives with: lives with their son Lives in: House/apartment Stairs: Yes: External: 3 steps; on left going up, has to go up and down sideways  Has following equipment at home: None  OCCUPATION: retired  PLOF: Independent with basic ADLs  PATIENT  GOALS: less pain, to be able to stand/walk longer   NEXT MD VISIT: 6 weeks   OBJECTIVE:  Note: Objective measures were completed at Evaluation unless otherwise noted.  DIAGNOSTIC FINDINGS:  No access to x-ray      POSTURE: No Significant postural limitations  LUMBAR ROM:   AROM eval  Flexion Fingers 4" from ground pain increased coming up   Extension 11; reps no change  Right lateral flexion   Left lateral flexion   Right rotation   Left rotation    (Blank rows = not tested)  LOWER EXTREMITY ROM:     Active  Right eval Left eval  Hip flexion    Hip extension    Hip abduction    Hip adduction    Hip internal rotation    Hip external rotation    Knee flexion    Knee extension    Ankle dorsiflexion    Ankle plantarflexion    Ankle inversion    Ankle eversion     (Blank rows = not tested)  LOWER EXTREMITY MMT:    MMT Right eval Left eval  Hip flexion 5 3  Hip extension 4- 1  Hip abduction 5 1+  Hip adduction    Hip internal rotation    Hip external rotation    Knee flexion 5 3+  Knee extension 5 4+  Ankle dorsiflexion 4 3+   Ankle plantarflexion    Ankle inversion    Ankle eversion     (Blank rows = not tested)  FUNCTIONAL TESTS:  30 seconds chair stand test: 7 x no UE noted all wt on RT leg 8 is poor 14 average for sex and age  74 minute walk test: 226" Single leg stance:  RT:  3" , LT:   0"   TODAY'S TREATMENT:                                                                                                                              DATE:  11/19/22: Wall arch x 10  Checked SI alignment MET Rt SI anterior rotation then outflare Equal LLD following and pain reduced to 6/10 reduced from 8/10 2 min walk with ab set with cueing for appropriate arm swing Supine:  Pubic clearing Bridge 10x Ab set 10x STS 10x with ab set   11/15/22 Wall arch x 10 Supine:  Ab set x 10  Bridge x 10 Knee to chest x 3 x 30" Hamstring stretch 3 x 30" Prone:  one pillow under lower abdominal  Heel squeeze x 10  Single leg raise x 10  Sit to stand x 10  Standing extension x 10  PATIENT EDUCATION:  Education details: HEP Person educated: Patient Education method: Programmer, multimedia, Verbal cues, and Handouts Education comprehension: returned demonstration  HOME EXERCISE PROGRAM: Access Code: [email or web address] URL: [email or web address] Date: 10/25/2022 Prepared by: Virgina Organ  Exercises - Supine Bridge  - 2 x daily - 7 x weekly - 1 sets - 10 reps - 3-5 seconds  hold - Bent Knee Fallouts  - 2 x daily - 7 x weekly - 1 sets - 10 reps - 3" hold - Sit to Stand  - 2 x daily - 7 x weekly - 1 sets - 10 reps Access Code: [email or web address] URL: [email or web address] Date: 10/30/2022 Prepared by: Starling Manns  Exercises - Clamshell with Resistance  - 1 x daily - 7 x weekly - 3 sets - 10 reps - Tandem Stance  - 1 x daily - 7 x weekly - 3 sets - 10 reps - Seated Anti-Rotation Press With Anchored [place]istan Supine Transversus Abdominis Bracing - Hands on Stomach  - 3 x daily - 7 x weekly - 1 sets - 10 reps - 5 hold 10/29  Hooklying Small March  - 3 x daily - 7 x weekly - 1 sets - 10 reps - 5 holdce  - 1 x daily - 7 x weekly - 3 sets - 10 reps - Supine Bridge  - 1 x daily - 7 x weekly - 3 sets - 10 reps 11/14/22 - Supine Single Knee to Chest Stretch  - 2 x daily - 7 x weekly - 1 sets - 3 reps - 30" hold - Supine Hamstring Stretch  - 2 x daily - 7 x weekly - 1 sets - 3 reps - 30" hold - Standing Lumbar Extension  - 3 x daily - 7 x weekly - 1 sets - 10 reps - 2" hold ASSESSMENT:  CLINICAL IMPRESSION:  Pt with antalgic gait mechanics and increased pain with gait.  Checked SI alignments with LLD noted, MET complete with improved leg length and reports of pain reduced following.  Cueing to improve arm swing and reduce ER Lt LE.  Therex focus on core stability and proximal strengthening.  EOS pt reports pain down and improved gait mechanics.     OBJECTIVE IMPAIRMENTS: decreased activity tolerance, decreased balance, decreased mobility, difficulty walking, decreased ROM, decreased strength, and pain.   ACTIVITY LIMITATIONS: carrying, lifting, bending, standing, squatting, stairs, and locomotion level  PARTICIPATION LIMITATIONS: meal prep, cleaning, laundry, shopping, and community activity  PERSONAL FACTORS: Fitness, Past/current experiences, and Time since onset of injury/illness/exacerbation are also affecting patient's functional outcome.   REHAB POTENTIAL: Good  CLINICAL DECISION MAKING: Evolving/moderate complexity  EVALUATION COMPLEXITY: Moderate   GOALS: Goals reviewed with patient? No  SHORT TERM GOALS: Target date: 11/15/22  Pt to be I in HEP in order to decrease her pain to no greater than a 7/10 Baseline: Goal status: on-going   2.  Pt strength of core and LE mm to increase 1/2 grade to allow pt to be able to rise from a couch and get in and out of a car with greater ease.  Baseline:  Goal status:on-going   3.  Pt to be able to walk for four minutes  Baseline:  Goal status:on-going    LONG TERM GOALS: Target date: 12/06/22  Pt to be I in an advanced HEP in order to decrease her pain to no greater than a 5/10 Baseline:  Goal status: on-going   2.  Pt strength of core and LE mm to increase 1 grade to allow pt to go up and down steps in a reciprocal manner.   Baseline:  Goal status: on-going   3.  PT to be able to stand/walk for 15 minutes to make a small meal. Baseline:  Goal status: on-going   PLAN:  PT FREQUENCY: 2x/week  PT DURATION: 6 weeks  PLANNED INTERVENTIONS: 97110-Therapeutic exercises, 97530- Therapeutic activity, 97112- Neuromuscular re-education, 97535- Self Care, and [id number]- Manual therapy.  PLAN FOR NEXT SESSION: stretching and  stabilization exercises.  Check leg length, MET PRN  Becky Sax, LPTA/CLT; CBIS [phone]  Juel Burrow, PTA 11/19/2022, 11:59  AM   11/19/2022, 11:59 AM

## 2022-11-21 ENCOUNTER — Ambulatory Visit (HOSPITAL_COMMUNITY): Payer: Medicaid Other | Admitting: Physical Therapy

## 2022-11-21 DIAGNOSIS — R262 Difficulty in walking, not elsewhere classified: Secondary | ICD-10-CM

## 2022-11-21 DIAGNOSIS — M5416 Radiculopathy, lumbar region: Secondary | ICD-10-CM

## 2022-11-21 DIAGNOSIS — M6281 Muscle weakness (generalized): Secondary | ICD-10-CM

## 2022-11-21 NOTE — Therapy (Signed)
OUTPATIENT PHYSICAL THERAPY THORACOLUMBAR TREATMENT   Patient Name: Shannon Russell MRN: [id number] DOB:25-May-1960, 62 y.o., female Today's Date: 11/21/2022  END OF SESSION: END OF SESSION:   PT End of Session - 11/21/22 1056     Visit Number 7    Number of Visits 12    Date for PT Re-Evaluation 12/06/22    Authorization Type [employer]    Authorization Time Period 12v from 10/29/22-12/28/22    Authorization - Visit Number 6    Authorization - Number of Visits 12    Progress Note Due on Visit 10    PT Start Time 1017    PT Stop Time 1057    PT Time Calculation (min) 40 min    Activity Tolerance Patient tolerated treatment well    Behavior During Therapy WFL for tasks assessed/performed              Past Medical History:  Diagnosis Date   Arthritis    Asthma    Cancer (HCC)    COPD (chronic obstructive pulmonary disease) (HCC)    Depression    Diabetes mellitus without complication (HCC)    H/O blood clots    PAD (peripheral artery disease) (HCC)    Thyroid disease    Past Surgical History:  Procedure Laterality Date   Bypass right leg  2015   CHOLECYSTECTOMY  1981   LOWER EXTREMITY ANGIOGRAPHY Right 02/20/2022   Procedure: Lower Extremity Angiography;  Surgeon: Renford Dills, MD;  Location: [facility] INVASIVE CV [facility];  Service: Cardiovascular;  Laterality: Right;   Mesh implant right leg  2015   THYROIDECTOMY  2013   TUBAL LIGATION  1993   Patient Active Problem List   Diagnosis Date Noted   Candidiasis of mouth 06/05/2022   Sinusitis 05/14/2022   Atherosclerosis of native arteries of extremity with intermittent claudication (HCC) 02/28/2022   Lymphedema 02/28/2022   Diabetes (HCC) 02/28/2022   Lactic acidosis 07/28/2021   COPD exacerbation (HCC) 07/27/2021   Acute renal failure superimposed on stage 2 chronic kidney disease (HCC) 07/27/2021   Obesity, Class I, BMI 30-34.9 07/27/2021   Hypothyroidism 07/27/2021   Frequent PVCs    Syncope  06/07/2020   Hypotension 06/07/2020   Hypomagnesemia 06/07/2020   Community acquired pneumonia 05/31/2020   Chronic respiratory failure with hypoxia (HCC) 05/31/2020   Syncope and collapse 05/31/2020   GERD (gastroesophageal reflux disease) 03/23/2020   Hyperlipidemia 03/23/2020   COPD (chronic obstructive pulmonary disease) (HCC) 04/20/2018   Nicotine dependence 04/20/2018   Type 2 diabetes mellitus with diabetic peripheral angiopathy without gangrene (HCC) 04/20/2018   PAD (peripheral artery disease) (HCC) 04/20/2018    PCP: Cam Hai, PA-C  REFERRING PROVIDER: Shelly Bombard  REFERRING DIAG: M54.16 (ICD-10-CM) - Lumbar radiculopathy  Rationale for Evaluation and Treatment: Rehabilitation  THERAPY DIAG:  Difficulty in walking, not elsewhere classified  Muscle weakness (generalized)  Radiculopathy, lumbar region  ONSET DATE: chronic a year and a half  SUBJECTIVE:  SUBJECTIVE STATEMENT:  Pt stated she has pain with Lt groin area, pain scale 8/10 with movement.    PERTINENT HISTORY:  PAD, OA   PAIN:  Are you having pain? Yes: NPRS scale:current: 5/10 Pain location:  Rt hip into her groin; sometimes she will have pain in her Lt back pt also has pain going down into  her LT butt cheek.  Pain description: sharp Aggravating factors: wt bearing  Relieving factors: sitting   PRECAUTIONS: Fall  RED FLAGS: None   WEIGHT BEARING RESTRICTIONS: No  FALLS:  Has patient fallen in last 6 months? No and Yes. Number of falls 10-12  LIVING ENVIRONMENT: Lives with: lives with their son Lives in: House/apartment Stairs: Yes: External: 3 steps; on left going up, has to go up and down sideways  Has following equipment at home: None  OCCUPATION: retired  PLOF: Independent with basic  ADLs  PATIENT GOALS: less pain, to be able to stand/walk longer   NEXT MD VISIT: 6 weeks   OBJECTIVE:  Note: Objective measures were completed at Evaluation unless otherwise noted.  DIAGNOSTIC FINDINGS:  No access to x-ray      POSTURE: No Significant postural limitations  LUMBAR ROM:   AROM eval  Flexion Fingers 4" from ground pain increased coming up   Extension 11; reps no change  Right lateral flexion   Left lateral flexion   Right rotation   Left rotation    (Blank rows = not tested)  LOWER EXTREMITY ROM:     Active  Right eval Left eval  Hip flexion    Hip extension    Hip abduction    Hip adduction    Hip internal rotation    Hip external rotation    Knee flexion    Knee extension    Ankle dorsiflexion    Ankle plantarflexion    Ankle inversion    Ankle eversion     (Blank rows = not tested)  LOWER EXTREMITY MMT:    MMT Right eval Left eval  Hip flexion 5 3  Hip extension 4- 1  Hip abduction 5 1+  Hip adduction    Hip internal rotation    Hip external rotation    Knee flexion 5 3+  Knee extension 5 4+  Ankle dorsiflexion 4 3+   Ankle plantarflexion    Ankle inversion    Ankle eversion     (Blank rows = not tested)  FUNCTIONAL TESTS:  30 seconds chair stand test: 7 x no UE noted all wt on RT leg 8 is poor 14 average for sex and age  82 minute walk test: 226" Single leg stance:  RT:  3" , LT:   0"   TODAY'S TREATMENT:                                                                                                                              DATE:  11/21/22 Supine:  Pubic clearing  prior to checking SI with Lt SI posteriorly rotated mm techniques to correct. Prone : Heel squeeze with glut set x 10 Opposite arm/leg raise with pillow under abdomen x 5  Side lying clam x 10  Side lying plank with bottom knee on table attempted unable; revised both knees bend and let pt push with both forearm and opposiste arm  5 reps x 5 seconds.   Supine: Table top LE dead bug x 10 Rechecked SI: with pubic clearing : good alignment Nustep:  level 3 x 7 minutes Standing: Hip excursion x 3 Heel raise x 10 Functional squat x 10     PATIENT EDUCATION:  Education details: HEP Person educated: Patient Education method: Explanation, Verbal cues, and Handouts Education comprehension: returned demonstration  HOME EXERCISE PROGRAM: Access Code: [email or web address] URL: [email or web address] Date: 10/25/2022 Prepared by: Virgina Organ  Exercises - Supine Bridge  - 2 x daily - 7 x weekly - 1 sets - 10 reps - 3-5 seconds  hold - Bent Knee Fallouts  - 2 x daily - 7 x weekly - 1 sets - 10 reps - 3" hold - Sit to Stand  - 2 x daily - 7 x weekly - 1 sets - 10 reps Access Code: [email or web address] URL: [email or web address] Date: 10/30/2022 Prepared by: Starling Manns  Exercises - Clamshell with Resistance  - 1 x daily - 7 x weekly - 3 sets - 10 reps - Tandem Stance  - 1 x daily - 7 x weekly - 3 sets - 10 reps - Seated Anti-Rotation Press With Anchored [place] Supine Transversus Abdominis Bracing - Hands on Stomach  - 3 x daily - 7 x weekly - 1 sets - 10 reps - 5 hold 10/29  Hooklying Small March  - 3 x daily - 7 x weekly - 1 sets - 10 reps - 5 holdce  - 1 x daily - 7 x weekly - 3 sets - 10 reps - Supine Bridge  - 1 x daily - 7 x weekly - 3 sets - 10 reps 11/14/22 - Supine Single Knee to Chest Stretch  - 2 x daily - 7 x weekly - 1 sets - 3 reps - 30" hold - Supine Hamstring Stretch  - 2 x daily - 7 x weekly - 1 sets - 3 reps - 30" hold - Standing Lumbar Extension  - 3 x daily - 7 x weekly - 1 sets - 10 reps - 2" hold ASSESSMENT:  CLINICAL IMPRESSION:  Therapist checked SI which needed adjustment.  Good adjustment noted following exercise session which continues to focus on strengthening core mm. Pt unable to perform a side lying plank but able to perform a modified plank.  PT  able to demonstrate good body mechanics for  bed mobility and lifting.  OBJECTIVE IMPAIRMENTS: decreased activity tolerance, decreased balance, decreased mobility, difficulty walking, decreased ROM, decreased strength, and pain.   ACTIVITY LIMITATIONS: carrying, lifting, bending, standing, squatting, stairs, and locomotion level  PARTICIPATION LIMITATIONS: meal prep, cleaning, laundry, shopping, and community activity  PERSONAL FACTORS: Fitness, Past/current experiences, and Time since onset of injury/illness/exacerbation are also affecting patient's functional outcome.   REHAB POTENTIAL: Good  CLINICAL DECISION MAKING: Evolving/moderate complexity  EVALUATION COMPLEXITY: Moderate   GOALS: Goals reviewed with patient? No  SHORT TERM GOALS: Target date: 11/15/22  Pt to be I in HEP in order to decrease her pain to no greater than a 7/10 Baseline: Goal status: on-going   2.  Pt strength of core and LE mm  to increase 1/2 grade to allow pt to be able to rise from a couch and get in and out of a car with greater ease.  Baseline:  Goal status:on-going   3.  Pt to be able to walk for four minutes  Baseline:  Goal status:on-going    LONG TERM GOALS: Target date: 12/06/22  Pt to be I in an advanced HEP in order to decrease her pain to no greater than a 5/10 Baseline:  Goal status: on-going   2.  Pt strength of core and LE mm to increase 1 grade to allow pt to go up and down steps in a reciprocal manner.   Baseline:  Goal status: on-going   3.  PT to be able to stand/walk for 15 minutes to make a small meal. Baseline:  Goal status: on-going   PLAN:  PT FREQUENCY: 2x/week  PT DURATION: 6 weeks  PLANNED INTERVENTIONS: 97110-Therapeutic exercises, 97530- Therapeutic activity, 97112- Neuromuscular re-education, 97535- Self Care, and 28413- Manual therapy.  PLAN FOR NEXT SESSION: stretching and  stabilization exercises.  Check leg length, MET PRN  Virgina Organ, PT CLT [phone]  11/21/2022, 10:57 AM

## 2022-11-26 ENCOUNTER — Telehealth (INDEPENDENT_AMBULATORY_CARE_PROVIDER_SITE_OTHER): Payer: Self-pay

## 2022-11-26 ENCOUNTER — Ambulatory Visit (HOSPITAL_COMMUNITY): Payer: Medicaid Other

## 2022-11-26 ENCOUNTER — Encounter (INDEPENDENT_AMBULATORY_CARE_PROVIDER_SITE_OTHER): Payer: Self-pay | Admitting: Nurse Practitioner

## 2022-11-26 ENCOUNTER — Encounter (HOSPITAL_COMMUNITY): Payer: Self-pay

## 2022-11-26 DIAGNOSIS — R262 Difficulty in walking, not elsewhere classified: Secondary | ICD-10-CM

## 2022-11-26 DIAGNOSIS — M5416 Radiculopathy, lumbar region: Secondary | ICD-10-CM

## 2022-11-26 DIAGNOSIS — M6281 Muscle weakness (generalized): Secondary | ICD-10-CM

## 2022-11-26 NOTE — Telephone Encounter (Signed)
She needs ABIs, she didn't have a DVT she an arterial occlusion

## 2022-11-26 NOTE — Therapy (Signed)
[facility] Outpatient Rehabilitation at [facility] [address] [place], [place], [address] Phone: [phone]   Fax:  [phone]  Patient Details  Name: Shannon Russell MRN: [id number] Date of Birth: 21-Sep-1960 Referring Provider:  [facility]*  Encounter Date: 11/26/2022   Juel Burrow, PTA 11/26/2022, 11:26 AM At entrance pt continues to have pain Lt groin and c/o Lt thigh soreness, pain scale 8/10 today.  Pt c/o increased pain in Rt calf and reports of increased burning following 227ft gait at beginning of session.  Pt stated it feels the same as when she had 2 blood clots in the past.  Pt with history of PAD.  Upon examination no redness of heat.  Reports of increased pain with calf squeeze during Homans testing.  Held PT session following symptoms and strongly encouraged to seek medical attention.    Becky Sax, LPTA/CLT; CBIS [phone]  Juel Burrow, PTA 11/26/2022, 11:27 AM  [facility] Outpatient Rehabilitation at [facility] [address] [place], [place], [address] Phone: [phone]   Fax:  [phone]

## 2022-11-26 NOTE — Telephone Encounter (Signed)
Patient left a message stating that she was having burning sensation on the back of her right leg for past 3 days. Patient states she had the same symptoms during previous dvt. Patient can hardly walk due to the pain. Patient was last seen 03/12/2022.Please Advise

## 2022-11-27 ENCOUNTER — Other Ambulatory Visit: Payer: Self-pay | Admitting: Cardiovascular Disease

## 2022-11-27 ENCOUNTER — Ambulatory Visit (INDEPENDENT_AMBULATORY_CARE_PROVIDER_SITE_OTHER): Payer: Medicaid Other

## 2022-11-27 DIAGNOSIS — I70219 Atherosclerosis of native arteries of extremities with intermittent claudication, unspecified extremity: Secondary | ICD-10-CM

## 2022-11-27 DIAGNOSIS — I10 Essential (primary) hypertension: Secondary | ICD-10-CM

## 2022-11-29 ENCOUNTER — Encounter (HOSPITAL_COMMUNITY): Payer: Medicaid Other | Admitting: Physical Therapy

## 2022-12-04 LAB — VAS US ABI WITH/WO TBI
Left ABI: 1
Right ABI: 0.47

## 2022-12-05 ENCOUNTER — Telehealth: Payer: Self-pay

## 2022-12-05 NOTE — Telephone Encounter (Signed)
Patient called requesting for you to send in an Rx for the last antibiotic she had. She is sick with cold and sinus. Please advise.

## 2022-12-11 ENCOUNTER — Ambulatory Visit: Payer: Medicaid Other | Admitting: Cardiology

## 2022-12-13 ENCOUNTER — Ambulatory Visit (INDEPENDENT_AMBULATORY_CARE_PROVIDER_SITE_OTHER): Payer: Medicaid Other | Admitting: Vascular Surgery

## 2022-12-13 ENCOUNTER — Other Ambulatory Visit: Payer: Self-pay | Admitting: General Practice

## 2022-12-16 ENCOUNTER — Other Ambulatory Visit: Payer: Self-pay | Admitting: Cardiology

## 2022-12-18 ENCOUNTER — Ambulatory Visit: Payer: Medicaid Other | Admitting: Cardiology

## 2022-12-18 ENCOUNTER — Encounter: Payer: Self-pay | Admitting: Cardiology

## 2022-12-18 VITALS — BP 102/68 | HR 84 | Ht 63.0 in | Wt 177.8 lb

## 2022-12-18 DIAGNOSIS — E039 Hypothyroidism, unspecified: Secondary | ICD-10-CM | POA: Diagnosis not present

## 2022-12-18 DIAGNOSIS — E1151 Type 2 diabetes mellitus with diabetic peripheral angiopathy without gangrene: Secondary | ICD-10-CM

## 2022-12-18 DIAGNOSIS — E1159 Type 2 diabetes mellitus with other circulatory complications: Secondary | ICD-10-CM | POA: Diagnosis not present

## 2022-12-18 DIAGNOSIS — Z0130 Encounter for examination of blood pressure without abnormal findings: Secondary | ICD-10-CM

## 2022-12-18 DIAGNOSIS — J449 Chronic obstructive pulmonary disease, unspecified: Secondary | ICD-10-CM

## 2022-12-18 DIAGNOSIS — E782 Mixed hyperlipidemia: Secondary | ICD-10-CM | POA: Diagnosis not present

## 2022-12-18 MED ORDER — LEVOTHYROXINE SODIUM 100 MCG PO TABS: 100.0000 ug | ORAL_TABLET | Freq: Every day | ORAL | 3 refills | Status: DC

## 2022-12-18 MED ORDER — ALBUTEROL SULFATE HFA 108 (90 BASE) MCG/ACT IN AERS: 2.0000 | INHALATION_SPRAY | Freq: Four times a day (QID) | RESPIRATORY_TRACT | 4 refills | Status: AC | PRN

## 2022-12-18 MED ORDER — FEXOFENADINE HCL 180 MG PO TABS: 180.0000 mg | ORAL_TABLET | Freq: Every morning | ORAL | 3 refills | Status: DC

## 2022-12-18 MED ORDER — IPRATROPIUM-ALBUTEROL 0.5-2.5 (3) MG/3ML IN SOLN: 3.0000 mL | Freq: Four times a day (QID) | RESPIRATORY_TRACT | 6 refills | Status: AC | PRN

## 2022-12-18 MED ORDER — MONTELUKAST SODIUM 10 MG PO TABS: 10.0000 mg | ORAL_TABLET | Freq: Every day | ORAL | 2 refills | Status: DC

## 2022-12-18 MED ORDER — SPIRIVA HANDIHALER 18 MCG IN CAPS: 18.0000 ug | ORAL_CAPSULE | Freq: Every day | RESPIRATORY_TRACT | 4 refills | Status: DC

## 2022-12-18 MED ORDER — BACLOFEN 10 MG PO TABS: 10.0000 mg | ORAL_TABLET | Freq: Two times a day (BID) | ORAL | 3 refills | Status: DC | PRN

## 2022-12-18 MED ORDER — BUDESONIDE 0.5 MG/2ML IN SUSP: 1.0000 mg | Freq: Two times a day (BID) | RESPIRATORY_TRACT | 2 refills | Status: DC

## 2022-12-18 MED ORDER — TRELEGY ELLIPTA 100-62.5-25 MCG/ACT IN AEPB: 1.0000 | INHALATION_SPRAY | Freq: Every day | RESPIRATORY_TRACT | 12 refills | Status: DC

## 2022-12-18 MED ORDER — PANTOPRAZOLE SODIUM 40 MG PO TBEC: 40.0000 mg | DELAYED_RELEASE_TABLET | Freq: Two times a day (BID) | ORAL | 3 refills | Status: DC

## 2022-12-18 MED ORDER — CLONIDINE HCL 0.1 MG PO TABS: 0.1000 mg | ORAL_TABLET | Freq: Every day | ORAL | 3 refills | Status: AC

## 2022-12-18 MED ORDER — ATORVASTATIN CALCIUM 80 MG PO TABS: 80.0000 mg | ORAL_TABLET | Freq: Every day | ORAL | 3 refills | Status: DC

## 2022-12-18 MED ORDER — CHOLECALCIFEROL 1.25 MG (50000 UT) PO CAPS: ORAL_CAPSULE | ORAL | 3 refills | Status: DC

## 2022-12-18 MED ORDER — LEVOFLOXACIN 250 MG PO TABS: 250.0000 mg | ORAL_TABLET | Freq: Every day | ORAL | 0 refills | Status: AC

## 2022-12-18 MED ORDER — LANTUS SOLOSTAR 100 UNIT/ML ~~LOC~~ SOPN: 18.0000 [IU] | PEN_INJECTOR | Freq: Every day | SUBCUTANEOUS | 6 refills | Status: DC

## 2022-12-18 MED ORDER — METFORMIN HCL 1000 MG PO TABS: 1000.0000 mg | ORAL_TABLET | Freq: Every morning | ORAL | 3 refills | Status: AC

## 2022-12-18 MED ORDER — TRIAMCINOLONE ACETONIDE 0.5 % EX OINT: 1.0000 | TOPICAL_OINTMENT | Freq: Two times a day (BID) | CUTANEOUS | 0 refills | Status: DC

## 2022-12-18 NOTE — Progress Notes (Signed)
Established Patient Office Visit  Subjective:  Patient ID: Shannon Russell, female    DOB: 1960-03-11  Age: 62 y.o. MRN: [id number]  Chief Complaint  Patient presents with   Follow-up    3 month follow up    Patient in office for 3 month follow up. Did not have blood work done. Will return when fasting. Overall patient feeling well, no new complaints today.  Patient continues to smoke, understands risks and is not wanting to quit right now.     No other concerns at this time.   Past Medical History:  Diagnosis Date   Arthritis    Asthma    Cancer (HCC)    COPD (chronic obstructive pulmonary disease) (HCC)    Depression    Diabetes mellitus without complication (HCC)    H/O blood clots    PAD (peripheral artery disease) (HCC)    Thyroid disease     Past Surgical History:  Procedure Laterality Date   Bypass right leg  2015   CHOLECYSTECTOMY  1981   LOWER EXTREMITY ANGIOGRAPHY Right 02/20/2022   Procedure: Lower Extremity Angiography;  Surgeon: Renford Dills, MD;  Location: [facility] INVASIVE CV LAB;  Service: Cardiovascular;  Laterality: Right;   Mesh implant right leg  2015   THYROIDECTOMY  2013   TUBAL LIGATION  1993    Social History   Socioeconomic History   Marital status: Legally Separated    Spouse name: Not on file   Number of children: Not on file   Years of education: Not on file   Highest education level: Not on file  Occupational History   Not on file  Tobacco Use   Smoking status: Every Day    Current packs/day: 0.50    Average packs/day: 0.5 packs/day for 45.0 years (22.5 ttl pk-yrs)    Types: Cigarettes   Smokeless tobacco: Never   Tobacco comments:    6 ciggs a day.  Vaping Use   Vaping status: Never Used  Substance and Sexual Activity   Alcohol use: Not Currently   Drug use: Not Currently   Sexual activity: Not on file  Other Topics Concern   Not on file  Social History Narrative   Not on file   Social Determinants of Health    Financial Resource Strain: Not on file  Food Insecurity: Not on file  Transportation Needs: Not on file  Physical Activity: Not on file  Stress: Not on file  Social Connections: Not on file  Intimate Partner Violence: Not on file    Family History  Problem Relation Age of Onset   Heart failure Mother    Depression Mother    Peripheral Artery Disease Sister    Cancer Sister    Clotting disorder Sister    Depression Sister    Thyroid disease Sister     Allergies  Allergen Reactions   Iobenguane I 131 Hives   Advair Diskus [Fluticasone-Salmeterol] Hives   Iodine Hives and Rash    Other reaction(s): Skin Rashes, Hives  Pt states she need premedication for contrast exposure    Outpatient Medications Prior to Visit  Medication Sig   Accu-Chek Softclix Lancets lancets Use as instructed   aspirin EC 81 MG tablet Take 81 mg by mouth daily. Swallow whole.   clopidogrel (PLAVIX) 75 MG tablet Take 1 tablet by mouth once daily   furosemide (LASIX) 40 MG tablet Take 1 tablet by mouth once daily   glucose blood test strip Use as instructed  Insulin Pen Needle (B-D UF III MINI PEN NEEDLES) 31G X 5 MM MISC USE AS DIRECTED   lisinopril (ZESTRIL) 5 MG tablet Take 1 tablet by mouth once daily   methocarbamol (ROBAXIN) 500 MG tablet Take 1 tablet by mouth every 8 (eight) hours.   ofloxacin (FLOXIN) 0.3 % OTIC solution Place 5 drops into both ears 2 (two) times daily.   pregabalin (LYRICA) 75 MG capsule Take 1 capsule (75 mg total) by mouth 3 (three) times daily.   temazepam (RESTORIL) 15 MG capsule Take 15 mg by mouth at bedtime as needed.   [DISCONTINUED] albuterol (PROVENTIL HFA;VENTOLIN HFA) 108 (90 Base) MCG/ACT inhaler Inhale 2 puffs into the lungs every 6 (six) hours as needed for wheezing.   [DISCONTINUED] atorvastatin (LIPITOR) 80 MG tablet Take 1 tablet (80 mg total) by mouth daily.   [DISCONTINUED] baclofen (LIORESAL) 10 MG tablet Take 1 tablet (10 mg total) by mouth every 12  (twelve) hours as needed for muscle spasms.   [DISCONTINUED] budesonide (PULMICORT) 0.5 MG/2ML nebulizer solution Take 4 mLs (1 mg total) by nebulization 2 (two) times daily.   [DISCONTINUED] Cholecalciferol 1.25 MG (50000 UT) capsule Vitamin D3 1.25 MG (50000 UT) Oral Capsule QTY: 0 capsule Days: 0 Refills: 0  Written: 02/09/20 Patient Instructions: qaw   [DISCONTINUED] cloNIDine (CATAPRES) 0.1 MG tablet Take 1 tablet (0.1 mg total) by mouth at bedtime.   [DISCONTINUED] fexofenadine (ALLEGRA) 180 MG tablet Take 180 mg by mouth every morning.   [DISCONTINUED] insulin glargine (LANTUS SOLOSTAR) 100 UNIT/ML Solostar Pen Inject 18 Units into the skin at bedtime.   [DISCONTINUED] ipratropium-albuterol (DUONEB) 0.5-2.5 (3) MG/3ML SOLN Take 3 mLs by nebulization every 6 (six) hours as needed.   [DISCONTINUED] levothyroxine (SYNTHROID) 100 MCG tablet Take 1 tablet (100 mcg total) by mouth daily before breakfast.   [DISCONTINUED] metFORMIN (GLUCOPHAGE) 1000 MG tablet Take 1 tablet (1,000 mg total) by mouth every morning.   [DISCONTINUED] montelukast (SINGULAIR) 10 MG tablet Take 1 tablet (10 mg total) by mouth at bedtime.   [DISCONTINUED] pantoprazole (PROTONIX) 40 MG tablet Take 1 tablet (40 mg total) by mouth 2 (two) times daily.   [DISCONTINUED] SPIRIVA HANDIHALER 18 MCG inhalation capsule daily.   [DISCONTINUED] TRELEGY ELLIPTA 100-62.5-25 MCG/ACT AEPB SMARTSIG:1 Puff(s) Via Inhaler Every Morning   [DISCONTINUED] fluconazole (DIFLUCAN) 150 MG tablet Take 1 tablet (150 mg total) by mouth daily. (Patient not taking: Reported on 12/18/2022)   [DISCONTINUED] nicotine (NICODERM CQ - DOSED IN MG/24 HOURS) 14 mg/24hr patch Place 1 patch (14 mg total) onto the skin daily. (Patient not taking: Reported on 12/18/2022)   No facility-administered medications prior to visit.    Review of Systems  Constitutional: Negative.   HENT: Negative.    Eyes: Negative.   Respiratory: Negative.  Negative for shortness of  breath.   Cardiovascular: Negative.  Negative for chest pain.  Gastrointestinal: Negative.  Negative for abdominal pain, constipation and diarrhea.  Genitourinary: Negative.   Musculoskeletal:  Negative for joint pain and myalgias.  Skin: Negative.   Neurological: Negative.  Negative for dizziness and headaches.  Endo/Heme/Allergies: Negative.   All other systems reviewed and are negative.      Objective:   BP 102/68   Pulse 84   Ht 5\' 3"  (1.6 m)   Wt 177 lb 12.8 oz (80.6 kg)   SpO2 96%   BMI 31.50 kg/m   Vitals:   12/18/22 1407  BP: 102/68  Pulse: 84  Height: 5\' 3"  (1.6 m)  Weight: 177  lb 12.8 oz (80.6 kg)  SpO2: 96%  BMI (Calculated): 31.5    Physical Exam Vitals and nursing note reviewed.  Constitutional:      Appearance: Normal appearance. She is normal weight.  HENT:     Head: Normocephalic and atraumatic.     Nose: Nose normal.     Mouth/Throat:     Mouth: Mucous membranes are moist.  Eyes:     Extraocular Movements: Extraocular movements intact.     Conjunctiva/sclera: Conjunctivae normal.     Pupils: Pupils are equal, round, and reactive to light.  Cardiovascular:     Rate and Rhythm: Normal rate and regular rhythm.     Pulses: Normal pulses.     Heart sounds: Normal heart sounds.  Pulmonary:     Effort: Pulmonary effort is normal.     Breath sounds: Stridor present. Wheezing present.  Abdominal:     General: Abdomen is flat. Bowel sounds are normal.     Palpations: Abdomen is soft.  Musculoskeletal:        General: Normal range of motion.     Cervical back: Normal range of motion.  Skin:    General: Skin is warm and dry.  Neurological:     General: No focal deficit present.     Mental Status: She is alert and oriented to person, place, and time.  Psychiatric:        Mood and Affect: Mood normal.        Behavior: Behavior normal.        Thought Content: Thought content normal.        Judgment: Judgment normal.      No results found for  any visits on 12/18/22.  Recent Results (from the past 2160 hour(s))  VAS [place] ABI WITH/WO TBI     Status: None   Collection Time: 11/27/22  1:57 PM  Result Value Ref Range   Right ABI 0.47    Left ABI 1.00       Assessment & Plan:  Return for fasting lab work.  Smoking cessation.   Problem List Items Addressed This Visit       Cardiovascular and Mediastinum   Type 2 diabetes mellitus with diabetic peripheral angiopathy without gangrene (HCC) - Primary   Relevant Medications   atorvastatin (LIPITOR) 80 MG tablet   cloNIDine (CATAPRES) 0.1 MG tablet   insulin glargine (LANTUS SOLOSTAR) 100 UNIT/ML Solostar Pen   metFORMIN (GLUCOPHAGE) 1000 MG tablet   Other Relevant Orders   CMP14+EGFR   Hemoglobin A1c     Respiratory   COPD (chronic obstructive pulmonary disease) (HCC)   Relevant Medications   albuterol (VENTOLIN HFA) 108 (90 Base) MCG/ACT inhaler   budesonide (PULMICORT) 0.5 MG/2ML nebulizer solution   fexofenadine (ALLEGRA) 180 MG tablet   ipratropium-albuterol (DUONEB) 0.5-2.5 (3) MG/3ML SOLN   montelukast (SINGULAIR) 10 MG tablet   SPIRIVA HANDIHALER 18 MCG inhalation capsule   TRELEGY ELLIPTA 100-62.5-25 MCG/ACT AEPB   Other Relevant Orders   CBC with Differential/Platelet     Endocrine   Hypothyroidism (Chronic)   Relevant Medications   levothyroxine (SYNTHROID) 100 MCG tablet   Other Relevant Orders   TSH   Diabetes (HCC)   Relevant Medications   atorvastatin (LIPITOR) 80 MG tablet   insulin glargine (LANTUS SOLOSTAR) 100 UNIT/ML Solostar Pen   metFORMIN (GLUCOPHAGE) 1000 MG tablet   Other Relevant Orders   CMP14+EGFR   Hemoglobin A1c     Other   Hyperlipidemia   Relevant Medications  atorvastatin (LIPITOR) 80 MG tablet   cloNIDine (CATAPRES) 0.1 MG tablet   Other Relevant Orders   Lipid Profile   CMP14+EGFR    Return in about 3 months (around 03/18/2023).   Total time spent: 25 minutes  [employer], NP  12/18/2022   This document  may have been prepared by Dragon Voice Recognition software and as such may include unintentional dictation errors.

## 2022-12-19 ENCOUNTER — Other Ambulatory Visit: Payer: Self-pay

## 2022-12-20 ENCOUNTER — Ambulatory Visit (INDEPENDENT_AMBULATORY_CARE_PROVIDER_SITE_OTHER): Payer: Medicaid Other | Admitting: Vascular Surgery

## 2022-12-20 ENCOUNTER — Encounter (INDEPENDENT_AMBULATORY_CARE_PROVIDER_SITE_OTHER): Payer: Self-pay | Admitting: Vascular Surgery

## 2022-12-20 VITALS — BP 101/66 | HR 71 | Resp 18 | Ht 63.0 in | Wt 177.0 lb

## 2022-12-20 DIAGNOSIS — I70229 Atherosclerosis of native arteries of extremities with rest pain, unspecified extremity: Secondary | ICD-10-CM | POA: Insufficient documentation

## 2022-12-20 DIAGNOSIS — E782 Mixed hyperlipidemia: Secondary | ICD-10-CM | POA: Diagnosis not present

## 2022-12-20 DIAGNOSIS — J449 Chronic obstructive pulmonary disease, unspecified: Secondary | ICD-10-CM | POA: Diagnosis not present

## 2022-12-20 DIAGNOSIS — I70221 Atherosclerosis of native arteries of extremities with rest pain, right leg: Secondary | ICD-10-CM

## 2022-12-20 DIAGNOSIS — E1151 Type 2 diabetes mellitus with diabetic peripheral angiopathy without gangrene: Secondary | ICD-10-CM

## 2022-12-20 DIAGNOSIS — K219 Gastro-esophageal reflux disease without esophagitis: Secondary | ICD-10-CM

## 2022-12-20 NOTE — Progress Notes (Signed)
MRN : [id number]  Shannon Russell is a 62 y.o. (1960-04-20) female who presents with chief complaint of check circulation.  History of Present Illness:   The patient returns to the office for followup and review status post angiogram with intervention on 02/20/2022.    Procedure:  Percutaneous transluminal angioplasty right superficial femoral and popliteal artery. 2.    Percutaneous transluminal angioplasty and stent placement left common iliac artery with an 8 x 37 Lifestream stent.     The patient notes that there has been a significant deterioration in the lower extremity symptoms.  The patient notes interval shortening of their claudication distance and development of mild rest pain symptoms. No new ulcers or wounds have occurred since the last visit.  There have been no significant changes to the patient's overall health care.  The patient denies amaurosis fugax or recent TIA symptoms. There are no recent neurological changes noted. There is no history of DVT, PE or superficial thrombophlebitis. The patient denies recent episodes of angina or shortness of breath.   ABI's Rt=0.47 and Lt=1.00 (previous ABI's Rt=0.70 and Lt=1.03) Duplex US of the right lower extremity arterial system shows tandem >75% stenosis.    Current Meds  Medication Sig   Accu-Chek Softclix Lancets lancets Use as instructed   albuterol (VENTOLIN HFA) 108 (90 Base) MCG/ACT inhaler Inhale 2 puffs into the lungs every 6 (six) hours as needed for wheezing.   aspirin EC 81 MG tablet Take 81 mg by mouth daily. Swallow whole.   atorvastatin (LIPITOR) 80 MG tablet Take 1 tablet (80 mg total) by mouth daily.   baclofen (LIORESAL) 10 MG tablet Take 1 tablet (10 mg total) by mouth every 12 (twelve) hours as needed for muscle spasms.   budesonide (PULMICORT) 0.5 MG/2ML nebulizer solution Take 4 mLs (1 mg total) by nebulization 2 (two) times daily.    Cholecalciferol 1.25 MG (50000 UT) capsule Vitamin D3 1.25 MG (50000 UT) Oral Capsule QTY: 0 capsule Days: 0 Refills: 0  Written: 02/09/20 Patient Instructions: qaw   cloNIDine (CATAPRES) 0.1 MG tablet Take 1 tablet (0.1 mg total) by mouth at bedtime.   clopidogrel (PLAVIX) 75 MG tablet Take 1 tablet by mouth once daily   fexofenadine (ALLEGRA) 180 MG tablet Take 1 tablet (180 mg total) by mouth every morning.   furosemide (LASIX) 40 MG tablet Take 1 tablet by mouth once daily   glucose blood test strip Use as instructed   insulin glargine (LANTUS SOLOSTAR) 100 UNIT/ML Solostar Pen Inject 18 Units into the skin at bedtime.   Insulin Pen Needle (B-D UF III MINI PEN NEEDLES) 31G X 5 MM MISC USE AS DIRECTED   ipratropium-albuterol (DUONEB) 0.5-2.5 (3) MG/3ML SOLN Take 3 mLs by nebulization every 6 (six) hours as needed.   levofloxacin (LEVAQUIN) 250 MG tablet Take 1 tablet (250 mg total) by mouth daily for 7 days.   levothyroxine (SYNTHROID) 100 MCG tablet Take 1 tablet (100 mcg total) by mouth daily before breakfast.   lisinopril (ZESTRIL) 5 MG tablet Take 1 tablet by mouth once daily   metFORMIN (GLUCOPHAGE) 1000 MG  tablet Take 1 tablet (1,000 mg total) by mouth every morning.   methocarbamol (ROBAXIN) 500 MG tablet Take 1 tablet by mouth every 8 (eight) hours.   montelukast (SINGULAIR) 10 MG tablet Take 1 tablet (10 mg total) by mouth at bedtime.   ofloxacin (FLOXIN) 0.3 % OTIC solution Place 5 drops into both ears 2 (two) times daily.   pantoprazole (PROTONIX) 40 MG tablet Take 1 tablet (40 mg total) by mouth 2 (two) times daily.   pregabalin (LYRICA) 75 MG capsule Take 1 capsule (75 mg total) by mouth 3 (three) times daily.   SPIRIVA HANDIHALER 18 MCG inhalation capsule Place 1 capsule (18 mcg total) into inhaler and inhale daily.   temazepam (RESTORIL) 15 MG capsule Take 15 mg by mouth at bedtime as needed.   TRELEGY ELLIPTA 100-62.5-25 MCG/ACT AEPB Inhale 1 puff into the lungs daily.    triamcinolone ointment (KENALOG) 0.5 % Apply 1 Application topically 2 (two) times daily.    Past Medical History:  Diagnosis Date   Arthritis    Asthma    Cancer (HCC)    COPD (chronic obstructive pulmonary disease) (HCC)    Depression    Diabetes mellitus without complication (HCC)    H/O blood clots    PAD (peripheral artery disease) (HCC)    Thyroid disease     Past Surgical History:  Procedure Laterality Date   Bypass right leg  2015   CHOLECYSTECTOMY  1981   LOWER EXTREMITY ANGIOGRAPHY Right 02/20/2022   Procedure: Lower Extremity Angiography;  Surgeon: Renford Dills, MD;  Location: [facility] INVASIVE CV LAB;  Service: Cardiovascular;  Laterality: Right;   Mesh implant right leg  2015   THYROIDECTOMY  2013   TUBAL LIGATION  1993    Social History Social History   Tobacco Use   Smoking status: Every Day    Current packs/day: 0.50    Average packs/day: 0.5 packs/day for 45.0 years (22.5 ttl pk-yrs)    Types: Cigarettes   Smokeless tobacco: Never   Tobacco comments:    6 ciggs a day.  Vaping Use   Vaping status: Never Used  Substance Use Topics   Alcohol use: Not Currently   Drug use: Not Currently    Family History Family History  Problem Relation Age of Onset   Heart failure Mother    Depression Mother    Peripheral Artery Disease Sister    Cancer Sister    Clotting disorder Sister    Depression Sister    Thyroid disease Sister     Allergies  Allergen Reactions   Iobenguane I 131 Hives   Advair Diskus [Fluticasone-Salmeterol] Hives   Iodine Hives and Rash    Other reaction(s): Skin Rashes, Hives  Pt states she need premedication for contrast exposure     REVIEW OF SYSTEMS (Negative unless checked)  Constitutional: [] Weight loss  [] Fever  [] Chills Cardiac: [] Chest pain   [] Chest pressure   [] Palpitations   [] Shortness of breath when laying flat   [] Shortness of breath with exertion. Vascular:  [x] Pain in legs with walking   [] Pain in legs at  rest  [] History of DVT   [] Phlebitis   [] Swelling in legs   [] Varicose veins   [] Non-healing ulcers Pulmonary:   [] Uses home oxygen   [] Productive cough   [] Hemoptysis   [] Wheeze  [] COPD   [] Asthma Neurologic:  [x] Dizziness   [] Seizures   [] History of stroke   [] History of TIA  [] Aphasia   [] Vissual changes   [] Weakness  or numbness in arm   [] Weakness or numbness in leg Musculoskeletal:   [] Joint swelling   [x] Joint pain   [] Low back pain Hematologic:  [] Easy bruising  [] Easy bleeding   [] Hypercoagulable state   [] Anemic Gastrointestinal:  [] Diarrhea   [] Vomiting  [x] Gastroesophageal reflux/heartburn   [] Difficulty swallowing. Genitourinary:  [x] Chronic kidney disease   [] Difficult urination  [] Frequent urination   [] Blood in urine Skin:  [] Rashes   [] Ulcers  Psychological:  [] History of anxiety   []  History of major depression.  Physical Examination  There were no vitals filed for this visit. There is no height or weight on file to calculate BMI. Gen: WD/WN, NAD Head: [place]/AT, No temporalis wasting.  Ear/Nose/Throat: Hearing grossly intact, nares w/o erythema or drainage Eyes: PER, EOMI, sclera nonicteric.  Neck: Supple, no masses.  No bruit or JVD.  Pulmonary:  Good air movement, no audible wheezing, no use of accessory muscles.  Cardiac: RRR, normal S1, S2, no Murmurs. Vascular:  mild trophic changes, no open wounds Vessel Right Left  Radial Palpable Palpable  PT Not Palpable Not Palpable  DP Not Palpable Not Palpable  Gastrointestinal: soft, non-distended. No guarding/no peritoneal signs.  Musculoskeletal: M/S 5/5 throughout.  No visible deformity.  Neurologic: CN 2-12 intact. Pain and light touch intact in extremities.  Symmetrical.  Speech is fluent. Motor exam as listed above. Psychiatric: Judgment intact, Mood & affect appropriate for pt's clinical situation. Dermatologic: No rashes or ulcers noted.  No changes consistent with cellulitis.   CBC Lab Results  Component Value  Date   WBC 10.5 09/06/2022   HGB 12.9 09/06/2022   HCT 39.0 09/06/2022   MCV 89 09/06/2022   PLT 281 09/06/2022    BMET    Component Value Date/Time   NA 137 09/06/2022 1324   K 5.0 09/06/2022 1324   CL 103 09/06/2022 1324   CO2 21 09/06/2022 1324   GLUCOSE 139 (H) 09/06/2022 1324   GLUCOSE 335 (H) 07/29/2021 0409   BUN 32 (H) 09/06/2022 1324   CREATININE 1.33 (H) 09/06/2022 1324   CALCIUM 9.4 09/06/2022 1324   GFRNONAA 44 (L) 02/20/2022 0744   GFRAA >60 01/20/2019 0529   CrCl cannot be calculated (Patient's most recent lab result is older than the maximum 21 days allowed.).  COAG No results found for: "INR", "PROTIME"  Radiology VAS [place] LOWER EXTREMITY ARTERIAL DUPLEX Result Date: 12/04/2022 LOWER EXTREMITY ARTERIAL DUPLEX STUDY Patient Name:  DAWNEL BIVEN  Date of Exam:   11/27/2022 Medical Rec #: [id number]      Accession #:    [id number] Date of Birth: [birth year]      Patient Gender: F Patient Age:   2 years Exam Location:  [facility] Procedure:      VAS [place] LOWER EXTREMITY ARTERIAL DUPLEX Referring Phys: Levora Dredge --------------------------------------------------------------------------------  Indications: Peripheral artery disease. High Risk Factors: Hypertension, hyperlipidemia, Diabetes, current smoker.  Vascular Interventions: 02/20/2022: PTA Right SFA and Popliteal Artery. PTA and                         Stent placement CIA with an 8 x 37 Lifestream stent. Current ABI:            RIght: 0.47, Left: 1.0 Performing Technologist: Hardie Lora RVT  Examination Guidelines: A complete evaluation includes B-mode imaging, spectral Doppler, color Doppler, and power Doppler as needed of all accessible portions of each vessel. Bilateral testing is considered an integral part of a complete  examination. Limited examinations for reoccurring indications may be performed as noted.  +----------+--------+-----+---------------+----------+--------+ RIGHT     PSV  cm/sRatioStenosis       Waveform  Comments +----------+--------+-----+---------------+----------+--------+ CFA Distal116                         monophasic         +----------+--------+-----+---------------+----------+--------+ DFA       54                          monophasic         +----------+--------+-----+---------------+----------+--------+ SFA Prox  70                          monophasic         +----------+--------+-----+---------------+----------+--------+ SFA Mid   101                         monophasic         +----------+--------+-----+---------------+----------+--------+ SFA Distal308          50-74% stenosismonophasic         +----------+--------+-----+---------------+----------+--------+ POP Prox  348          75-99% stenosismonophasic         +----------+--------+-----+---------------+----------+--------+ POP Distal27                          monophasic         +----------+--------+-----+---------------+----------+--------+ ATA Distal18                          monophasic         +----------+--------+-----+---------------+----------+--------+ PTA Distal7                           monophasic         +----------+--------+-----+---------------+----------+--------+ A focal velocity elevation of 308 cm/s was obtained at Distal SFA with a VR of 3.8. Findings are characteristic of 50-74% stenosis. A 2nd focal velocity elevation was visualized, measuring 348 cm/s at Proximal popliteal with a VR of 5.5. Findings are characteristic of 75-99% stenosis.   Summary: Right: 50-74% stenosis noted in the superficial femoral artery. 75-99% stenosis noted in the superficial femoral artery and/or popliteal artery.  See table(s) above for measurements and observations. Electronically signed by Levora Dredge MD on 12/04/2022 at 7:15:03 AM.    Final    VAS [place] ABI WITH/WO TBI Result Date: 12/04/2022  LOWER EXTREMITY DOPPLER STUDY Patient Name:  CHANNA STINNER  Date of Exam:   11/27/2022 Medical Rec #: [id number]      Accession #:    [id number] Date of Birth: Jan 02, 1961      Patient Gender: F Patient Age:   46 years Exam Location:  [facility] Procedure:      VAS [place] ABI WITH/WO TBI Referring Phys: [facility] --------------------------------------------------------------------------------  Indications: Claudication, and peripheral artery disease. High Risk Factors: Hyperlipidemia, Diabetes, current smoker.  Vascular Interventions: 02/20/2022: PTA Right SFA and Popliteal Artery. PTA and                         Stent placement CIA with an 8 x 37 Lifestream stent.  02/27/2013 Right fem-tib bypass graft. Performing Technologist: Hardie Lora RVT  Examination Guidelines: A complete evaluation includes at minimum, Doppler waveform signals and systolic blood pressure reading at the level of bilateral brachial, anterior tibial, and posterior tibial arteries, when vessel segments are accessible. Bilateral testing is considered an integral part of a complete examination. Photoelectric Plethysmograph (PPG) waveforms and toe systolic pressure readings are included as required and additional duplex testing as needed. Limited examinations for reoccurring indications may be performed as noted.  ABI Findings: +---------+------------------+-----+----------+--------+ Right    Rt Pressure (mmHg)IndexWaveform  Comment  +---------+------------------+-----+----------+--------+ Brachial 113                                       +---------+------------------+-----+----------+--------+ PTA      34                0.30 monophasic         +---------+------------------+-----+----------+--------+ DP       53                0.47 monophasic         +---------+------------------+-----+----------+--------+ Great Toe27                0.24                    +---------+------------------+-----+----------+--------+  +---------+------------------+-----+----------+-------+ Left     Lt Pressure (mmHg)IndexWaveform  Comment +---------+------------------+-----+----------+-------+ Brachial 110                                      +---------+------------------+-----+----------+-------+ PTA      113               1.00 triphasic         +---------+------------------+-----+----------+-------+ DP       105               0.93 monophasic        +---------+------------------+-----+----------+-------+ Great Toe68                0.60                   +---------+------------------+-----+----------+-------+ +-------+-----------+-----------+------------+------------+ ABI/TBIToday's ABIToday's TBIPrevious ABIPrevious TBI +-------+-----------+-----------+------------+------------+ Right  0.47       0.24       0.70        0.49         +-------+-----------+-----------+------------+------------+ Left   1.00       0.60       1.03        0.86         +-------+-----------+-----------+------------+------------+ Right ABIs appear decreased compared to prior study on 03/12/2022. Left ABIs appear essentially unchanged compared to prior study on 03/12/2022.  Summary: Right: Resting right ankle-brachial index indicates severe right lower extremity arterial disease. The right toe-brachial index is abnormal. Left: Resting left ankle-brachial index is within normal range. The left toe-brachial index is abnormal. *See table(s) above for measurements and observations.  Electronically signed by Levora Dredge MD on 12/04/2022 at 7:14:43 AM.    Final      Assessment/Plan: 1. Atherosclerosis of native artery of right lower extremity with rest pain (HCC) (Primary) Recommend:  The patient has evidence of severe atherosclerotic changes of both lower extremities with rest pain that is associated with preulcerative changes and impending tissue  loss of the right foot.  This represents a limb threatening ischemia and  places the patient at the risk for right limb loss.  Patient should undergo angiography of the right lower extremity with the hope for intervention for limb salvage.  The risks and benefits as well as the alternative therapies was discussed in detail with the patient.  All questions were answered.  Patient agrees to proceed with right lower extremity angiography.  The patient will follow up with me in the office after the procedure.   2. Type 2 diabetes mellitus with diabetic peripheral angiopathy without gangrene, without long-term current use of insulin (HCC) Continue hypoglycemic medications as already ordered, these medications have been reviewed and there are no changes at this time.  Hgb A1C to be monitored as already arranged by primary service  3. Chronic obstructive pulmonary disease, unspecified COPD type (HCC) Continue pulmonary medications and aerosols as already ordered, these medications have been reviewed and there are no changes at this time.   4. Mixed hyperlipidemia Continue statin as ordered and reviewed, no changes at this time  5. Gastroesophageal reflux disease without esophagitis Continue PPI as already ordered, this medication has been reviewed and there are no changes at this time.  Avoidence of caffeine and alcohol  Moderate elevation of the head of the bed     Levora Dredge, MD  12/20/2022 2:50 PM

## 2022-12-21 ENCOUNTER — Telehealth (INDEPENDENT_AMBULATORY_CARE_PROVIDER_SITE_OTHER): Payer: Self-pay

## 2022-12-21 ENCOUNTER — Encounter (INDEPENDENT_AMBULATORY_CARE_PROVIDER_SITE_OTHER): Payer: Self-pay | Admitting: Vascular Surgery

## 2022-12-21 MED ORDER — OXYCODONE HCL 5 MG PO TABS: 5.0000 mg | ORAL_TABLET | Freq: Two times a day (BID) | ORAL | 0 refills | Status: DC | PRN

## 2022-12-21 NOTE — Telephone Encounter (Signed)
Patient notified

## 2022-12-21 NOTE — Telephone Encounter (Signed)
Patient called stating Dr.Schnier hasn't sent in her Rx of Oxycodone yet.  Chart hasn't been signed off on yet.

## 2022-12-24 ENCOUNTER — Other Ambulatory Visit (INDEPENDENT_AMBULATORY_CARE_PROVIDER_SITE_OTHER): Payer: Self-pay | Admitting: Vascular Surgery

## 2022-12-24 ENCOUNTER — Telehealth (INDEPENDENT_AMBULATORY_CARE_PROVIDER_SITE_OTHER): Payer: Self-pay

## 2022-12-24 DIAGNOSIS — I70221 Atherosclerosis of native arteries of extremities with rest pain, right leg: Secondary | ICD-10-CM

## 2022-12-24 MED ORDER — HYDROCODONE-ACETAMINOPHEN 5-325 MG PO TABS: 1.0000 | ORAL_TABLET | Freq: Four times a day (QID) | ORAL | 0 refills | Status: DC | PRN

## 2022-12-24 NOTE — Progress Notes (Signed)
Patient's Insurance would not cover oxycodone

## 2022-12-24 NOTE — Telephone Encounter (Signed)
Tameka called stating the patients insurance needs a pre authorization for the Oxycodone 5 mg. The pharmacy said you may want to change it to something else.   Please advise

## 2022-12-26 ENCOUNTER — Other Ambulatory Visit (INDEPENDENT_AMBULATORY_CARE_PROVIDER_SITE_OTHER): Payer: Self-pay | Admitting: Nurse Practitioner

## 2022-12-26 NOTE — Telephone Encounter (Signed)
Her Rx for hydrocodone was approved with prior auth

## 2022-12-26 NOTE — Telephone Encounter (Signed)
Patient called stating that the Pharmacy hasn't filled the pain medication for her. She still needs a pre authorization for either medications that was sent in.   Please advise

## 2022-12-27 ENCOUNTER — Other Ambulatory Visit: Payer: Self-pay | Admitting: Cardiovascular Disease

## 2022-12-28 ENCOUNTER — Other Ambulatory Visit: Payer: Self-pay | Admitting: General Practice

## 2022-12-28 MED ORDER — BD PEN NEEDLE MINI U/F 31G X 5 MM MISC: 0 refills | Status: DC

## 2022-12-28 NOTE — Telephone Encounter (Signed)
Patient picked up medication but was only able to get 20 qty. They wouldn't give her the 30 qty.

## 2023-01-11 ENCOUNTER — Telehealth (INDEPENDENT_AMBULATORY_CARE_PROVIDER_SITE_OTHER): Payer: Self-pay

## 2023-01-11 NOTE — Telephone Encounter (Signed)
 Spoke with the patient and she is scheduled with Dr. Jama on 01/15/23 with a 6:45 am arrival time to the [facility]. Pre-procedure instructions were discussed and will be mailed as the patient stated she doesn't know how to use Mychart. Patient stated she wrote this information down.

## 2023-01-15 DIAGNOSIS — I70221 Atherosclerosis of native arteries of extremities with rest pain, right leg: Secondary | ICD-10-CM

## 2023-01-16 NOTE — Progress Notes (Deleted)
 MRN : [id number]  Shannon Russell is a 63 y.o. (07/22/1960) female who presents with chief complaint of check circulation.  History of Present Illness:   The patient returns to the office for followup and review status post angiogram with intervention on 02/20/2022.    Procedure:  Percutaneous transluminal angioplasty right superficial femoral and popliteal artery. 2.    Percutaneous transluminal angioplasty and stent placement left common iliac artery with an 8 x 37 Lifestream stent.     The patient notes that there has been a significant deterioration in the lower extremity symptoms.  The patient notes interval shortening of their claudication distance and development of mild rest pain symptoms. No new ulcers or wounds have occurred since the last visit.   There have been no significant changes to the patient's overall health care.   The patient denies amaurosis fugax or recent TIA symptoms. There are no recent neurological changes noted. There is no history of DVT, PE or superficial thrombophlebitis. The patient denies recent episodes of angina or shortness of breath.    ABI's Rt=0.47 and Lt=1.00 (previous ABI's Rt=0.70 and Lt=1.03) Duplex US  of the right lower extremity arterial system shows tandem >75% stenosis.  No outpatient medications have been marked as taking for the 01/17/23 encounter (Appointment) with Jama, Cordella MATSU, MD.    Past Medical History:  Diagnosis Date   Arthritis    Asthma    Cancer [facility])    COPD (chronic obstructive pulmonary disease) (HCC)    Depression    Diabetes mellitus without complication (HCC)    H/O blood clots    PAD (peripheral artery disease) (HCC)    Thyroid disease     Past Surgical History:  Procedure Laterality Date   Bypass right leg  2015   CHOLECYSTECTOMY  1981   LOWER EXTREMITY ANGIOGRAPHY Right 02/20/2022   Procedure: Lower Extremity Angiography;  Surgeon:  Jama Cordella MATSU, MD;  Location: [facility] INVASIVE CV LAB;  Service: Cardiovascular;  Laterality: Right;   Mesh implant right leg  2015   THYROIDECTOMY  2013   TUBAL LIGATION  1993    Social History Social History   Tobacco Use   Smoking status: Every Day    Current packs/day: 0.50    Average packs/day: 0.5 packs/day for 45.0 years (22.5 ttl pk-yrs)    Types: Cigarettes   Smokeless tobacco: Never   Tobacco comments:    6 ciggs a day.  Vaping Use   Vaping status: Never Used  Substance Use Topics   Alcohol  use: Not Currently   Drug use: Not Currently    Family History Family History  Problem Relation Age of Onset   Heart failure Mother    Depression Mother    Peripheral Artery Disease Sister    Cancer Sister    Clotting disorder Sister    Depression Sister    Thyroid disease Sister     Allergies  Allergen Reactions   Iobenguane I 131 Hives   Advair Diskus [Fluticasone -Salmeterol] Hives   Iodine  Hives and Rash    Other reaction(s): Skin Rashes, Hives  Pt states she need  premedication for contrast exposure     REVIEW OF SYSTEMS (Negative unless checked)  Constitutional: [] Weight loss  [] Fever  [] Chills Cardiac: [] Chest pain   [] Chest pressure   [] Palpitations   [] Shortness of breath when laying flat   [] Shortness of breath with exertion. Vascular:  [x] Pain in legs with walking   [] Pain in legs at rest  [] History of DVT   [] Phlebitis   [] Swelling in legs   [] Varicose veins   [] Non-healing ulcers Pulmonary:   [] Uses home oxygen   [] Productive cough   [] Hemoptysis   [] Wheeze  [x] COPD   [] Asthma Neurologic:  [] Dizziness   [] Seizures   [] History of stroke   [] History of TIA  [] Aphasia   [] Vissual changes   [] Weakness or numbness in arm   [x] Weakness or numbness in leg Musculoskeletal:   [] Joint swelling   [] Joint pain   [] Low back pain Hematologic:  [] Easy bruising  [] Easy bleeding   [] Hypercoagulable state   [] Anemic Gastrointestinal:  [] Diarrhea   [] Vomiting   [x] Gastroesophageal reflux/heartburn   [] Difficulty swallowing. Genitourinary:  [x] Chronic kidney disease   [] Difficult urination  [] Frequent urination   [] Blood in urine Skin:  [] Rashes   [] Ulcers  Psychological:  [] History of anxiety   []  History of major depression.  Physical Examination  There were no vitals filed for this visit. There is no height or weight on file to calculate BMI. Gen: WD/WN, NAD Head: [place]/AT, No temporalis wasting.  Ear/Nose/Throat: Hearing grossly intact, nares w/o erythema or drainage Eyes: PER, EOMI, sclera nonicteric.  Neck: Supple, no masses.  No bruit or JVD.  Pulmonary:  Good air movement, no audible wheezing, no use of accessory muscles.  Cardiac: RRR, normal S1, S2, no Murmurs. Vascular:  mild trophic changes, no open wounds Vessel Right Left  Radial Palpable Palpable  PT Not Palpable Not Palpable  DP Not Palpable Not Palpable  Gastrointestinal: soft, non-distended. No guarding/no peritoneal signs.  Musculoskeletal: M/S 5/5 throughout.  No visible deformity.  Neurologic: CN 2-12 intact. Pain and light touch intact in extremities.  Symmetrical.  Speech is fluent. Motor exam as listed above. Psychiatric: Judgment intact, Mood & affect appropriate for pt's clinical situation. Dermatologic: No rashes or ulcers noted.  No changes consistent with cellulitis.   CBC Lab Results  Component Value Date   WBC 10.5 09/06/2022   HGB 12.9 09/06/2022   HCT 39.0 09/06/2022   MCV 89 09/06/2022   PLT 281 09/06/2022    BMET    Component Value Date/Time   NA 137 09/06/2022 1324   K 5.0 09/06/2022 1324   CL 103 09/06/2022 1324   CO2 21 09/06/2022 1324   GLUCOSE 139 (H) 09/06/2022 1324   GLUCOSE 335 (H) 07/29/2021 0409   BUN 32 (H) 09/06/2022 1324   CREATININE 1.33 (H) 09/06/2022 1324   CALCIUM  9.4 09/06/2022 1324   GFRNONAA 44 (L) 02/20/2022 0744   GFRAA >60 01/20/2019 0529   CrCl cannot be calculated (Patient's most recent lab result is older than the  maximum 21 days allowed.).  COAG No results found for: INR, PROTIME  Radiology No results found.   Assessment/Plan There are no diagnoses linked to this encounter.   Cordella Shawl, MD  01/16/2023 7:45 AM

## 2023-01-17 ENCOUNTER — Ambulatory Visit (INDEPENDENT_AMBULATORY_CARE_PROVIDER_SITE_OTHER): Payer: Medicaid Other | Admitting: Vascular Surgery

## 2023-01-17 DIAGNOSIS — I890 Lymphedema, not elsewhere classified: Secondary | ICD-10-CM

## 2023-01-17 DIAGNOSIS — I70221 Atherosclerosis of native arteries of extremities with rest pain, right leg: Secondary | ICD-10-CM

## 2023-01-17 DIAGNOSIS — J449 Chronic obstructive pulmonary disease, unspecified: Secondary | ICD-10-CM

## 2023-01-17 DIAGNOSIS — E782 Mixed hyperlipidemia: Secondary | ICD-10-CM

## 2023-01-17 DIAGNOSIS — E1151 Type 2 diabetes mellitus with diabetic peripheral angiopathy without gangrene: Secondary | ICD-10-CM

## 2023-01-22 ENCOUNTER — Telehealth (INDEPENDENT_AMBULATORY_CARE_PROVIDER_SITE_OTHER): Payer: Self-pay

## 2023-01-22 ENCOUNTER — Telehealth: Payer: Self-pay | Admitting: Cardiology

## 2023-01-22 DIAGNOSIS — I70221 Atherosclerosis of native arteries of extremities with rest pain, right leg: Secondary | ICD-10-CM

## 2023-01-22 NOTE — Telephone Encounter (Signed)
 Patient called stating she missed her procedure with Dr. Jama today because the person that was to bring her had Covid. Patient has been rescheduled to 02/05/23 with a 8:00 am arrival time to the [facility] per request. Rescheduled pre-procedure instructions will be sent to Mychart and mailed.

## 2023-01-22 NOTE — Telephone Encounter (Signed)
 Mindy with [employer] called stating that they needed Oxygen office visits within the last 6 months. Please fax [phone], phone#[phone] ext. [phone].

## 2023-01-22 NOTE — Telephone Encounter (Signed)
 Faxed

## 2023-01-30 ENCOUNTER — Other Ambulatory Visit: Payer: Self-pay | Admitting: Cardiovascular Disease

## 2023-02-05 ENCOUNTER — Encounter
Admission: RE | Disposition: A | Discharge status: Home / Self Care | Payer: Self-pay | Source: Home / Self Care | Attending: Vascular Surgery

## 2023-02-05 ENCOUNTER — Other Ambulatory Visit: Payer: Self-pay

## 2023-02-05 ENCOUNTER — Encounter: Payer: Self-pay | Admitting: Vascular Surgery

## 2023-02-05 ENCOUNTER — Ambulatory Visit
Admission: RE | Admit: 2023-02-05 | Discharge: 2023-02-05 | Disposition: A | Discharge status: Home / Self Care | Payer: Medicaid Other | Attending: Vascular Surgery | Admitting: Vascular Surgery

## 2023-02-05 DIAGNOSIS — E1151 Type 2 diabetes mellitus with diabetic peripheral angiopathy without gangrene: Secondary | ICD-10-CM | POA: Diagnosis present

## 2023-02-05 DIAGNOSIS — J449 Chronic obstructive pulmonary disease, unspecified: Secondary | ICD-10-CM | POA: Insufficient documentation

## 2023-02-05 DIAGNOSIS — L97919 Non-pressure chronic ulcer of unspecified part of right lower leg with unspecified severity: Secondary | ICD-10-CM | POA: Diagnosis not present

## 2023-02-05 DIAGNOSIS — E782 Mixed hyperlipidemia: Secondary | ICD-10-CM | POA: Insufficient documentation

## 2023-02-05 DIAGNOSIS — I70221 Atherosclerosis of native arteries of extremities with rest pain, right leg: Secondary | ICD-10-CM | POA: Diagnosis not present

## 2023-02-05 DIAGNOSIS — K219 Gastro-esophageal reflux disease without esophagitis: Secondary | ICD-10-CM | POA: Diagnosis not present

## 2023-02-05 DIAGNOSIS — I700 Atherosclerosis of aorta: Secondary | ICD-10-CM

## 2023-02-05 DIAGNOSIS — I70239 Atherosclerosis of native arteries of right leg with ulceration of unspecified site: Secondary | ICD-10-CM

## 2023-02-05 DIAGNOSIS — Z79899 Other long term (current) drug therapy: Secondary | ICD-10-CM | POA: Diagnosis not present

## 2023-02-05 DIAGNOSIS — F17210 Nicotine dependence, cigarettes, uncomplicated: Secondary | ICD-10-CM | POA: Diagnosis not present

## 2023-02-05 HISTORY — DX: Emphysema, unspecified: J43.9

## 2023-02-05 HISTORY — DX: Peripheral vascular disease, unspecified: I73.9

## 2023-02-05 HISTORY — PX: LOWER EXTREMITY ANGIOGRAPHY: CATH118251

## 2023-02-05 LAB — BUN: BUN: 33 mg/dL — ABNORMAL HIGH (ref 8–23)

## 2023-02-05 LAB — CREATININE, SERUM
Creatinine, Ser: 1.71 mg/dL — ABNORMAL HIGH (ref 0.44–1.00)
GFR, Estimated: 34 mL/min — ABNORMAL LOW (ref >60)

## 2023-02-05 LAB — GLUCOSE, CAPILLARY: Glucose-Capillary: 203 mg/dL — ABNORMAL HIGH (ref 70–99)

## 2023-02-05 SURGERY — LOWER EXTREMITY ANGIOGRAPHY
Anesthesia: Moderate Sedation | Site: Leg Lower | Laterality: Right

## 2023-02-05 MED ORDER — DIPHENHYDRAMINE HCL 50 MG/ML IJ SOLN
INTRAMUSCULAR | Status: AC
  Administered 2023-02-05: 50 mg via INTRAVENOUS
  Filled 2023-02-05: qty 1

## 2023-02-05 MED ORDER — CEFAZOLIN SODIUM-DEXTROSE 2-4 GM/100ML-% IV SOLN
2.0000 g | INTRAVENOUS | Status: AC
  Administered 2023-02-05: 2 g via INTRAVENOUS

## 2023-02-05 MED ORDER — OXYCODONE HCL 5 MG PO TABS: 5.0000 mg | ORAL_TABLET | ORAL | Status: DC | PRN

## 2023-02-05 MED ORDER — MIDAZOLAM HCL 2 MG/2ML IJ SOLN
INTRAMUSCULAR | Status: DC | PRN
  Administered 2023-02-05: 2 mg via INTRAVENOUS

## 2023-02-05 MED ORDER — ACETAMINOPHEN 325 MG PO TABS
650.0000 mg | ORAL_TABLET | ORAL | Status: DC | PRN
Start: 2023-02-05 — End: 2023-02-05

## 2023-02-05 MED ORDER — IODIXANOL 320 MG/ML IV SOLN
INTRAVENOUS | Status: DC | PRN
  Administered 2023-02-05: 75 mL via INTRA_ARTERIAL

## 2023-02-05 MED ORDER — LIDOCAINE HCL (PF) 1 % IJ SOLN
INTRAMUSCULAR | Status: DC | PRN
  Administered 2023-02-05: 10 mL via INTRADERMAL

## 2023-02-05 MED ORDER — FENTANYL CITRATE (PF) 100 MCG/2ML IJ SOLN
INTRAMUSCULAR | Status: DC | PRN
  Administered 2023-02-05 (×2): 50 ug via INTRAVENOUS

## 2023-02-05 MED ORDER — HEPARIN SODIUM (PORCINE) 1000 UNIT/ML IJ SOLN
INTRAMUSCULAR | Status: AC
  Filled 2023-02-05: qty 10

## 2023-02-05 MED ORDER — HYDROMORPHONE HCL 1 MG/ML IJ SOLN: 1.0000 mg | Freq: Once | INTRAMUSCULAR | Status: DC | PRN

## 2023-02-05 MED ORDER — HEPARIN (PORCINE) IN NACL 1000-0.9 UT/500ML-% IV SOLN
INTRAVENOUS | Status: DC | PRN
  Administered 2023-02-05: 1000 mL

## 2023-02-05 MED ORDER — FAMOTIDINE 20 MG PO TABS
ORAL_TABLET | ORAL | Status: AC
  Administered 2023-02-05: 40 mg via ORAL
  Filled 2023-02-05: qty 2

## 2023-02-05 MED ORDER — DIPHENHYDRAMINE HCL 50 MG/ML IJ SOLN: 50.0000 mg | Freq: Once | INTRAMUSCULAR | Status: AC | PRN

## 2023-02-05 MED ORDER — HEPARIN SODIUM (PORCINE) 1000 UNIT/ML IJ SOLN
INTRAMUSCULAR | Status: DC | PRN
  Administered 2023-02-05: 5000 [IU] via INTRAVENOUS

## 2023-02-05 MED ORDER — ONDANSETRON HCL 4 MG/2ML IJ SOLN: 4.0000 mg | Freq: Four times a day (QID) | INTRAMUSCULAR | Status: DC | PRN

## 2023-02-05 MED ORDER — FENTANYL CITRATE (PF) 100 MCG/2ML IJ SOLN
INTRAMUSCULAR | Status: AC
  Filled 2023-02-05: qty 2

## 2023-02-05 MED ORDER — MORPHINE SULFATE (PF) 4 MG/ML IV SOLN: 2.0000 mg | INTRAVENOUS | Status: DC | PRN

## 2023-02-05 MED ORDER — MIDAZOLAM HCL 2 MG/ML PO SYRP: 8.0000 mg | ORAL_SOLUTION | Freq: Once | ORAL | Status: DC | PRN

## 2023-02-05 MED ORDER — FAMOTIDINE 20 MG PO TABS: 40.0000 mg | ORAL_TABLET | Freq: Once | ORAL | Status: AC | PRN

## 2023-02-05 MED ORDER — SODIUM CHLORIDE 0.9% FLUSH: 3.0000 mL | Freq: Two times a day (BID) | INTRAVENOUS | Status: DC

## 2023-02-05 MED ORDER — LABETALOL HCL 5 MG/ML IV SOLN: 10.0000 mg | INTRAVENOUS | Status: DC | PRN

## 2023-02-05 MED ORDER — METHYLPREDNISOLONE SODIUM SUCC 125 MG IJ SOLR: 125.0000 mg | Freq: Once | INTRAMUSCULAR | Status: AC | PRN

## 2023-02-05 MED ORDER — SODIUM CHLORIDE 0.9 % IV SOLN: INTRAVENOUS | Status: DC

## 2023-02-05 MED ORDER — METHYLPREDNISOLONE SODIUM SUCC 125 MG IJ SOLR
INTRAMUSCULAR | Status: AC
  Administered 2023-02-05: 125 mg via INTRAVENOUS
  Filled 2023-02-05: qty 2

## 2023-02-05 MED ORDER — SODIUM CHLORIDE 0.9 % IV SOLN
INTRAVENOUS | Status: DC
Start: 2023-02-05 — End: 2023-02-05

## 2023-02-05 MED ORDER — MIDAZOLAM HCL 2 MG/2ML IJ SOLN
INTRAMUSCULAR | Status: AC
  Filled 2023-02-05: qty 2

## 2023-02-05 MED ORDER — SODIUM CHLORIDE 0.9 % IV SOLN: 250.0000 mL | INTRAVENOUS | Status: DC | PRN

## 2023-02-05 MED ORDER — HYDRALAZINE HCL 20 MG/ML IJ SOLN: 5.0000 mg | INTRAMUSCULAR | Status: DC | PRN

## 2023-02-05 MED ORDER — SODIUM CHLORIDE 0.9% FLUSH: 3.0000 mL | INTRAVENOUS | Status: DC | PRN

## 2023-02-05 SURGICAL SUPPLY — 31 items
BALLN LUTONIX 018 4X220X130 (BALLOONS) ×1
BALLN ULTRASCORE 014 2X200X150 (BALLOONS) ×1
BALLN ULTRASCORE 014 3X200X150 (BALLOONS) ×1
BALLOON LUTONIX 018 4X220X130 (BALLOONS) IMPLANT
BALLOON ULTRSCRE 014 2X200X150 (BALLOONS) IMPLANT
BALLOON ULTRSCRE 014 3X200X150 (BALLOONS) IMPLANT
CATH ANGIO 5F PIGTAIL 65CM (CATHETERS) IMPLANT
CATH BEACON 5 .038 100 VERT TP (CATHETERS) IMPLANT
CATH MICROCATH PRGRT 2.8F 130 (MICROCATHETER) IMPLANT
CATH SEEKER .035X135CM (CATHETERS) IMPLANT
COVER PROBE ULTRASOUND 5X96 (MISCELLANEOUS) IMPLANT
DEVICE PRESTO INFLATION (MISCELLANEOUS) IMPLANT
DEVICE STARCLOSE SE CLOSURE (Vascular Products) IMPLANT
DEVICE TORQUE (MISCELLANEOUS) IMPLANT
GLIDEWIRE ADV .035X260CM (WIRE) IMPLANT
GLIDEWIRE ANGLED SS 035X260CM (WIRE) IMPLANT
GOWN STRL REUS W/ TWL LRG LVL3 (GOWN DISPOSABLE) ×1 IMPLANT
MICROCATH PROGREAT 2.8F 130CM (MICROCATHETER) ×1
NDL ENTRY 21GA 7CM ECHOTIP (NEEDLE) IMPLANT
NEEDLE ENTRY 21GA 7CM ECHOTIP (NEEDLE) ×1
PACK ANGIOGRAPHY (CUSTOM PROCEDURE TRAY) ×1 IMPLANT
SET INTRO CAPELLA COAXIAL (SET/KITS/TRAYS/PACK) IMPLANT
SHEATH BRITE TIP 5FRX11 (SHEATH) IMPLANT
SHEATH RAABE 6FR (SHEATH) IMPLANT
STENT LIFESTENT 5F 5X170X135 (Permanent Stent) IMPLANT
STENT LIFESTENT 5F 5X60X135 (Permanent Stent) IMPLANT
SYR MEDRAD MARK 7 150ML (SYRINGE) IMPLANT
TUBING CONTRAST HIGH PRESS 72 (TUBING) IMPLANT
WIRE GUIDERIGHT .035X150 (WIRE) IMPLANT
WIRE NITINOL .018 (WIRE) IMPLANT
WIRE RUNTHROUGH .014X300CM (WIRE) IMPLANT

## 2023-02-05 NOTE — Progress Notes (Signed)
MRN : [id number]  Shannon Russell is a 63 y.o. (16-Nov-1960) female who presents with chief complaint of check circulation.  History of Present Illness:  Patient presents to [facility] today for treatment of her right lower extremity atherosclerotic occlusive disease.  The patient is post angiogram with intervention on 02/20/2022.    Procedure:  Percutaneous transluminal angioplasty right superficial femoral and popliteal artery. 2.    Percutaneous transluminal angioplasty and stent placement left common iliac artery with an 8 x 37 Lifestream stent.     The patient notes that there has been a significant deterioration in the lower extremity symptoms.  The patient notes interval shortening of their claudication distance and development of mild rest pain symptoms. No new ulcers or wounds have occurred since the last visit.   There have been no significant changes to the patient's overall health care.   The patient denies amaurosis fugax or recent TIA symptoms. There are no recent neurological changes noted. There is no history of DVT, PE or superficial thrombophlebitis. The patient denies recent episodes of angina or shortness of breath.    ABI's Rt=0.47 and Lt=1.00 (previous ABI's Rt=0.70 and Lt=1.03) Duplex US of the right lower extremity arterial system shows tandem >75% stenosis.   No outpatient medications have been marked as taking for the 02/05/23 encounter [facility] Encounter).    Past Medical History:  Diagnosis Date   Arthritis    Asthma    Cancer (HCC)    COPD (chronic obstructive pulmonary disease) (HCC)    Depression    Diabetes mellitus without complication (HCC)    H/O blood clots    PAD (peripheral artery disease) (HCC)    Thyroid disease     Past Surgical History:  Procedure Laterality Date   Bypass right leg  2015   CHOLECYSTECTOMY  1981   LOWER EXTREMITY ANGIOGRAPHY  Right 02/20/2022   Procedure: Lower Extremity Angiography;  Surgeon: Renford Dills, MD;  Location: [facility] INVASIVE CV LAB;  Service: Cardiovascular;  Laterality: Right;   Mesh implant right leg  2015   THYROIDECTOMY  2013   TUBAL LIGATION  1993    Social History Social History   Tobacco Use   Smoking status: Every Day    Current packs/day: 0.50    Average packs/day: 0.5 packs/day for 45.0 years (22.5 ttl pk-yrs)    Types: Cigarettes   Smokeless tobacco: Never   Tobacco comments:    6 ciggs a day.  Vaping Use   Vaping status: Never Used  Substance Use Topics   Alcohol use: Not Currently   Drug use: Not Currently    Family History Family History  Problem Relation Age of Onset   Heart failure Mother    Depression Mother    Peripheral Artery Disease Sister    Cancer Sister    Clotting disorder Sister    Depression Sister    Thyroid disease Sister     Allergies  Allergen Reactions   Iobenguane I 131 Hives   Advair Diskus [Fluticasone-Salmeterol] Hives   Iodinated Contrast Media Hives   Iodine Hives and Rash  Other reaction(s): Skin Rashes, Hives  Pt states she need premedication for contrast exposure     REVIEW OF SYSTEMS (Negative unless checked)  Constitutional: [] Weight loss  [] Fever  [] Chills Cardiac: [] Chest pain   [] Chest pressure   [] Palpitations   [] Shortness of breath when laying flat   [] Shortness of breath with exertion. Vascular:  [x] Pain in legs with walking   [] Pain in legs at rest  [] History of DVT   [] Phlebitis   [] Swelling in legs   [] Varicose veins   [] Non-healing ulcers Pulmonary:   [] Uses home oxygen   [] Productive cough   [] Hemoptysis   [] Wheeze  [] COPD   [] Asthma Neurologic:  [] Dizziness   [] Seizures   [] History of stroke   [] History of TIA  [] Aphasia   [] Vissual changes   [] Weakness or numbness in arm   [] Weakness or numbness in leg Musculoskeletal:   [] Joint swelling   [] Joint pain   [] Low back pain Hematologic:  [] Easy bruising  [] Easy  bleeding   [] Hypercoagulable state   [] Anemic Gastrointestinal:  [] Diarrhea   [] Vomiting  [] Gastroesophageal reflux/heartburn   [] Difficulty swallowing. Genitourinary:  [] Chronic kidney disease   [] Difficult urination  [] Frequent urination   [] Blood in urine Skin:  [] Rashes   [] Ulcers  Psychological:  [] History of anxiety   []  History of major depression.  Physical Examination  There were no vitals filed for this visit. There is no height or weight on file to calculate BMI. Gen: WD/WN, NAD Head: [place]/AT, No temporalis wasting.  Ear/Nose/Throat: Hearing grossly intact, nares w/o erythema or drainage Eyes: PER, EOMI, sclera nonicteric.  Neck: Supple, no masses.  No bruit or JVD.  Pulmonary:  Good air movement, no audible wheezing, no use of accessory muscles.  Cardiac: RRR, normal S1, S2, no Murmurs. Vascular:  mild trophic changes, no open wounds Vessel Right Left  Radial Palpable Palpable  PT Not Palpable Not Palpable  DP Not Palpable Not Palpable  Gastrointestinal: soft, non-distended. No guarding/no peritoneal signs.  Musculoskeletal: M/S 5/5 throughout.  No visible deformity.  Neurologic: CN 2-12 intact. Pain and light touch intact in extremities.  Symmetrical.  Speech is fluent. Motor exam as listed above. Psychiatric: Judgment intact, Mood & affect appropriate for pt's clinical situation. Dermatologic: No rashes or ulcers noted.  No changes consistent with cellulitis.   CBC Lab Results  Component Value Date   WBC 10.5 09/06/2022   HGB 12.9 09/06/2022   HCT 39.0 09/06/2022   MCV 89 09/06/2022   PLT 281 09/06/2022    BMET    Component Value Date/Time   NA 137 09/06/2022 1324   K 5.0 09/06/2022 1324   CL 103 09/06/2022 1324   CO2 21 09/06/2022 1324   GLUCOSE 139 (H) 09/06/2022 1324   GLUCOSE 335 (H) 07/29/2021 0409   BUN 32 (H) 09/06/2022 1324   CREATININE 1.33 (H) 09/06/2022 1324   CALCIUM 9.4 09/06/2022 1324   GFRNONAA 44 (L) 02/20/2022 0744   GFRAA >60  01/20/2019 0529   CrCl cannot be calculated (Patient's most recent lab result is older than the maximum 21 days allowed.).  COAG No results found for: "INR", "PROTIME"  Radiology No results found.   Assessment/Plan 1. Atherosclerosis of native artery of right lower extremity with rest pain (HCC) (Primary) Recommend:   The patient has evidence of severe atherosclerotic changes of both lower extremities with rest pain that is associated with preulcerative changes and impending tissue loss of the right foot.  This represents a limb threatening ischemia and places  the patient at the risk for right limb loss.   Patient should undergo angiography of the right lower extremity with the hope for intervention for limb salvage.  The risks and benefits as well as the alternative therapies was discussed in detail with the patient.  All questions were answered.  Patient agrees to proceed with right lower extremity angiography.   The patient will follow up with me in the office after the procedure.    2. Type 2 diabetes mellitus with diabetic peripheral angiopathy without gangrene, without long-term current use of insulin (HCC) Continue hypoglycemic medications as already ordered, these medications have been reviewed and there are no changes at this time.   Hgb A1C to be monitored as already arranged by primary service   3. Chronic obstructive pulmonary disease, unspecified COPD type (HCC) Continue pulmonary medications and aerosols as already ordered, these medications have been reviewed and there are no changes at this time.     4. Mixed hyperlipidemia Continue statin as ordered and reviewed, no changes at this time   5. Gastroesophageal reflux disease without esophagitis Continue PPI as already ordered, this medication has been reviewed and there are no changes at this time.   Avoidence of caffeine and alcohol   Moderate elevation of the head of the bed    Levora Dredge,  MD  02/05/2023 8:30 AM

## 2023-02-05 NOTE — Op Note (Signed)
[facility]  Percutaneous Study/Intervention Procedural Note   Date of Surgery: 02/05/2023  Surgeon:  Levora Dredge  Pre-operative Diagnosis: Atherosclerotic occlusive disease bilateral lower extremities with right lower extremity with rest pain and ulceration.  Post-operative diagnosis:  Same  Procedure(s) Performed:             1.  Introduction catheter into right lower extremity 3rd order catheter placement               2.    Contrast injection right lower extremity for distal runoff             3.  Percutaneous transluminal angioplasty and stent placement right superficial femoral and popliteal arteries to 4 mm             4.  Percutaneous transluminal angioplasty right anterior tibial artery to 3 mm             5.  Star close closure left common femoral arteriotomy  Anesthesia: Conscious sedation was administered under my direct supervision by the interventional radiology RN. IV Versed plus fentanyl were utilized. Continuous ECG, pulse oximetry and blood pressure was monitored throughout the entire procedure.  Conscious sedation was for a total of 83.  Sheath: 6 [place] Rabie left common femoral retrograde  Contrast: 70 cc  Fluoroscopy Time: 14 minutes  Indications:  Shannon Russell presents with increasing pain of the right lower extremity.  Noninvasive studies as well as physical examination show significant atherosclerotic occlusive disease.  This suggests the patient is having limb threatening ischemia.  Angiography with hope for intervention is recommended.  The risks and benefits are reviewed all questions answered patient agrees to proceed.  Procedure:   Shannon Russell is a 63 y.o. y.o. female who was identified and appropriate procedural time out was performed.  The patient was then placed supine on the table and prepped and draped in the usual sterile fashion.    Ultrasound was placed in the sterile sleeve and the left groin was evaluated the left  common femoral artery was echolucent and pulsatile indicating patency.  Image was recorded for the permanent record and under real-time visualization a microneedle was inserted into the common femoral artery followed by the microwire and then the micro-sheath.  A J-wire was then advanced through the micro-sheath and a  5 [place] sheath was then inserted over a J-wire. J-wire was then advanced and a 5 French pigtail catheter was positioned at the level of T12.  AP projection of the aorta was then obtained. Pigtail catheter was repositioned to above the bifurcation and a Simonne Come view of the pelvis was obtained.  Subsequently a pigtail catheter with an Advantage wire was used to cross the aortic bifurcation.  The catheter and wire were advanced down into the right distal external iliac artery. Oblique view of the femoral bifurcation was then obtained and subsequently the wire was reintroduced and the pigtail catheter negotiated into the SFA representing third order catheter placement. Distal runoff was then performed.  5000 units of heparin was then given and allowed to circulate for several minutes.  A 6 French Rabie sheath was advanced up and over the bifurcation and positioned in the femoral artery  KMP catheter and advantage Glidewire were then negotiated down into the distal popliteal. Catheter was then advanced. Hand injection contrast demonstrated the tibial anatomy in further detail.  A 0.014 run-through wire was then advanced through the Kumpe catheter.  A 2 mm x 100 mm ultra score  balloon was used to angioplasty the anterior tibial.  The inflation was for 1 minute at 8 atm.  Next, a 3 mm x 100 mm ultra score balloon is positioned in the proximal anterior tibial inflations to 10 atm for 1 minute.  The balloon is deflated then repositioned across the above-knee popliteal at Hunter's canal and again inflated to 10 atm for 1 minute.  Follow-up imaging now demonstrates the anterior tibial artery and the distal  popliteal artery is widely patent with less than 10% residual stenosis.  However beginning in the distal SFA and extending into the above-knee popliteal to the level of the tibial plateau there is greater than 60% residual stenosis.  A 5 mm x 170 mm life stent is then deployed with its distal and at the level of the tibial plateau.  A second 5 mm x 60 mm life stent is then deployed proximally overlapping the initial stent by 1 cm.  A 4 mm x 220 mm Lutonix drug-eluting balloon is then used to post dilate the SFA popliteal stents.  Inflation is to 10 atm for 1 full minute.  Follow-up imaging is then obtained demonstrates less than 10% residual stenosis throughout the SFA popliteal and the anterior tibial with preservation of distal runoff.  After review of these images the sheath is pulled into the left external iliac oblique of the common femoral is obtained and a Star close device deployed. There no immediate Complications.  Findings:  The abdominal aorta is opacified with a bolus injection contrast. Renal arteries are single and widely patent without evidence of hemodynamically significant stenosis.  The aorta itself has diffuse disease but no hemodynamically significant lesions. The common and external iliac arteries are widely patent bilaterally.  The right common femoral is widely patent as is the profunda femoris.  The SFA does indeed have a significant stenosis beginning in its midportion extending through Hunter's canal.  The proximal to mid popliteal is diseased with greater than 80% stenosis.  At the level of the tibial plateau the distal popliteal demonstrates an occlusion that extends into the trifurcation.  The anterior tibial is reconstituted approximately 1 cm distal to its origin and is widely patent down to the foot with the exception of a greater than 60% stenosis approximately 4 mm distal (in the proximal one third of the anterior tibial).    Following angioplasty anterior tibial now is  in-line flow and looks quite nice. Angioplasty and stent placement of the SFA at Hunter's canal and the above-knee popliteal yields an excellent result with less than 10% residual stenosis.    Follow-up imaging is then obtained after the angioplasty and stent placement and demonstrates less than 10% residual stenosis throughout the SFA popliteal and the anterior tibial with preservation of distal runoff.  Summary: Successful recanalization right lower extremity for limb salvage                           Disposition: Patient was taken to the recovery room in stable condition having tolerated the procedure well.  Shannon Russell 02/05/2023,11:08 AM

## 2023-02-05 NOTE — Interval H&P Note (Signed)
History and Physical Interval Note:  02/05/2023 8:33 AM  Shannon Russell  has presented today for surgery, with the diagnosis of RLE Angio w SFA Stent Placement    Critical limb ischemia.  The various methods of treatment have been discussed with the patient and family. After consideration of risks, benefits and other options for treatment, the patient has consented to  Procedure(s): Lower Extremity Angiography (Right) as a surgical intervention.  The patient's history has been reviewed, patient examined, no change in status, stable for surgery.  I have reviewed the patient's chart and labs.  Questions were answered to the patient's satisfaction.     Levora Dredge

## 2023-02-05 NOTE — H&P (View-Only) (Signed)
MRN : [id number]  Shannon Russell is a 63 y.o. (16-Nov-1960) female who presents with chief complaint of check circulation.  History of Present Illness:  Patient presents to [facility] today for treatment of her right lower extremity atherosclerotic occlusive disease.  The patient is post angiogram with intervention on 02/20/2022.    Procedure:  Percutaneous transluminal angioplasty right superficial femoral and popliteal artery. 2.    Percutaneous transluminal angioplasty and stent placement left common iliac artery with an 8 x 37 Lifestream stent.     The patient notes that there has been a significant deterioration in the lower extremity symptoms.  The patient notes interval shortening of their claudication distance and development of mild rest pain symptoms. No new ulcers or wounds have occurred since the last visit.   There have been no significant changes to the patient's overall health care.   The patient denies amaurosis fugax or recent TIA symptoms. There are no recent neurological changes noted. There is no history of DVT, PE or superficial thrombophlebitis. The patient denies recent episodes of angina or shortness of breath.    ABI's Rt=0.47 and Lt=1.00 (previous ABI's Rt=0.70 and Lt=1.03) Duplex US of the right lower extremity arterial system shows tandem >75% stenosis.   No outpatient medications have been marked as taking for the 02/05/23 encounter [facility] Encounter).    Past Medical History:  Diagnosis Date   Arthritis    Asthma    Cancer (HCC)    COPD (chronic obstructive pulmonary disease) (HCC)    Depression    Diabetes mellitus without complication (HCC)    H/O blood clots    PAD (peripheral artery disease) (HCC)    Thyroid disease     Past Surgical History:  Procedure Laterality Date   Bypass right leg  2015   CHOLECYSTECTOMY  1981   LOWER EXTREMITY ANGIOGRAPHY  Right 02/20/2022   Procedure: Lower Extremity Angiography;  Surgeon: Renford Dills, MD;  Location: [facility] INVASIVE CV LAB;  Service: Cardiovascular;  Laterality: Right;   Mesh implant right leg  2015   THYROIDECTOMY  2013   TUBAL LIGATION  1993    Social History Social History   Tobacco Use   Smoking status: Every Day    Current packs/day: 0.50    Average packs/day: 0.5 packs/day for 45.0 years (22.5 ttl pk-yrs)    Types: Cigarettes   Smokeless tobacco: Never   Tobacco comments:    6 ciggs a day.  Vaping Use   Vaping status: Never Used  Substance Use Topics   Alcohol use: Not Currently   Drug use: Not Currently    Family History Family History  Problem Relation Age of Onset   Heart failure Mother    Depression Mother    Peripheral Artery Disease Sister    Cancer Sister    Clotting disorder Sister    Depression Sister    Thyroid disease Sister     Allergies  Allergen Reactions   Iobenguane I 131 Hives   Advair Diskus [Fluticasone-Salmeterol] Hives   Iodinated Contrast Media Hives   Iodine Hives and Rash  Other reaction(s): Skin Rashes, Hives  Pt states she need premedication for contrast exposure     REVIEW OF SYSTEMS (Negative unless checked)  Constitutional: [] Weight loss  [] Fever  [] Chills Cardiac: [] Chest pain   [] Chest pressure   [] Palpitations   [] Shortness of breath when laying flat   [] Shortness of breath with exertion. Vascular:  [x] Pain in legs with walking   [] Pain in legs at rest  [] History of DVT   [] Phlebitis   [] Swelling in legs   [] Varicose veins   [] Non-healing ulcers Pulmonary:   [] Uses home oxygen   [] Productive cough   [] Hemoptysis   [] Wheeze  [] COPD   [] Asthma Neurologic:  [] Dizziness   [] Seizures   [] History of stroke   [] History of TIA  [] Aphasia   [] Vissual changes   [] Weakness or numbness in arm   [] Weakness or numbness in leg Musculoskeletal:   [] Joint swelling   [] Joint pain   [] Low back pain Hematologic:  [] Easy bruising  [] Easy  bleeding   [] Hypercoagulable state   [] Anemic Gastrointestinal:  [] Diarrhea   [] Vomiting  [] Gastroesophageal reflux/heartburn   [] Difficulty swallowing. Genitourinary:  [] Chronic kidney disease   [] Difficult urination  [] Frequent urination   [] Blood in urine Skin:  [] Rashes   [] Ulcers  Psychological:  [] History of anxiety   []  History of major depression.  Physical Examination  There were no vitals filed for this visit. There is no height or weight on file to calculate BMI. Gen: WD/WN, NAD Head: [place]/AT, No temporalis wasting.  Ear/Nose/Throat: Hearing grossly intact, nares w/o erythema or drainage Eyes: PER, EOMI, sclera nonicteric.  Neck: Supple, no masses.  No bruit or JVD.  Pulmonary:  Good air movement, no audible wheezing, no use of accessory muscles.  Cardiac: RRR, normal S1, S2, no Murmurs. Vascular:  mild trophic changes, no open wounds Vessel Right Left  Radial Palpable Palpable  PT Not Palpable Not Palpable  DP Not Palpable Not Palpable  Gastrointestinal: soft, non-distended. No guarding/no peritoneal signs.  Musculoskeletal: M/S 5/5 throughout.  No visible deformity.  Neurologic: CN 2-12 intact. Pain and light touch intact in extremities.  Symmetrical.  Speech is fluent. Motor exam as listed above. Psychiatric: Judgment intact, Mood & affect appropriate for pt's clinical situation. Dermatologic: No rashes or ulcers noted.  No changes consistent with cellulitis.   CBC Lab Results  Component Value Date   WBC 10.5 09/06/2022   HGB 12.9 09/06/2022   HCT 39.0 09/06/2022   MCV 89 09/06/2022   PLT 281 09/06/2022    BMET    Component Value Date/Time   NA 137 09/06/2022 1324   K 5.0 09/06/2022 1324   CL 103 09/06/2022 1324   CO2 21 09/06/2022 1324   GLUCOSE 139 (H) 09/06/2022 1324   GLUCOSE 335 (H) 07/29/2021 0409   BUN 32 (H) 09/06/2022 1324   CREATININE 1.33 (H) 09/06/2022 1324   CALCIUM 9.4 09/06/2022 1324   GFRNONAA 44 (L) 02/20/2022 0744   GFRAA >60  01/20/2019 0529   CrCl cannot be calculated (Patient's most recent lab result is older than the maximum 21 days allowed.).  COAG No results found for: "INR", "PROTIME"  Radiology No results found.   Assessment/Plan 1. Atherosclerosis of native artery of right lower extremity with rest pain (HCC) (Primary) Recommend:   The patient has evidence of severe atherosclerotic changes of both lower extremities with rest pain that is associated with preulcerative changes and impending tissue loss of the right foot.  This represents a limb threatening ischemia and places  the patient at the risk for right limb loss.   Patient should undergo angiography of the right lower extremity with the hope for intervention for limb salvage.  The risks and benefits as well as the alternative therapies was discussed in detail with the patient.  All questions were answered.  Patient agrees to proceed with right lower extremity angiography.   The patient will follow up with me in the office after the procedure.    2. Type 2 diabetes mellitus with diabetic peripheral angiopathy without gangrene, without long-term current use of insulin (HCC) Continue hypoglycemic medications as already ordered, these medications have been reviewed and there are no changes at this time.   Hgb A1C to be monitored as already arranged by primary service   3. Chronic obstructive pulmonary disease, unspecified COPD type (HCC) Continue pulmonary medications and aerosols as already ordered, these medications have been reviewed and there are no changes at this time.     4. Mixed hyperlipidemia Continue statin as ordered and reviewed, no changes at this time   5. Gastroesophageal reflux disease without esophagitis Continue PPI as already ordered, this medication has been reviewed and there are no changes at this time.   Avoidence of caffeine and alcohol   Moderate elevation of the head of the bed    Levora Dredge,  MD  02/05/2023 8:30 AM

## 2023-02-05 NOTE — OR Nursing (Signed)
Md notified of GFR, increased NS to 200 ml/hr

## 2023-02-24 ENCOUNTER — Other Ambulatory Visit: Payer: Self-pay | Admitting: Cardiovascular Disease

## 2023-02-24 DIAGNOSIS — I10 Essential (primary) hypertension: Secondary | ICD-10-CM

## 2023-03-06 NOTE — Progress Notes (Signed)
 MRN : [id number]  Shannon Russell is a 63 y.o. ([birth year]) female who presents with chief complaint of check circulation.  History of Present Illness:   The patient returns to the office for followup and review status post angiogram with intervention on 02/05/2023.   Procedure: 1.  Percutaneous transluminal angioplasty and stent placement right superficial femoral and popliteal arteries to 4 mm              2.  Percutaneous transluminal angioplasty right anterior tibial artery to 3 mm   The patient notes improvement in the lower extremity symptoms. No interval shortening of the patient's claudication distance or rest pain symptoms. No new ulcers or wounds have occurred since the last visit.  There have been no significant changes to the patient's overall health care.  No documented history of amaurosis fugax or recent TIA symptoms. There are no recent neurological changes noted. No documented history of DVT, PE or superficial thrombophlebitis. The patient denies recent episodes of angina or shortness of breath.    Past Medical History:  Diagnosis Date   Arthritis    Asthma    Cancer (HCC)    COPD (chronic obstructive pulmonary disease) (HCC)    Depression    Diabetes mellitus without complication (HCC)    Emphysema lung (HCC)    H/O blood clots    PAD (peripheral artery disease) (HCC)    Peripheral artery disease (HCC)    Thyroid disease     Past Surgical History:  Procedure Laterality Date   Bypass right leg  2015   CHOLECYSTECTOMY  1981   EXPLORATORY LAPAROTOMY  1978   LOWER EXTREMITY ANGIOGRAPHY Right 02/20/2022   Procedure: Lower Extremity Angiography;  Surgeon: Renford Dills, MD;  Location: [facility] INVASIVE CV LAB;  Service: Cardiovascular;  Laterality: Right;   LOWER EXTREMITY ANGIOGRAPHY Right 02/05/2023   Procedure: Lower Extremity Angiography;  Surgeon: Renford Dills, MD;  Location: [facility]  INVASIVE CV LAB;  Service: Cardiovascular;  Laterality: Right;   Mesh implant right leg  2015   THYROIDECTOMY  2013   TUBAL LIGATION  1993    Social History Social History   Tobacco Use   Smoking status: Every Day    Current packs/day: 0.50    Average packs/day: 0.5 packs/day for 45.0 years (22.5 ttl pk-yrs)    Types: Cigarettes   Smokeless tobacco: Never   Tobacco comments:    6 ciggs a day.  Vaping Use   Vaping status: Never Used  Substance Use Topics   Alcohol use: Not Currently   Drug use: Not Currently    Family History Family History  Problem Relation Age of Onset   Heart failure Mother    Depression Mother    Peripheral Artery Disease Sister    Cancer Sister    Clotting disorder Sister    Depression Sister    Thyroid disease Sister     Allergies  Allergen Reactions   Iobenguane I 131 Hives   Advair Diskus [Fluticasone-Salmeterol] Hives   Iodinated Contrast Media Hives   Iodine Hives and Rash    Other reaction(s): Skin  Rashes, Hives  Pt states she need premedication for contrast exposure     REVIEW OF SYSTEMS (Negative unless checked)  Constitutional: [] Weight loss  [] Fever  [] Chills Cardiac: [] Chest pain   [] Chest pressure   [] Palpitations   [] Shortness of breath when laying flat   [] Shortness of breath with exertion. Vascular:  [x] Pain in legs with walking   [] Pain in legs at rest  [] History of DVT   [] Phlebitis   [] Swelling in legs   [] Varicose veins   [] Non-healing ulcers Pulmonary:   [] Uses home oxygen   [] Productive cough   [] Hemoptysis   [] Wheeze  [x] COPD   [] Asthma Neurologic:  [] Dizziness   [] Seizures   [] History of stroke   [] History of TIA  [] Aphasia   [] Vissual changes   [] Weakness or numbness in arm   [] Weakness or numbness in leg Musculoskeletal:   [] Joint swelling   [] Joint pain   [] Low back pain Hematologic:  [] Easy bruising  [] Easy bleeding   [] Hypercoagulable state   [] Anemic Gastrointestinal:  [] Diarrhea   [] Vomiting   [x] Gastroesophageal reflux/heartburn   [] Difficulty swallowing. Genitourinary:  [] Chronic kidney disease   [] Difficult urination  [] Frequent urination   [] Blood in urine Skin:  [] Rashes   [] Ulcers  Psychological:  [] History of anxiety   []  History of major depression.  Physical Examination  There were no vitals filed for this visit. There is no height or weight on file to calculate BMI. Gen: WD/WN, NAD Head: [place]/AT, No temporalis wasting.  Ear/Nose/Throat: Hearing grossly intact, nares w/o erythema or drainage Eyes: PER, EOMI, sclera nonicteric.  Neck: Supple, no masses.  No bruit or JVD.  Pulmonary:  Good air movement, no audible wheezing, no use of accessory muscles.  Cardiac: RRR, normal S1, S2, no Murmurs. Vascular:  mild trophic changes, no open wounds Vessel Right Left  Radial Palpable Palpable  PT Not Palpable Not Palpable  DP Not Palpable Not Palpable  Gastrointestinal: soft, non-distended. No guarding/no peritoneal signs.  Musculoskeletal: M/S 5/5 throughout.  No visible deformity.  Neurologic: CN 2-12 intact. Pain and light touch intact in extremities.  Symmetrical.  Speech is fluent. Motor exam as listed above. Psychiatric: Judgment intact, Mood & affect appropriate for pt's clinical situation. Dermatologic: No rashes or ulcers noted.  No changes consistent with cellulitis.   CBC Lab Results  Component Value Date   WBC 10.5 09/06/2022   HGB 12.9 09/06/2022   HCT 39.0 09/06/2022   MCV 89 09/06/2022   PLT 281 09/06/2022    BMET    Component Value Date/Time   NA 137 09/06/2022 1324   K 5.0 09/06/2022 1324   CL 103 09/06/2022 1324   CO2 21 09/06/2022 1324   GLUCOSE 139 (H) 09/06/2022 1324   GLUCOSE 335 (H) 07/29/2021 0409   BUN 33 (H) 02/05/2023 0830   BUN 32 (H) 09/06/2022 1324   CREATININE 1.71 (H) 02/05/2023 0830   CALCIUM 9.4 09/06/2022 1324   GFRNONAA 34 (L) 02/05/2023 0830   GFRAA >60 01/20/2019 0529   CrCl cannot be calculated (Patient's most recent  lab result is older than the maximum 21 days allowed.).  COAG No results found for: "INR", "PROTIME"  Radiology PERIPHERAL VASCULAR CATHETERIZATION Result Date: 02/05/2023 See surgical note for result.    Assessment/Plan 1. Atherosclerosis of native artery of right lower extremity with rest pain (HCC) (Primary) Recommend:  The patient is status post successful angiogram with intervention.  The patient reports that the claudication symptoms and leg pain has improved.   The patient  denies lifestyle limiting changes at this point in time.  No further invasive studies, angiography or surgery at this time. The patient should continue walking and begin a more formal exercise program.  The patient should continue antiplatelet therapy and aggressive treatment of the lipid abnormalities  Continued surveillance is indicated as atherosclerosis is likely to progress with time.    Patient should undergo noninvasive studies as ordered. The patient will follow up with me to review the studies.  - VAS [place] ABI WITH/WO TBI; Future - VAS [place] LOWER EXTREMITY ARTERIAL DUPLEX; Future  2. Lymphedema Recommend:  No surgery or intervention at this point in time.    I have reviewed my previous discussion with the patient regarding swelling and why it  causes symptoms.  The patient is doing well with compression and will continue wearing graduated compression on a daily basis. The patient will  continue wearing the compression first thing in the morning and removing them in the evening. The patient is instructed specifically not to sleep in the compression.    In addition, behavioral modification including elevation during the day and exercise as tolerated will be continued.    3. Type 2 diabetes mellitus with diabetic peripheral angiopathy without gangrene, without long-term current use of insulin (HCC) Continue hypoglycemic medications as already ordered, these medications have been reviewed and there are  no changes at this time.  Hgb A1C to be monitored as already arranged by primary service  4. Chronic obstructive pulmonary disease, unspecified COPD type (HCC) Continue pulmonary medications and aerosols as already ordered, these medications have been reviewed and there are no changes at this time.   5. Gastroesophageal reflux disease without esophagitis Continue PPI as already ordered, this medication has been reviewed and there are no changes at this time.  Avoidence of caffeine and alcohol  Moderate elevation of the head of the bed  d   Levora Dredge, MD  03/06/2023 8:40 PM

## 2023-03-07 ENCOUNTER — Encounter (INDEPENDENT_AMBULATORY_CARE_PROVIDER_SITE_OTHER): Payer: Self-pay | Admitting: Vascular Surgery

## 2023-03-07 ENCOUNTER — Ambulatory Visit (INDEPENDENT_AMBULATORY_CARE_PROVIDER_SITE_OTHER): Payer: Medicaid Other | Admitting: Vascular Surgery

## 2023-03-07 VITALS — BP 76/59 | HR 78 | Resp 16

## 2023-03-07 DIAGNOSIS — E1151 Type 2 diabetes mellitus with diabetic peripheral angiopathy without gangrene: Secondary | ICD-10-CM | POA: Diagnosis not present

## 2023-03-07 DIAGNOSIS — I70221 Atherosclerosis of native arteries of extremities with rest pain, right leg: Secondary | ICD-10-CM

## 2023-03-07 DIAGNOSIS — J449 Chronic obstructive pulmonary disease, unspecified: Secondary | ICD-10-CM

## 2023-03-07 DIAGNOSIS — I890 Lymphedema, not elsewhere classified: Secondary | ICD-10-CM

## 2023-03-07 DIAGNOSIS — K219 Gastro-esophageal reflux disease without esophagitis: Secondary | ICD-10-CM

## 2023-03-10 ENCOUNTER — Encounter (INDEPENDENT_AMBULATORY_CARE_PROVIDER_SITE_OTHER): Payer: Self-pay | Admitting: Vascular Surgery

## 2023-03-19 ENCOUNTER — Other Ambulatory Visit: Payer: Self-pay | Admitting: Family

## 2023-03-25 ENCOUNTER — Ambulatory Visit: Payer: Medicaid Other | Admitting: Cardiology

## 2023-03-25 ENCOUNTER — Other Ambulatory Visit: Payer: Self-pay | Admitting: Cardiovascular Disease

## 2023-03-26 ENCOUNTER — Encounter: Payer: Self-pay | Admitting: Cardiology

## 2023-03-26 ENCOUNTER — Ambulatory Visit (INDEPENDENT_AMBULATORY_CARE_PROVIDER_SITE_OTHER): Admitting: Cardiology

## 2023-03-26 VITALS — BP 112/68 | HR 87 | Ht 63.0 in | Wt 169.0 lb

## 2023-03-26 DIAGNOSIS — F17208 Nicotine dependence, unspecified, with other nicotine-induced disorders: Secondary | ICD-10-CM

## 2023-03-26 DIAGNOSIS — Z794 Long term (current) use of insulin: Secondary | ICD-10-CM

## 2023-03-26 DIAGNOSIS — E1159 Type 2 diabetes mellitus with other circulatory complications: Secondary | ICD-10-CM | POA: Diagnosis not present

## 2023-03-26 DIAGNOSIS — E039 Hypothyroidism, unspecified: Secondary | ICD-10-CM

## 2023-03-26 DIAGNOSIS — Z0130 Encounter for examination of blood pressure without abnormal findings: Secondary | ICD-10-CM

## 2023-03-26 DIAGNOSIS — Z1231 Encounter for screening mammogram for malignant neoplasm of breast: Secondary | ICD-10-CM | POA: Diagnosis not present

## 2023-03-26 DIAGNOSIS — E782 Mixed hyperlipidemia: Secondary | ICD-10-CM | POA: Diagnosis not present

## 2023-03-26 DIAGNOSIS — Z1211 Encounter for screening for malignant neoplasm of colon: Secondary | ICD-10-CM

## 2023-03-26 LAB — POCT UA - MICROALBUMIN
Albumin/Creatinine Ratio, Urine, POC: <30
Creatinine, POC: 50 mg/dL
Microalbumin Ur, POC: 10 mg/L

## 2023-03-26 MED ORDER — ATORVASTATIN CALCIUM 80 MG PO TABS
80.0000 mg | ORAL_TABLET | Freq: Every day | ORAL | 3 refills | Status: AC
Start: 2023-03-26 — End: ?

## 2023-03-26 NOTE — Progress Notes (Addendum)
 Established Patient Office Visit  Subjective:  Patient ID: Shannon Russell, female    DOB: 17-Apr-1960  Age: 63 y.o. MRN: [id number]  Chief Complaint  Patient presents with   Follow-up    3 Months Follow Up    Patient in office for 3 month follow up. Patient did not have blood work done. Will return when fasting.  Patient due for colonoscopy, will send referral.  Patient due for mammogram, will send referral.  Patient due for low dose lung cancer screening  Will get urine micro albumin today.  Patient is using and benefiting from using oxygen, will continue.      No other concerns at this time.   Past Medical History:  Diagnosis Date   Arthritis    Asthma    Cancer (HCC)    COPD (chronic obstructive pulmonary disease) (HCC)    Depression    Diabetes mellitus without complication (HCC)    Emphysema lung (HCC)    H/O blood clots    PAD (peripheral artery disease) (HCC)    Peripheral artery disease (HCC)    Thyroid disease     Past Surgical History:  Procedure Laterality Date   Bypass right leg  2015   CHOLECYSTECTOMY  1981   EXPLORATORY LAPAROTOMY  1978   LOWER EXTREMITY ANGIOGRAPHY Right 02/20/2022   Procedure: Lower Extremity Angiography;  Surgeon: Renford Dills, MD;  Location: [facility] INVASIVE CV LAB;  Service: Cardiovascular;  Laterality: Right;   LOWER EXTREMITY ANGIOGRAPHY Right 02/05/2023   Procedure: Lower Extremity Angiography;  Surgeon: Renford Dills, MD;  Location: [facility] INVASIVE CV LAB;  Service: Cardiovascular;  Laterality: Right;   Mesh implant right leg  2015   THYROIDECTOMY  2013   TUBAL LIGATION  1993    Social History   Socioeconomic History   Marital status: Legally Separated    Spouse name: Not on file   Number of children: Not on file   Years of education: Not on file   Highest education level: Not on file  Occupational History   Not on file  Tobacco Use   Smoking status: Every Day    Current packs/day: 0.50    Average packs/day:  0.5 packs/day for 45.0 years (22.5 ttl pk-yrs)    Types: Cigarettes   Smokeless tobacco: Never   Tobacco comments:    6 ciggs a day.  Vaping Use   Vaping status: Never Used  Substance and Sexual Activity   Alcohol use: Not Currently   Drug use: Not Currently   Sexual activity: Not on file  Other Topics Concern   Not on file  Social History Narrative   Not on file   Social Drivers of Health   Financial Resource Strain: Not on file  Food Insecurity: Not on file  Transportation Needs: Not on file  Physical Activity: Not on file  Stress: Not on file  Social Connections: Not on file  Intimate Partner Violence: Not on file    Family History  Problem Relation Age of Onset   Heart failure Mother    Depression Mother    Peripheral Artery Disease Sister    Cancer Sister    Clotting disorder Sister    Depression Sister    Thyroid disease Sister     Allergies  Allergen Reactions   Iobenguane I 131 Hives   Advair Diskus [Fluticasone-Salmeterol] Hives   Iodinated Contrast Media Hives   Iodine Hives and Rash    Other reaction(s): Skin Rashes, Hives  Pt states she  need premedication for contrast exposure    Outpatient Medications Prior to Visit  Medication Sig   Accu-Chek Softclix Lancets lancets Use as instructed   albuterol (VENTOLIN HFA) 108 (90 Base) MCG/ACT inhaler Inhale 2 puffs into the lungs every 6 (six) hours as needed for wheezing.   aspirin EC 81 MG tablet Take 81 mg by mouth daily. Swallow whole.   baclofen (LIORESAL) 10 MG tablet Take 1 tablet (10 mg total) by mouth every 12 (twelve) hours as needed for muscle spasms.   budesonide (PULMICORT) 0.5 MG/2ML nebulizer solution Take 4 mLs (1 mg total) by nebulization 2 (two) times daily.   Cholecalciferol 1.25 MG (50000 UT) capsule Vitamin D3 1.25 MG (50000 UT) Oral Capsule QTY: 0 capsule Days: 0 Refills: 0  Written: 02/09/20 Patient Instructions: qaw   cloNIDine (CATAPRES) 0.1 MG tablet Take 1 tablet (0.1 mg total)  by mouth at bedtime.   clopidogrel (PLAVIX) 75 MG tablet Take 1 tablet by mouth once daily   fexofenadine (ALLEGRA) 180 MG tablet Take 1 tablet (180 mg total) by mouth every morning.   furosemide (LASIX) 40 MG tablet Take 1 tablet by mouth once daily   glucose blood test strip Use as instructed   insulin glargine (LANTUS SOLOSTAR) 100 UNIT/ML Solostar Pen Inject 18 Units into the skin at bedtime.   Insulin Pen Needle (B-D UF III MINI PEN NEEDLES) 31G X 5 MM MISC USE AS DIRECTED   ipratropium-albuterol (DUONEB) 0.5-2.5 (3) MG/3ML SOLN Take 3 mLs by nebulization every 6 (six) hours as needed.   levothyroxine (SYNTHROID) 100 MCG tablet Take 1 tablet (100 mcg total) by mouth daily before breakfast.   metFORMIN (GLUCOPHAGE) 1000 MG tablet Take 1 tablet (1,000 mg total) by mouth every morning.   methocarbamol (ROBAXIN) 500 MG tablet Take 1 tablet by mouth every 8 (eight) hours.   montelukast (SINGULAIR) 10 MG tablet Take 1 tablet (10 mg total) by mouth at bedtime.   ofloxacin (FLOXIN) 0.3 % OTIC solution Place 5 drops into both ears 2 (two) times daily. (Patient not taking: Reported on 02/05/2023)   oxyCODONE-acetaminophen (PERCOCET) 10-325 MG tablet Take 1 tablet by mouth 4 (four) times daily as needed.   pantoprazole (PROTONIX) 40 MG tablet Take 1 tablet (40 mg total) by mouth 2 (two) times daily.   pregabalin (LYRICA) 75 MG capsule TAKE 1 CAPSULE BY MOUTH THREE TIMES DAILY   SPIRIVA HANDIHALER 18 MCG inhalation capsule Place 1 capsule (18 mcg total) into inhaler and inhale daily.   temazepam (RESTORIL) 15 MG capsule Take 15 mg by mouth at bedtime as needed.   TRELEGY ELLIPTA 100-62.5-25 MCG/ACT AEPB Inhale 1 puff into the lungs daily.   triamcinolone ointment (KENALOG) 0.5 % Apply 1 Application topically 2 (two) times daily.   [DISCONTINUED] atorvastatin (LIPITOR) 80 MG tablet Take 1 tablet (80 mg total) by mouth daily.   [DISCONTINUED] HYDROcodone-acetaminophen (NORCO) 5-325 MG tablet Take 1 tablet  by mouth every 6 (six) hours as needed for moderate pain (pain score 4-6). (Patient not taking: Reported on 03/07/2023)   [DISCONTINUED] lisinopril (ZESTRIL) 5 MG tablet Take 1 tablet by mouth once daily (Patient not taking: Reported on 03/26/2023)   No facility-administered medications prior to visit.    Review of Systems  Constitutional: Negative.   HENT: Negative.    Eyes: Negative.   Respiratory: Negative.  Negative for shortness of breath.   Cardiovascular: Negative.  Negative for chest pain.  Gastrointestinal: Negative.  Negative for abdominal pain, constipation and diarrhea.  Genitourinary: Negative.   Musculoskeletal:  Negative for joint pain and myalgias.  Skin: Negative.   Neurological: Negative.  Negative for dizziness and headaches.  Endo/Heme/Allergies: Negative.   All other systems reviewed and are negative.      Objective:   BP 112/68   Pulse 87   Ht 5\' 3"  (1.6 m)   Wt 169 lb (76.7 kg)   SpO2 95%   BMI 29.94 kg/m   Vitals:   03/26/23 1308  BP: 112/68  Pulse: 87  Height: 5\' 3"  (1.6 m)  Weight: 169 lb (76.7 kg)  SpO2: 95%  BMI (Calculated): 29.94    Physical Exam Vitals and nursing note reviewed.  Constitutional:      Appearance: Normal appearance. She is normal weight.  HENT:     Head: Normocephalic and atraumatic.     Nose: Nose normal.     Mouth/Throat:     Mouth: Mucous membranes are moist.  Eyes:     Extraocular Movements: Extraocular movements intact.     Conjunctiva/sclera: Conjunctivae normal.     Pupils: Pupils are equal, round, and reactive to light.  Cardiovascular:     Rate and Rhythm: Normal rate and regular rhythm.     Pulses: Normal pulses.     Heart sounds: Normal heart sounds.  Pulmonary:     Effort: Pulmonary effort is normal.     Breath sounds: Wheezing present.  Abdominal:     General: Abdomen is flat. Bowel sounds are normal.     Palpations: Abdomen is soft.  Musculoskeletal:        General: Normal range of motion.      Cervical back: Normal range of motion.  Skin:    General: Skin is warm and dry.  Neurological:     General: No focal deficit present.     Mental Status: She is alert and oriented to person, place, and time.  Psychiatric:        Mood and Affect: Mood normal.        Behavior: Behavior normal.        Thought Content: Thought content normal.        Judgment: Judgment normal.      Results for orders placed or performed in visit on 03/26/23  POCT Urine Albumin/Creatinine with ratio [[id number]]  Result Value Ref Range   Microalbumin Ur, POC 10 mg/L   Creatinine, POC 50 mg/dL   Albumin/Creatinine Ratio, Urine, POC <30     Recent Results (from the past 2160 hours)  BUN     Status: Abnormal   Collection Time: 02/05/23  8:30 AM  Result Value Ref Range   BUN 33 (H) 8 - 23 mg/dL    Comment: Performed at [facility], [address] [place], [place] [address]  Creatinine, serum     Status: Abnormal   Collection Time: 02/05/23  8:30 AM  Result Value Ref Range   Creatinine, Ser 1.71 (H) 0.44 - 1.00 mg/dL   GFR, Estimated 34 (L) >60 mL/min    Comment: (NOTE) Calculated using the CKD-EPI Creatinine Equation (2021) Performed at [facility], [address] [place], [place] [address]   Glucose, capillary     Status: Abnormal   Collection Time: 02/05/23  8:35 AM  Result Value Ref Range   Glucose-Capillary 203 (H) 70 - 99 mg/dL    Comment: Glucose reference range applies only to samples taken after fasting for at least 8 hours.  POCT Urine Albumin/Creatinine with ratio [[id number]]  Status: Normal   Collection Time: 03/26/23  1:52 PM  Result Value Ref Range   Microalbumin Ur, POC 10 mg/L   Creatinine, POC 50 mg/dL   Albumin/Creatinine Ratio, Urine, POC <30       Assessment & Plan:  Colonoscopy referral sent.  Mammogram order sent. Low dose lung cancer screening order sent.  Urine micro albumin today.  Problem List Items Addressed This Visit        Endocrine   Hypothyroidism - Primary (Chronic)   Diabetes (HCC)   Relevant Medications   atorvastatin (LIPITOR) 80 MG tablet   Other Relevant Orders   POCT Urine Albumin/Creatinine with ratio [[id number]] (Completed)     Other   Nicotine dependence   Relevant Orders   CT CHEST LUNG CA SCREEN LOW DOSE W/O CM   Hyperlipidemia   Relevant Medications   atorvastatin (LIPITOR) 80 MG tablet   Other Visit Diagnoses       Breast cancer screening by mammogram       Relevant Orders   MM 3D SCREENING MAMMOGRAM BILATERAL BREAST     Colon cancer screening       Relevant Orders   Ambulatory referral to Gastroenterology       Return in about 3 months (around 06/26/2023).   Total time spent: 25 minutes  [employer], NP  03/26/2023   This document may have been prepared by Dragon Voice Recognition software and as such may include unintentional dictation errors.

## 2023-03-30 ENCOUNTER — Other Ambulatory Visit: Payer: Self-pay | Admitting: Cardiology

## 2023-04-09 ENCOUNTER — Other Ambulatory Visit: Payer: Self-pay | Admitting: Cardiology

## 2023-04-11 ENCOUNTER — Encounter (INDEPENDENT_AMBULATORY_CARE_PROVIDER_SITE_OTHER): Payer: Medicaid Other

## 2023-04-13 ENCOUNTER — Other Ambulatory Visit: Payer: Self-pay | Admitting: Cardiology

## 2023-04-15 ENCOUNTER — Telehealth: Payer: Self-pay

## 2023-04-15 ENCOUNTER — Other Ambulatory Visit: Payer: Self-pay

## 2023-04-15 NOTE — Telephone Encounter (Signed)
 Patient LM asking for something to be called in for her cough and congestion, she gets this every year and is requesting to get abx and steroid called in to the pharmacy,

## 2023-04-15 NOTE — Telephone Encounter (Signed)
 Can you amend the patients last note to state that she is using and benefiting from using oxygen and LMK when its done so I can fax it to [facility] [phone] Fax # Phone # [phone] ext [phone]

## 2023-04-16 ENCOUNTER — Other Ambulatory Visit: Payer: Self-pay | Admitting: Cardiology

## 2023-04-16 ENCOUNTER — Encounter (INDEPENDENT_AMBULATORY_CARE_PROVIDER_SITE_OTHER)

## 2023-04-16 MED ORDER — AMOXICILLIN-POT CLAVULANATE 875-125 MG PO TABS: 1.0000 | ORAL_TABLET | Freq: Two times a day (BID) | ORAL | 0 refills | Status: AC

## 2023-04-16 MED ORDER — METHYLPREDNISOLONE 4 MG PO TBPK: ORAL_TABLET | ORAL | 0 refills | Status: DC

## 2023-04-17 ENCOUNTER — Other Ambulatory Visit: Payer: Self-pay | Admitting: Family

## 2023-04-18 ENCOUNTER — Ambulatory Visit (INDEPENDENT_AMBULATORY_CARE_PROVIDER_SITE_OTHER): Payer: Medicaid Other | Admitting: Vascular Surgery

## 2023-04-28 ENCOUNTER — Other Ambulatory Visit: Payer: Self-pay | Admitting: Cardiovascular Disease

## 2023-04-29 ENCOUNTER — Other Ambulatory Visit: Payer: Self-pay | Admitting: Cardiology

## 2023-05-01 ENCOUNTER — Telehealth: Payer: Self-pay | Admitting: General Practice

## 2023-05-01 NOTE — Telephone Encounter (Signed)
 Patient left VM that she just completed some antibiotics and now she has a yeast infection. She would like a 2 day Rx sent in for fluconazole . Please advise.

## 2023-05-02 ENCOUNTER — Other Ambulatory Visit: Payer: Self-pay | Admitting: Cardiology

## 2023-05-02 MED ORDER — FLUCONAZOLE 150 MG PO TABS: ORAL_TABLET | ORAL | 0 refills | Status: DC

## 2023-05-08 ENCOUNTER — Other Ambulatory Visit: Payer: Self-pay | Admitting: Cardiology

## 2023-05-14 ENCOUNTER — Telehealth: Payer: Self-pay

## 2023-05-14 NOTE — Telephone Encounter (Signed)
 Patient called asking for stronger abx that the augmentin  and also for  a steriod, she said the stuff in her lungs is acting up again, if she needs an appt please send this message to the front desk.

## 2023-05-16 ENCOUNTER — Other Ambulatory Visit: Payer: Self-pay | Admitting: Cardiology

## 2023-05-16 MED ORDER — METHYLPREDNISOLONE 4 MG PO TBPK
ORAL_TABLET | ORAL | 0 refills | Status: DC
Start: 2023-05-16 — End: 2023-05-30

## 2023-05-16 MED ORDER — LEVOFLOXACIN 250 MG PO TABS: 250.0000 mg | ORAL_TABLET | Freq: Every day | ORAL | 0 refills | Status: DC

## 2023-05-24 ENCOUNTER — Other Ambulatory Visit

## 2023-05-24 ENCOUNTER — Ambulatory Visit: Admitting: Cardiology

## 2023-05-25 LAB — CMP14+EGFR
ALT: 18 IU/L (ref 0–32)
AST: 12 IU/L (ref 0–40)
Albumin: 3.8 g/dL — ABNORMAL LOW (ref 3.9–4.9)
Alkaline Phosphatase: 119 IU/L (ref 44–121)
BUN/Creatinine Ratio: 15 (ref 12–28)
BUN: 17 mg/dL (ref 8–27)
Bilirubin Total: 0.2 mg/dL (ref 0.0–1.2)
CO2: 24 mmol/L (ref 20–29)
Calcium: 9.2 mg/dL (ref 8.7–10.3)
Chloride: 98 mmol/L (ref 96–106)
Creatinine, Ser: 1.12 mg/dL — ABNORMAL HIGH (ref 0.57–1.00)
Globulin, Total: 2.4 g/dL (ref 1.5–4.5)
Glucose: 138 mg/dL — ABNORMAL HIGH (ref 70–99)
Potassium: 3.8 mmol/L (ref 3.5–5.2)
Sodium: 139 mmol/L (ref 134–144)
Total Protein: 6.2 g/dL (ref 6.0–8.5)
eGFR: 56 mL/min/{1.73_m2} — ABNORMAL LOW (ref >59)

## 2023-05-25 LAB — TSH: TSH: 10.2 u[IU]/mL — ABNORMAL HIGH (ref 0.450–4.500)

## 2023-05-25 LAB — LIPID PANEL
Chol/HDL Ratio: 4.7 ratio — ABNORMAL HIGH (ref 0.0–4.4)
Cholesterol, Total: 140 mg/dL (ref 100–199)
HDL: 30 mg/dL — ABNORMAL LOW (ref >39)
LDL Chol Calc (NIH): 45 mg/dL (ref 0–99)
Triglycerides: 440 mg/dL — ABNORMAL HIGH (ref 0–149)
VLDL Cholesterol Cal: 65 mg/dL — ABNORMAL HIGH (ref 5–40)

## 2023-05-25 LAB — CBC WITH DIFFERENTIAL/PLATELET
Basophils Absolute: 0.1 10*3/uL (ref 0.0–0.2)
Basos: 0 %
EOS (ABSOLUTE): 0.3 10*3/uL (ref 0.0–0.4)
Eos: 2 %
Hematocrit: 42.3 % (ref 34.0–46.6)
Hemoglobin: 14.1 g/dL (ref 11.1–15.9)
Immature Grans (Abs): 0.2 10*3/uL — ABNORMAL HIGH (ref 0.0–0.1)
Immature Granulocytes: 1 %
Lymphocytes Absolute: 3.9 10*3/uL — ABNORMAL HIGH (ref 0.7–3.1)
Lymphs: 29 %
MCH: 29.8 pg (ref 26.6–33.0)
MCHC: 33.3 g/dL (ref 31.5–35.7)
MCV: 89 fL (ref 79–97)
Monocytes Absolute: 0.6 10*3/uL (ref 0.1–0.9)
Monocytes: 4 %
Neutrophils Absolute: 8.5 10*3/uL — ABNORMAL HIGH (ref 1.4–7.0)
Neutrophils: 64 %
Platelets: 316 10*3/uL (ref 150–450)
RBC: 4.73 x10E6/uL (ref 3.77–5.28)
RDW: 15.8 % — ABNORMAL HIGH (ref 11.7–15.4)
WBC: 13.5 10*3/uL — ABNORMAL HIGH (ref 3.4–10.8)

## 2023-05-25 LAB — HEMOGLOBIN A1C
Est. average glucose Bld gHb Est-mCnc: 200 mg/dL
Hgb A1c MFr Bld: 8.6 % — ABNORMAL HIGH (ref 4.8–5.6)

## 2023-05-26 ENCOUNTER — Other Ambulatory Visit: Payer: Self-pay | Admitting: Cardiology

## 2023-05-27 ENCOUNTER — Ambulatory Visit: Payer: Self-pay | Admitting: Cardiology

## 2023-05-27 ENCOUNTER — Ambulatory Visit: Admitting: Cardiology

## 2023-05-28 ENCOUNTER — Encounter (INDEPENDENT_AMBULATORY_CARE_PROVIDER_SITE_OTHER): Payer: Self-pay

## 2023-05-30 ENCOUNTER — Ambulatory Visit (INDEPENDENT_AMBULATORY_CARE_PROVIDER_SITE_OTHER): Admitting: Cardiology

## 2023-05-30 ENCOUNTER — Encounter: Payer: Self-pay | Admitting: Cardiology

## 2023-05-30 VITALS — BP 114/78 | HR 95 | Ht 63.0 in | Wt 164.0 lb

## 2023-05-30 DIAGNOSIS — E782 Mixed hyperlipidemia: Secondary | ICD-10-CM | POA: Diagnosis not present

## 2023-05-30 DIAGNOSIS — E039 Hypothyroidism, unspecified: Secondary | ICD-10-CM | POA: Diagnosis not present

## 2023-05-30 DIAGNOSIS — Z0130 Encounter for examination of blood pressure without abnormal findings: Secondary | ICD-10-CM

## 2023-05-30 DIAGNOSIS — E1151 Type 2 diabetes mellitus with diabetic peripheral angiopathy without gangrene: Secondary | ICD-10-CM

## 2023-05-30 DIAGNOSIS — Z1211 Encounter for screening for malignant neoplasm of colon: Secondary | ICD-10-CM

## 2023-05-30 DIAGNOSIS — Z794 Long term (current) use of insulin: Secondary | ICD-10-CM

## 2023-05-30 MED ORDER — LANTUS SOLOSTAR 100 UNIT/ML ~~LOC~~ SOPN: 22.0000 [IU] | PEN_INJECTOR | Freq: Every day | SUBCUTANEOUS | 6 refills | Status: DC

## 2023-05-30 MED ORDER — FLUCONAZOLE 150 MG PO TABS: ORAL_TABLET | ORAL | 4 refills | Status: DC

## 2023-05-30 MED ORDER — LEVOTHYROXINE SODIUM 125 MCG PO TABS: 125.0000 ug | ORAL_TABLET | Freq: Every day | ORAL | 11 refills | Status: AC

## 2023-05-30 NOTE — Progress Notes (Addendum)
 Established Patient Office Visit  Subjective:  Patient ID: Shannon Russell, female    DOB: Apr 19, 1960  Age: 63 y.o. MRN: [id number]  Chief Complaint  Patient presents with   Follow-up    Follow Up    Patient in office for regular follow up, discuss recent lab work. Patient doing well, no acute complaints.  Over due for colon cancer screening, referral sent for colonoscopy.  Discussed recent lab work. TSH abnormal. Will increase levothyroxine  to 125 mcg daily. Repeat TSH in 4 weeks after increasing dose.  Hgb A1c improved but still elevated. Will increase Lantus  to 22 units at bedtime.  Patient thinks she may have a yeast infection due to recent antibiotic use. Will send in Diflucan .  Continue all other medications.  Patient is still using her oxygen and benefiting from it    No other concerns at this time.   Past Medical History:  Diagnosis Date   Arthritis    Asthma    Cancer (HCC)    COPD (chronic obstructive pulmonary disease) (HCC)    Depression    Diabetes mellitus without complication (HCC)    Emphysema lung (HCC)    H/O blood clots    Obesity, Class I, BMI 30-34.9 07/27/2021   PAD (peripheral artery disease) (HCC)    Peripheral artery disease (HCC)    Thyroid disease     Past Surgical History:  Procedure Laterality Date   Bypass right leg  2015   CHOLECYSTECTOMY  1981   EXPLORATORY LAPAROTOMY  1978   LOWER EXTREMITY ANGIOGRAPHY Right 02/20/2022   Procedure: Lower Extremity Angiography;  Surgeon: Jackquelyn Mass, MD;  Location: [facility];  Service: Cardiovascular;  Laterality: Right;   LOWER EXTREMITY ANGIOGRAPHY Right 02/05/2023   Procedure: Lower Extremity Angiography;  Surgeon: Jackquelyn Mass, MD;  Location: [facility];  Service: Cardiovascular;  Laterality: Right;   Mesh implant right leg  2015   THYROIDECTOMY  2013   TUBAL LIGATION  1993    Social History   Socioeconomic History   Marital status: Legally Separated     Spouse name: Not on file   Number of children: Not on file   Years of education: Not on file   Highest education level: Not on file  Occupational History   Not on file  Tobacco Use   Smoking status: Every Day    Current packs/day: 0.50    Average packs/day: 0.5 packs/day for 45.0 years (22.5 ttl pk-yrs)    Types: Cigarettes   Smokeless tobacco: Never   Tobacco comments:    6 ciggs a day.  Vaping Use   Vaping status: Never Used  Substance and Sexual Activity   Alcohol use: Not Currently   Drug use: Not Currently   Sexual activity: Not on file  Other Topics Concern   Not on file  Social History Narrative   Not on file   Social Drivers of Health   Financial Resource Strain: Not on file  Food Insecurity: Not on file  Transportation Needs: Not on file  Physical Activity: Not on file  Stress: Not on file  Social Connections: Not on file  Intimate Partner Violence: Not on file    Family History  Problem Relation Age of Onset   Heart failure Mother    Depression Mother    Peripheral Artery Disease Sister    Cancer Sister    Clotting disorder Sister    Depression Sister    Thyroid disease Sister  Allergies  Allergen Reactions   Iobenguane I 131 Hives   Advair Diskus [Fluticasone -Salmeterol] Hives   Iodinated Contrast Media Hives   Iodine  Hives and Rash    Other reaction(s): Skin Rashes, Hives  Pt states she need premedication for contrast exposure    Outpatient Medications Prior to Visit  Medication Sig   Accu-Chek Softclix Lancets lancets Use as instructed   albuterol  (VENTOLIN  HFA) 108 (90 Base) MCG/ACT inhaler Inhale 2 puffs into the lungs every 6 (six) hours as needed for wheezing.   aspirin  EC 81 MG tablet Take 81 mg by mouth daily. Swallow whole.   atorvastatin  (LIPITOR) 80 MG tablet Take 1 tablet (80 mg total) by mouth daily.   B-D UF III MINI PEN NEEDLES 31G X 5 MM MISC USE AS DIRECTED   baclofen  (LIORESAL ) 10 MG tablet Take 1 tablet (10 mg total) by  mouth every 12 (twelve) hours as needed for muscle spasms.   budesonide  (PULMICORT ) 1 MG/2ML nebulizer solution INHALE BY NEBULIZATION TWICE A DAY   Cholecalciferol  1.25 MG (50000 UT) capsule Vitamin D3 1.25 MG (50000 UT) Oral Capsule QTY: 0 capsule Days: 0 Refills: 0  Written: 02/09/20 Patient Instructions: qaw   cloNIDine  (CATAPRES ) 0.1 MG tablet Take 1 tablet (0.1 mg total) by mouth at bedtime.   clopidogrel  (PLAVIX ) 75 MG tablet Take 1 tablet by mouth once daily   fexofenadine  (ALLEGRA ) 180 MG tablet Take 1 tablet (180 mg total) by mouth every morning.   furosemide  (LASIX ) 40 MG tablet Take 1 tablet by mouth once daily   glucose blood test strip Use as instructed   ipratropium-albuterol  (DUONEB) 0.5-2.5 (3) MG/3ML SOLN Take 3 mLs by nebulization every 6 (six) hours as needed.   metFORMIN  (GLUCOPHAGE ) 1000 MG tablet Take 1 tablet (1,000 mg total) by mouth every morning.   methocarbamol (ROBAXIN) 500 MG tablet Take 1 tablet by mouth every 8 (eight) hours.   montelukast  (SINGULAIR ) 10 MG tablet Take 1 tablet (10 mg total) by mouth at bedtime.   oxyCODONE -acetaminophen  (PERCOCET) 10-325 MG tablet Take 1 tablet by mouth 4 (four) times daily as needed.   pantoprazole  (PROTONIX ) 40 MG tablet Take 1 tablet by mouth twice daily   pregabalin  (LYRICA ) 75 MG capsule TAKE 1 CAPSULE BY MOUTH THREE TIMES DAILY   SPIRIVA  HANDIHALER 18 MCG inhalation capsule Inhale 1 puff by mouth once daily   temazepam (RESTORIL) 15 MG capsule Take 15 mg by mouth at bedtime as needed.   TRELEGY ELLIPTA  100-62.5-25 MCG/ACT AEPB Inhale 1 puff into the lungs daily.   triamcinolone  ointment (KENALOG ) 0.5 % Apply 1 Application topically 2 (two) times daily.   [DISCONTINUED] fluconazole  (DIFLUCAN ) 150 MG tablet Take one tablet every 72 hours   [DISCONTINUED] insulin  glargine (LANTUS  SOLOSTAR) 100 UNIT/ML Solostar Pen Inject 18 Units into the skin at bedtime.   [DISCONTINUED] levothyroxine  (SYNTHROID ) 100 MCG tablet Take 1  tablet (100 mcg total) by mouth daily before breakfast.   [DISCONTINUED] methylPREDNISolone  (MEDROL  DOSEPAK) 4 MG TBPK tablet As directed   [DISCONTINUED] ofloxacin  (FLOXIN ) 0.3 % OTIC solution Place 5 drops into both ears 2 (two) times daily. (Patient not taking: Reported on 02/05/2023)   [DISCONTINUED] Pseudoeph-Bromphen-DM (BROMPHENIRAMINE-DM-PSE PO) TAKE 5 ML BY MOUTH EVERY 6 HOURS AS NEEDED FOR COUGH   No facility-administered medications prior to visit.    Review of Systems  Constitutional: Negative.   HENT: Negative.    Eyes: Negative.   Respiratory: Negative.  Negative for shortness of breath.   Cardiovascular: Negative.  Negative for chest pain.  Gastrointestinal: Negative.  Negative for abdominal pain, constipation and diarrhea.  Genitourinary: Negative.   Musculoskeletal:  Negative for joint pain and myalgias.  Skin: Negative.   Neurological: Negative.  Negative for dizziness and headaches.  Endo/Heme/Allergies: Negative.   All other systems reviewed and are negative.      Objective:   BP 114/78   Pulse 95   Ht 5\' 3"  (1.6 m)   Wt 164 lb (74.4 kg)   SpO2 97%   BMI 29.05 kg/m   Vitals:   05/30/23 1310  BP: 114/78  Pulse: 95  Height: 5\' 3"  (1.6 m)  Weight: 164 lb (74.4 kg)  SpO2: 97%  BMI (Calculated): 29.06    Physical Exam Vitals and nursing note reviewed.  Constitutional:      Appearance: Normal appearance. She is normal weight.  HENT:     Head: Normocephalic and atraumatic.     Nose: Nose normal.     Mouth/Throat:     Mouth: Mucous membranes are moist.  Eyes:     Extraocular Movements: Extraocular movements intact.     Conjunctiva/sclera: Conjunctivae normal.     Pupils: Pupils are equal, round, and reactive to light.  Cardiovascular:     Rate and Rhythm: Normal rate and regular rhythm.     Pulses: Normal pulses.     Heart sounds: Normal heart sounds.  Pulmonary:     Effort: Pulmonary effort is normal.     Breath sounds: Normal breath sounds.   Abdominal:     General: Abdomen is flat. Bowel sounds are normal.     Palpations: Abdomen is soft.  Musculoskeletal:        General: Normal range of motion.     Cervical back: Normal range of motion.  Skin:    General: Skin is warm and dry.  Neurological:     General: No focal deficit present.     Mental Status: She is alert and oriented to person, place, and time.  Psychiatric:        Mood and Affect: Mood normal.        Behavior: Behavior normal.        Thought Content: Thought content normal.        Judgment: Judgment normal.      No results found for any visits on 05/30/23.  Recent Results (from the past 2160 hours)  POCT Urine Albumin/Creatinine with ratio [[id number]]     Status: Normal   Collection Time: 03/26/23  1:52 PM  Result Value Ref Range   Microalbumin Ur, POC 10 mg/L   Creatinine, POC 50 mg/dL   Albumin/Creatinine Ratio, Urine, POC <30   Lipid Profile     Status: Abnormal   Collection Time: 05/24/23 11:38 AM  Result Value Ref Range   Cholesterol, Total 140 100 - 199 mg/dL   Triglycerides 540 (H) 0 - 149 mg/dL   HDL 30 (L) >98 mg/dL   VLDL Cholesterol Cal 65 (H) 5 - 40 mg/dL   LDL Chol Calc (NIH) 45 0 - 99 mg/dL   Chol/HDL Ratio 4.7 (H) 0.0 - 4.4 ratio    Comment:                                   T. Chol/HDL Ratio  Men  Women                               1/2 Avg.Risk  3.4    3.3                                   Avg.Risk  5.0    4.4                                2X Avg.Risk  9.6    7.1                                3X Avg.Risk 23.4   11.0   CMP14+EGFR     Status: Abnormal   Collection Time: 05/24/23 11:38 AM  Result Value Ref Range   Glucose 138 (H) 70 - 99 mg/dL   BUN 17 8 - 27 mg/dL   Creatinine, Ser 4.09 (H) 0.57 - 1.00 mg/dL   eGFR 56 (L) >81 XB/JYN/8.29   BUN/Creatinine Ratio 15 12 - 28   Sodium 139 134 - 144 mmol/L   Potassium 3.8 3.5 - 5.2 mmol/L   Chloride 98 96 - 106 mmol/L   CO2 24 20 - 29  mmol/L   Calcium  9.2 8.7 - 10.3 mg/dL   Total Protein 6.2 6.0 - 8.5 g/dL   Albumin 3.8 (L) 3.9 - 4.9 g/dL   Globulin, Total 2.4 1.5 - 4.5 g/dL   Bilirubin Total 0.2 0.0 - 1.2 mg/dL   Alkaline Phosphatase 119 44 - 121 IU/L   AST 12 0 - 40 IU/L   ALT 18 0 - 32 IU/L  TSH     Status: Abnormal   Collection Time: 05/24/23 11:38 AM  Result Value Ref Range   TSH 10.200 (H) 0.450 - 4.500 uIU/mL  Hemoglobin A1c     Status: Abnormal   Collection Time: 05/24/23 11:38 AM  Result Value Ref Range   Hgb A1c MFr Bld 8.6 (H) 4.8 - 5.6 %    Comment:          Prediabetes: 5.7 - 6.4          Diabetes: >6.4          Glycemic control for adults with diabetes: <7.0    Est. average glucose Bld gHb Est-mCnc 200 mg/dL  CBC with Differential/Platelet     Status: Abnormal   Collection Time: 05/24/23 11:38 AM  Result Value Ref Range   WBC 13.5 (H) 3.4 - 10.8 x10E3/uL   RBC 4.73 3.77 - 5.28 x10E6/uL   Hemoglobin 14.1 11.1 - 15.9 g/dL   Hematocrit 56.2 13.0 - 46.6 %   MCV 89 79 - 97 fL   MCH 29.8 26.6 - 33.0 pg   MCHC 33.3 31.5 - 35.7 g/dL   RDW 86.5 (H) 78.4 - 69.6 %   Platelets 316 150 - 450 x10E3/uL   Neutrophils 64 Not Estab. %   Lymphs 29 Not Estab. %   Monocytes 4 Not Estab. %   Eos 2 Not Estab. %   Basos 0 Not Estab. %   Neutrophils Absolute 8.5 (H) 1.4 - 7.0 x10E3/uL   Lymphocytes Absolute 3.9 (H) 0.7 - 3.1 x10E3/uL   Monocytes Absolute 0.6 0.1 - 0.9 x10E3/uL  EOS (ABSOLUTE) 0.3 0.0 - 0.4 x10E3/uL   Basophils Absolute 0.1 0.0 - 0.2 x10E3/uL   Immature Granulocytes 1 Not Estab. %   Immature Grans (Abs) 0.2 (H) 0.0 - 0.1 x10E3/uL    Comment: (An elevated percentage of Immature Granulocytes has not been found to be clinically significant as a sole clinical predictor of disease. Does NOT include bands or blast cells.  Pregnancy associated physiological leukocytosis may also show increased immature granulocytes without clinical significance.)       Assessment & Plan:  Increase  levothyroxine  to 125 mcg daily Repeat TSH 4 weeks after increasing levothyroxine  Increase Lantus  to 22 units at bedtime Referral for colonoscopy sent Diflucan  for yeast infection Patient is still using her oxygen and benefiting from it   Problem List Items Addressed This Visit       Cardiovascular and Mediastinum   Type 2 diabetes mellitus with diabetic peripheral angiopathy without gangrene (HCC) - Primary   Relevant Medications   insulin  glargine (LANTUS  SOLOSTAR) 100 UNIT/ML Solostar Pen     Endocrine   Hypothyroidism (Chronic)   Relevant Medications   levothyroxine  (SYNTHROID ) 125 MCG tablet   Other Relevant Orders   TSH     Other   Hyperlipidemia   Other Visit Diagnoses       Colon cancer screening       Relevant Orders   Ambulatory referral to Gastroenterology       Return in about 4 months (around 09/30/2023) for fasting labs prior.   Total time spent: 25 minutes  [employer], NP  05/30/2023   This document may have been prepared by Dragon Voice Recognition software and as such may include unintentional dictation errors.

## 2023-06-06 ENCOUNTER — Other Ambulatory Visit: Payer: Self-pay | Admitting: Cardiology

## 2023-06-11 ENCOUNTER — Telehealth: Payer: Self-pay

## 2023-06-11 NOTE — Telephone Encounter (Signed)
 Mindy with [employer] called stating that she needs office notes stating that the patient is still using her oxygen and benefiting from it, can you amend the last note and LMK so I can print it and fax it back

## 2023-06-21 ENCOUNTER — Other Ambulatory Visit: Payer: Self-pay | Admitting: Cardiology

## 2023-06-23 ENCOUNTER — Other Ambulatory Visit: Payer: Self-pay | Admitting: Cardiovascular Disease

## 2023-07-06 ENCOUNTER — Other Ambulatory Visit: Payer: Self-pay | Admitting: Cardiology

## 2023-07-17 ENCOUNTER — Other Ambulatory Visit: Payer: Self-pay | Admitting: Cardiology

## 2023-07-17 ENCOUNTER — Other Ambulatory Visit: Payer: Self-pay | Admitting: Family

## 2023-07-25 ENCOUNTER — Other Ambulatory Visit: Payer: Self-pay | Admitting: Cardiovascular Disease

## 2023-08-08 ENCOUNTER — Other Ambulatory Visit: Payer: Self-pay | Admitting: Cardiology

## 2023-08-26 ENCOUNTER — Other Ambulatory Visit: Payer: Self-pay | Admitting: Family

## 2023-09-05 ENCOUNTER — Other Ambulatory Visit: Payer: Self-pay | Admitting: Cardiology

## 2023-09-16 ENCOUNTER — Other Ambulatory Visit: Payer: Self-pay | Admitting: Cardiovascular Disease

## 2023-09-23 ENCOUNTER — Other Ambulatory Visit: Payer: Self-pay | Admitting: Internal Medicine

## 2023-09-23 ENCOUNTER — Other Ambulatory Visit: Payer: Self-pay | Admitting: Cardiology

## 2023-09-30 ENCOUNTER — Other Ambulatory Visit (INDEPENDENT_AMBULATORY_CARE_PROVIDER_SITE_OTHER)

## 2023-09-30 ENCOUNTER — Other Ambulatory Visit: Payer: Self-pay

## 2023-09-30 DIAGNOSIS — E782 Mixed hyperlipidemia: Secondary | ICD-10-CM

## 2023-09-30 DIAGNOSIS — E039 Hypothyroidism, unspecified: Secondary | ICD-10-CM

## 2023-09-30 DIAGNOSIS — E1151 Type 2 diabetes mellitus with diabetic peripheral angiopathy without gangrene: Secondary | ICD-10-CM

## 2023-09-30 DIAGNOSIS — J449 Chronic obstructive pulmonary disease, unspecified: Secondary | ICD-10-CM

## 2023-09-30 MED ORDER — MONTELUKAST SODIUM 10 MG PO TABS: 10.0000 mg | ORAL_TABLET | Freq: Every day | ORAL | 2 refills | Status: AC

## 2023-09-30 MED ORDER — FEXOFENADINE HCL 180 MG PO TABS: 180.0000 mg | ORAL_TABLET | Freq: Every morning | ORAL | 3 refills | Status: AC

## 2023-10-01 ENCOUNTER — Ambulatory Visit: Payer: Self-pay | Admitting: Cardiology

## 2023-10-01 ENCOUNTER — Other Ambulatory Visit: Payer: Self-pay | Admitting: Cardiology

## 2023-10-01 LAB — CBC WITH DIFFERENTIAL/PLATELET
Basophils Absolute: 0 x10E3/uL (ref 0.0–0.2)
Basos: 0 %
EOS (ABSOLUTE): 0.3 x10E3/uL (ref 0.0–0.4)
Eos: 3 %
Hematocrit: 43.1 % (ref 34.0–46.6)
Hemoglobin: 13.8 g/dL (ref 11.1–15.9)
Immature Grans (Abs): 0.1 x10E3/uL (ref 0.0–0.1)
Immature Granulocytes: 1 %
Lymphocytes Absolute: 3.3 x10E3/uL — ABNORMAL HIGH (ref 0.7–3.1)
Lymphs: 30 %
MCH: 29 pg (ref 26.6–33.0)
MCHC: 32 g/dL (ref 31.5–35.7)
MCV: 91 fL (ref 79–97)
Monocytes Absolute: 0.6 x10E3/uL (ref 0.1–0.9)
Monocytes: 5 %
Neutrophils Absolute: 6.9 x10E3/uL (ref 1.4–7.0)
Neutrophils: 61 %
Platelets: 339 x10E3/uL (ref 150–450)
RBC: 4.76 x10E6/uL (ref 3.77–5.28)
RDW: 15.3 % (ref 11.7–15.4)
WBC: 11.2 x10E3/uL — ABNORMAL HIGH (ref 3.4–10.8)

## 2023-10-01 LAB — LIPID PANEL
Chol/HDL Ratio: 5.3 ratio — ABNORMAL HIGH (ref 0.0–4.4)
Cholesterol, Total: 121 mg/dL (ref 100–199)
HDL: 23 mg/dL — ABNORMAL LOW (ref >39)
LDL Chol Calc (NIH): 40 mg/dL (ref 0–99)
Triglycerides: 392 mg/dL — ABNORMAL HIGH (ref 0–149)
VLDL Cholesterol Cal: 58 mg/dL — ABNORMAL HIGH (ref 5–40)

## 2023-10-01 LAB — CMP14+EGFR
ALT: 13 IU/L (ref 0–32)
AST: 15 IU/L (ref 0–40)
Albumin: 4.1 g/dL (ref 3.9–4.9)
Alkaline Phosphatase: 138 IU/L — ABNORMAL HIGH (ref 49–135)
BUN/Creatinine Ratio: 10 — ABNORMAL LOW (ref 12–28)
BUN: 12 mg/dL (ref 8–27)
Bilirubin Total: 0.3 mg/dL (ref 0.0–1.2)
CO2: 24 mmol/L (ref 20–29)
Calcium: 9.4 mg/dL (ref 8.7–10.3)
Chloride: 96 mmol/L (ref 96–106)
Creatinine, Ser: 1.15 mg/dL — ABNORMAL HIGH (ref 0.57–1.00)
Globulin, Total: 2.8 g/dL (ref 1.5–4.5)
Glucose: 190 mg/dL — ABNORMAL HIGH (ref 70–99)
Potassium: 3.2 mmol/L — ABNORMAL LOW (ref 3.5–5.2)
Sodium: 140 mmol/L (ref 134–144)
Total Protein: 6.9 g/dL (ref 6.0–8.5)
eGFR: 54 mL/min/1.73 — ABNORMAL LOW (ref >59)

## 2023-10-01 LAB — HEMOGLOBIN A1C
Est. average glucose Bld gHb Est-mCnc: 169 mg/dL
Hgb A1c MFr Bld: 7.5 % — ABNORMAL HIGH (ref 4.8–5.6)

## 2023-10-01 LAB — TSH: TSH: 1.94 u[IU]/mL (ref 0.450–4.500)

## 2023-10-01 MED ORDER — POTASSIUM CHLORIDE ER 10 MEQ PO TBCR: 10.0000 meq | EXTENDED_RELEASE_TABLET | Freq: Every day | ORAL | 4 refills | Status: AC

## 2023-10-02 NOTE — Progress Notes (Signed)
Pt informed

## 2023-10-03 ENCOUNTER — Encounter: Payer: Self-pay | Admitting: Cardiology

## 2023-10-03 ENCOUNTER — Ambulatory Visit: Admitting: Cardiology

## 2023-10-03 VITALS — BP 158/68 | HR 78 | Ht 63.0 in | Wt 157.0 lb

## 2023-10-03 DIAGNOSIS — Z794 Long term (current) use of insulin: Secondary | ICD-10-CM

## 2023-10-03 DIAGNOSIS — J449 Chronic obstructive pulmonary disease, unspecified: Secondary | ICD-10-CM

## 2023-10-03 DIAGNOSIS — F17208 Nicotine dependence, unspecified, with other nicotine-induced disorders: Secondary | ICD-10-CM

## 2023-10-03 DIAGNOSIS — E782 Mixed hyperlipidemia: Secondary | ICD-10-CM | POA: Diagnosis not present

## 2023-10-03 DIAGNOSIS — Z0130 Encounter for examination of blood pressure without abnormal findings: Secondary | ICD-10-CM

## 2023-10-03 DIAGNOSIS — E1151 Type 2 diabetes mellitus with diabetic peripheral angiopathy without gangrene: Secondary | ICD-10-CM

## 2023-10-03 DIAGNOSIS — Z1231 Encounter for screening mammogram for malignant neoplasm of breast: Secondary | ICD-10-CM

## 2023-10-03 DIAGNOSIS — Z9189 Other specified personal risk factors, not elsewhere classified: Secondary | ICD-10-CM

## 2023-10-03 DIAGNOSIS — Z1211 Encounter for screening for malignant neoplasm of colon: Secondary | ICD-10-CM

## 2023-10-03 MED ORDER — AMOXICILLIN-POT CLAVULANATE 875-125 MG PO TABS
1.0000 | ORAL_TABLET | Freq: Two times a day (BID) | ORAL | 0 refills | Status: AC
Start: 2023-10-03 — End: 2023-10-10

## 2023-10-03 MED ORDER — LANTUS SOLOSTAR 100 UNIT/ML ~~LOC~~ SOPN: 23.0000 [IU] | PEN_INJECTOR | Freq: Every day | SUBCUTANEOUS | 6 refills | Status: AC

## 2023-10-03 MED ORDER — IPRATROPIUM BROMIDE 0.03 % NA SOLN: 2.0000 | Freq: Three times a day (TID) | NASAL | 2 refills | Status: AC

## 2023-10-03 NOTE — Progress Notes (Signed)
 Established Patient Office Visit  Subjective:  Patient ID: Shannon Russell, female    DOB: 19-Aug-1960  Age: 63 y.o. MRN: [id number]  Chief Complaint  Patient presents with   Follow-up    4 months follow up    Patient in office for 4 month follow up, discuss recent lab results. Patient doing well, no new complaints today.  Discussed recent lab work, Hgb A1c improved. LDL at goal. WBC elevated, current sinus infection. Will send in Augmentin , nasal spray. Patient due for colonoscopy, referral sent. Due for dexa scan, order placed.  Due for pap smear, will schedule.  Due for mammogram, order placed.  Due for low dose CT lung cancer screening, order placed.     No other concerns at this time.   Past Medical History:  Diagnosis Date   Arthritis    Asthma    Cancer (HCC)    COPD (chronic obstructive pulmonary disease) (HCC)    Depression    Diabetes mellitus without complication (HCC)    Emphysema lung (HCC)    H/O blood clots    Obesity, Class I, BMI 30-34.9 07/27/2021   PAD (peripheral artery disease)    Peripheral artery disease    Thyroid disease     Past Surgical History:  Procedure Laterality Date   Bypass right leg  2015   CHOLECYSTECTOMY  1981   EXPLORATORY LAPAROTOMY  1978   LOWER EXTREMITY ANGIOGRAPHY Right 02/20/2022   Procedure: Lower Extremity Angiography;  Surgeon: Jama Cordella MATSU, MD;  Location: [facility];  Service: Cardiovascular;  Laterality: Right;   LOWER EXTREMITY ANGIOGRAPHY Right 02/05/2023   Procedure: Lower Extremity Angiography;  Surgeon: Jama Cordella MATSU, MD;  Location: [facility];  Service: Cardiovascular;  Laterality: Right;   Mesh implant right leg  2015   THYROIDECTOMY  2013   TUBAL LIGATION  1993    Social History   Socioeconomic History   Marital status: Legally Separated    Spouse name: Not on file   Number of children: Not on file   Years of education: Not on file   Highest education level: Not on file   Occupational History   Not on file  Tobacco Use   Smoking status: Every Day    Current packs/day: 0.50    Average packs/day: 0.5 packs/day for 45.0 years (22.5 ttl pk-yrs)    Types: Cigarettes   Smokeless tobacco: Never   Tobacco comments:    6 ciggs a day.  Vaping Use   Vaping status: Never Used  Substance and Sexual Activity   Alcohol use: Not Currently   Drug use: Not Currently   Sexual activity: Not on file  Other Topics Concern   Not on file  Social History Narrative   Not on file   Social Drivers of Health   Financial Resource Strain: Not on file  Food Insecurity: Not on file  Transportation Needs: Not on file  Physical Activity: Not on file  Stress: Not on file  Social Connections: Not on file  Intimate Partner Violence: Not on file    Family History  Problem Relation Age of Onset   Heart failure Mother    Depression Mother    Peripheral Artery Disease Sister    Cancer Sister    Clotting disorder Sister    Depression Sister    Thyroid disease Sister     Allergies  Allergen Reactions   Iobenguane I 131 Hives   Advair Diskus [Fluticasone -Salmeterol] Hives   Iodinated Contrast  Media Hives   Iodine  Hives and Rash    Other reaction(s): Skin Rashes, Hives  Pt states she need premedication for contrast exposure    Outpatient Medications Prior to Visit  Medication Sig   Accu-Chek Softclix Lancets lancets Use as instructed   albuterol  (VENTOLIN  HFA) 108 (90 Base) MCG/ACT inhaler Inhale 2 puffs into the lungs every 6 (six) hours as needed for wheezing.   aspirin  EC 81 MG tablet Take 81 mg by mouth daily. Swallow whole.   atorvastatin  (LIPITOR) 80 MG tablet Take 1 tablet (80 mg total) by mouth daily.   baclofen  (LIORESAL ) 10 MG tablet Take 1 tablet (10 mg total) by mouth every 12 (twelve) hours as needed for muscle spasms.   BD PEN NEEDLE MINI ULTRAFINE 31G X 5 MM MISC USE AS DIRECTED   budesonide  (PULMICORT ) 1 MG/2ML nebulizer solution INHALE 2 ML BY  NEBULIZATION TWICE DAILY   Cholecalciferol  1.25 MG (50000 UT) capsule Vitamin D3 1.25 MG (50000 UT) Oral Capsule QTY: 0 capsule Days: 0 Refills: 0  Written: 02/09/20 Patient Instructions: qaw   cloNIDine  (CATAPRES ) 0.1 MG tablet Take 1 tablet (0.1 mg total) by mouth at bedtime.   clopidogrel  (PLAVIX ) 75 MG tablet Take 1 tablet by mouth once daily   fexofenadine  (ALLEGRA ) 180 MG tablet Take 1 tablet (180 mg total) by mouth every morning.   fluconazole  (DIFLUCAN ) 150 MG tablet Take one tablet every 72 hours   furosemide  (LASIX ) 40 MG tablet Take 1 tablet by mouth once daily   glucose blood test strip Use as instructed   ipratropium-albuterol  (DUONEB) 0.5-2.5 (3) MG/3ML SOLN Take 3 mLs by nebulization every 6 (six) hours as needed.   levothyroxine  (SYNTHROID ) 125 MCG tablet Take 1 tablet (125 mcg total) by mouth daily.   metFORMIN  (GLUCOPHAGE ) 1000 MG tablet Take 1 tablet (1,000 mg total) by mouth every morning.   methocarbamol (ROBAXIN) 500 MG tablet Take 1 tablet by mouth every 8 (eight) hours.   montelukast  (SINGULAIR ) 10 MG tablet Take 1 tablet (10 mg total) by mouth at bedtime.   oxyCODONE -acetaminophen  (PERCOCET) 10-325 MG tablet Take 1 tablet by mouth 4 (four) times daily as needed.   pantoprazole  (PROTONIX ) 40 MG tablet Take 1 tablet by mouth twice daily   potassium chloride  (KLOR-CON  10) 10 MEQ tablet Take 1 tablet (10 mEq total) by mouth daily.   pregabalin  (LYRICA ) 75 MG capsule TAKE 1 CAPSULE BY MOUTH THREE TIMES DAILY   SPIRIVA  HANDIHALER 18 MCG inhalation capsule Inhale 1 puff by mouth once daily   temazepam (RESTORIL) 15 MG capsule Take 15 mg by mouth at bedtime as needed.   TRELEGY ELLIPTA  100-62.5-25 MCG/ACT AEPB Inhale 1 puff into the lungs daily.   triamcinolone  ointment (KENALOG ) 0.5 % Apply 1 Application topically 2 (two) times daily.   [DISCONTINUED] LANTUS  SOLOSTAR 100 UNIT/ML Solostar Pen INJECT 18 UNITS SUBCUTANEOUSLY AT BEDTIME   No facility-administered medications  prior to visit.    Review of Systems  Constitutional: Negative.   HENT: Negative.    Eyes: Negative.   Respiratory: Negative.  Negative for shortness of breath.   Cardiovascular: Negative.  Negative for chest pain.  Gastrointestinal: Negative.  Negative for abdominal pain, constipation and diarrhea.  Genitourinary: Negative.   Musculoskeletal:  Negative for joint pain and myalgias.  Skin: Negative.   Neurological: Negative.  Negative for dizziness and headaches.  Endo/Heme/Allergies: Negative.   All other systems reviewed and are negative.      Objective:   BP (!) 158/68  Pulse 78   Ht 5' 3 (1.6 m)   Wt 157 lb (71.2 kg)   SpO2 96%   BMI 27.81 kg/m   Vitals:   10/03/23 1319  BP: (!) 158/68  Pulse: 78  Height: 5' 3 (1.6 m)  Weight: 157 lb (71.2 kg)  SpO2: 96%  BMI (Calculated): 27.82    Physical Exam Vitals and nursing note reviewed.  Constitutional:      Appearance: Normal appearance. She is normal weight.  HENT:     Head: Normocephalic and atraumatic.     Nose: Nose normal.     Mouth/Throat:     Mouth: Mucous membranes are moist.  Eyes:     Extraocular Movements: Extraocular movements intact.     Conjunctiva/sclera: Conjunctivae normal.     Pupils: Pupils are equal, round, and reactive to light.  Cardiovascular:     Rate and Rhythm: Normal rate and regular rhythm.     Pulses: Normal pulses.     Heart sounds: Normal heart sounds.  Pulmonary:     Effort: Pulmonary effort is normal.     Breath sounds: Normal breath sounds.  Abdominal:     General: Abdomen is flat. Bowel sounds are normal.     Palpations: Abdomen is soft.  Musculoskeletal:        General: Normal range of motion.     Cervical back: Normal range of motion.  Skin:    General: Skin is warm and dry.  Neurological:     General: No focal deficit present.     Mental Status: She is alert and oriented to person, place, and time.  Psychiatric:        Mood and Affect: Mood normal.         Behavior: Behavior normal.        Thought Content: Thought content normal.        Judgment: Judgment normal.      No results found for any visits on 10/03/23.  Recent Results (from the past 2160 hours)  CBC with Differential/Platelet     Status: Abnormal   Collection Time: 09/30/23 11:01 AM  Result Value Ref Range   WBC 11.2 (H) 3.4 - 10.8 x10E3/uL   RBC 4.76 3.77 - 5.28 x10E6/uL   Hemoglobin 13.8 11.1 - 15.9 g/dL   Hematocrit 56.8 65.9 - 46.6 %   MCV 91 79 - 97 fL   MCH 29.0 26.6 - 33.0 pg   MCHC 32.0 31.5 - 35.7 g/dL   RDW 84.6 88.2 - 84.5 %   Platelets 339 150 - 450 x10E3/uL   Neutrophils 61 Not Estab. %   Lymphs 30 Not Estab. %   Monocytes 5 Not Estab. %   Eos 3 Not Estab. %   Basos 0 Not Estab. %   Neutrophils Absolute 6.9 1.4 - 7.0 x10E3/uL   Lymphocytes Absolute 3.3 (H) 0.7 - 3.1 x10E3/uL   Monocytes Absolute 0.6 0.1 - 0.9 x10E3/uL   EOS (ABSOLUTE) 0.3 0.0 - 0.4 x10E3/uL   Basophils Absolute 0.0 0.0 - 0.2 x10E3/uL   Immature Granulocytes 1 Not Estab. %   Immature Grans (Abs) 0.1 0.0 - 0.1 x10E3/uL  Hemoglobin A1c     Status: Abnormal   Collection Time: 09/30/23 11:01 AM  Result Value Ref Range   Hgb A1c MFr Bld 7.5 (H) 4.8 - 5.6 %    Comment:          Prediabetes: 5.7 - 6.4          Diabetes: >  6.4          Glycemic control for adults with diabetes: <7.0    Est. average glucose Bld gHb Est-mCnc 169 mg/dL  TSH     Status: None   Collection Time: 09/30/23 11:01 AM  Result Value Ref Range   TSH 1.940 0.450 - 4.500 uIU/mL  CMP14+EGFR     Status: Abnormal   Collection Time: 09/30/23 11:01 AM  Result Value Ref Range   Glucose 190 (H) 70 - 99 mg/dL   BUN 12 8 - 27 mg/dL   Creatinine, Ser 8.84 (H) 0.57 - 1.00 mg/dL   eGFR 54 (L) >40 fO/fpw/8.26   BUN/Creatinine Ratio 10 (L) 12 - 28   Sodium 140 134 - 144 mmol/L   Potassium 3.2 (L) 3.5 - 5.2 mmol/L   Chloride 96 96 - 106 mmol/L   CO2 24 20 - 29 mmol/L   Calcium  9.4 8.7 - 10.3 mg/dL   Total Protein 6.9 6.0 -  8.5 g/dL   Albumin 4.1 3.9 - 4.9 g/dL   Globulin, Total 2.8 1.5 - 4.5 g/dL   Bilirubin Total 0.3 0.0 - 1.2 mg/dL   Alkaline Phosphatase 138 (H) 49 - 135 IU/L    Comment:               **Please note reference interval change**   AST 15 0 - 40 IU/L   ALT 13 0 - 32 IU/L  Lipid Profile     Status: Abnormal   Collection Time: 09/30/23 11:01 AM  Result Value Ref Range   Cholesterol, Total 121 100 - 199 mg/dL   Triglycerides 607 (H) 0 - 149 mg/dL   HDL 23 (L) >60 mg/dL   VLDL Cholesterol Cal 58 (H) 5 - 40 mg/dL   LDL Chol Calc (NIH) 40 0 - 99 mg/dL   Chol/HDL Ratio 5.3 (H) 0.0 - 4.4 ratio    Comment:                                   T. Chol/HDL Ratio                                             Men  Women                               1/2 Avg.Risk  3.4    3.3                                   Avg.Risk  5.0    4.4                                2X Avg.Risk  9.6    7.1                                3X Avg.Risk 23.4   11.0       Assessment & Plan:  Augmentin  Nasal spray Colonoscopy referral sent Dexa scan ordered Pap smear scheduled Mammogram ordered CT lung cancer screening ordered  Problem List Items Addressed  This Visit       Cardiovascular and Mediastinum   Type 2 diabetes mellitus with diabetic peripheral angiopathy without gangrene (HCC)   Relevant Medications   insulin  glargine (LANTUS  SOLOSTAR) 100 UNIT/ML Solostar Pen     Respiratory   COPD (chronic obstructive pulmonary disease) (HCC)   Relevant Medications   ipratropium (ATROVENT ) 0.03 % nasal spray     Other   Nicotine  dependence   Relevant Orders   CT CHEST LUNG CA SCREEN LOW DOSE W/O CM   Hyperlipidemia   Other Visit Diagnoses       Colon cancer screening    -  Primary   Relevant Orders   Ambulatory referral to Gastroenterology     Breast cancer screening by mammogram       Relevant Orders   MM 3D SCREENING MAMMOGRAM BILATERAL BREAST     At risk for bone density loss       Relevant Orders   DG  Bone Density       Return in about 4 weeks (around 10/31/2023) for  with amanda for pap smear.   Total time spent: 25 minutes  [employer], NP  10/03/2023   This document may have been prepared by Dragon Voice Recognition software and as such may include unintentional dictation errors.

## 2023-10-03 NOTE — Patient Instructions (Signed)
 [facility] [address], [address] [place],  [place]  [address]  Main: [phone]

## 2023-10-04 ENCOUNTER — Ambulatory Visit: Admitting: Cardiology

## 2023-10-22 ENCOUNTER — Other Ambulatory Visit: Payer: Self-pay | Admitting: Cardiology

## 2023-10-23 ENCOUNTER — Other Ambulatory Visit: Payer: Self-pay | Admitting: Cardiovascular Disease

## 2023-10-31 ENCOUNTER — Telehealth (INDEPENDENT_AMBULATORY_CARE_PROVIDER_SITE_OTHER): Payer: Self-pay | Admitting: Vascular Surgery

## 2023-10-31 ENCOUNTER — Other Ambulatory Visit: Payer: Self-pay | Admitting: Family

## 2023-10-31 ENCOUNTER — Ambulatory Visit: Admitting: Cardiovascular Disease

## 2023-10-31 NOTE — Telephone Encounter (Signed)
 Patient called stating that she needs a form completed by our office in order for her to have a dental procedure (possibly next week). Patient states she needs clearance to come off her Plavix  for the procedure. LVM for [facility] (at [phone]) to please fax form to [facility] or call our office if that have any questions. Patient states that contact name at [facility] is: Lupi.   Patient states that she dropped off paperwork last week but paperwork not found.

## 2023-11-01 ENCOUNTER — Ambulatory Visit: Admitting: Family

## 2023-11-01 ENCOUNTER — Encounter: Payer: Self-pay | Admitting: Family

## 2023-11-01 VITALS — BP 138/86 | HR 51 | Ht 63.0 in | Wt 156.0 lb

## 2023-11-01 DIAGNOSIS — Z124 Encounter for screening for malignant neoplasm of cervix: Secondary | ICD-10-CM

## 2023-11-01 DIAGNOSIS — Z131 Encounter for screening for diabetes mellitus: Secondary | ICD-10-CM

## 2023-11-01 DIAGNOSIS — Z0000 Encounter for general adult medical examination without abnormal findings: Secondary | ICD-10-CM

## 2023-11-01 DIAGNOSIS — Z1151 Encounter for screening for human papillomavirus (HPV): Secondary | ICD-10-CM

## 2023-11-01 DIAGNOSIS — Z0130 Encounter for examination of blood pressure without abnormal findings: Secondary | ICD-10-CM

## 2023-11-01 DIAGNOSIS — Z1272 Encounter for screening for malignant neoplasm of vagina: Secondary | ICD-10-CM | POA: Diagnosis not present

## 2023-11-01 NOTE — Telephone Encounter (Signed)
 Patient called in reference to medical clearance for her dental procedure next week, patient stated the dentist office had not received the fax at this time and was upset because she needs to be off of her medication for a period of time before she can have the procedure.

## 2023-11-02 ENCOUNTER — Encounter: Payer: Self-pay | Admitting: Family

## 2023-11-02 NOTE — Progress Notes (Signed)
 Complete physical exam  Patient: Shannon Russell   DOB: 10-26-1960   63 y.o. Female  MRN: [id number]  Subjective:    Chief Complaint  Patient presents with   Annual Exam    CPE, PAP    Shannon Russell is a 63 y.o. female who presents today for a complete physical exam. She reports consuming a general diet.  She generally feels fairly well. She reports sleeping fairly well. She does not have additional problems to discuss today.  Past Medical History:  Diagnosis Date   Arthritis    Asthma    Cancer (HCC)    COPD (chronic obstructive pulmonary disease) (HCC)    Depression    Diabetes mellitus without complication (HCC)    Emphysema lung (HCC)    H/O blood clots    Obesity, Class I, BMI 30-34.9 07/27/2021   PAD (peripheral artery disease)    Peripheral artery disease    Thyroid disease     Past Surgical History:  Procedure Laterality Date   Bypass right leg  2015   CHOLECYSTECTOMY  1981   EXPLORATORY LAPAROTOMY  1978   LOWER EXTREMITY ANGIOGRAPHY Right 02/20/2022   Procedure: Lower Extremity Angiography;  Surgeon: Jama Cordella MATSU, MD;  Location: [facility];  Service: Cardiovascular;  Laterality: Right;   LOWER EXTREMITY ANGIOGRAPHY Right 02/05/2023   Procedure: Lower Extremity Angiography;  Surgeon: Jama Cordella MATSU, MD;  Location: [facility];  Service: Cardiovascular;  Laterality: Right;   Mesh implant right leg  2015   THYROIDECTOMY  2013   TUBAL LIGATION  1993    Family History  Problem Relation Age of Onset   Heart failure Mother    Depression Mother    Peripheral Artery Disease Sister    Cancer Sister    Clotting disorder Sister    Depression Sister    Thyroid disease Sister     Social History   Socioeconomic History   Marital status: Legally Separated    Spouse name: Not on file   Number of children: Not on file   Years of education: Not on file   Highest education level: Not on file  Occupational History   Not on file  Tobacco  Use   Smoking status: Every Day    Current packs/day: 0.50    Average packs/day: 0.5 packs/day for 45.0 years (22.5 ttl pk-yrs)    Types: Cigarettes   Smokeless tobacco: Never   Tobacco comments:    6 ciggs a day.  Vaping Use   Vaping status: Never Used  Substance and Sexual Activity   Alcohol use: Not Currently   Drug use: Not Currently   Sexual activity: Not on file  Other Topics Concern   Not on file  Social History Narrative   Not on file   Social Drivers of Health   Financial Resource Strain: Not on file  Food Insecurity: Not on file  Transportation Needs: Not on file  Physical Activity: Not on file  Stress: Not on file  Social Connections: Not on file  Intimate Partner Violence: Not on file    Outpatient Medications Prior to Visit  Medication Sig   Accu-Chek Softclix Lancets lancets Use as instructed   albuterol  (VENTOLIN  HFA) 108 (90 Base) MCG/ACT inhaler Inhale 2 puffs into the lungs every 6 (six) hours as needed for wheezing.   aspirin  EC 81 MG tablet Take 81 mg by mouth daily. Swallow whole.   atorvastatin  (LIPITOR) 80 MG tablet Take 1 tablet (80 mg  total) by mouth daily.   baclofen  (LIORESAL ) 10 MG tablet Take 1 tablet (10 mg total) by mouth every 12 (twelve) hours as needed for muscle spasms.   BD PEN NEEDLE MINI ULTRAFINE 31G X 5 MM MISC USE AS DIRECTED   budesonide  (PULMICORT ) 1 MG/2ML nebulizer solution INHALE 2 ML BY NEBULIZATION TWICE DAILY   Cholecalciferol  1.25 MG (50000 UT) capsule Vitamin D3 1.25 MG (50000 UT) Oral Capsule QTY: 0 capsule Days: 0 Refills: 0  Written: 02/09/20 Patient Instructions: qaw   cloNIDine  (CATAPRES ) 0.1 MG tablet Take 1 tablet (0.1 mg total) by mouth at bedtime.   clopidogrel  (PLAVIX ) 75 MG tablet Take 1 tablet by mouth once daily   fexofenadine  (ALLEGRA ) 180 MG tablet Take 1 tablet (180 mg total) by mouth every morning.   furosemide  (LASIX ) 40 MG tablet Take 1 tablet by mouth once daily   glucose blood test strip Use as  instructed   insulin  glargine (LANTUS  SOLOSTAR) 100 UNIT/ML Solostar Pen Inject 23 Units into the skin at bedtime.   ipratropium (ATROVENT ) 0.03 % nasal spray Place 2 sprays into the nose 3 (three) times daily.   ipratropium-albuterol  (DUONEB) 0.5-2.5 (3) MG/3ML SOLN Take 3 mLs by nebulization every 6 (six) hours as needed.   levothyroxine  (SYNTHROID ) 125 MCG tablet Take 1 tablet (125 mcg total) by mouth daily.   metFORMIN  (GLUCOPHAGE ) 1000 MG tablet Take 1 tablet (1,000 mg total) by mouth every morning.   methocarbamol (ROBAXIN) 500 MG tablet Take 1 tablet by mouth every 8 (eight) hours.   montelukast  (SINGULAIR ) 10 MG tablet Take 1 tablet (10 mg total) by mouth at bedtime.   oxyCODONE -acetaminophen  (PERCOCET) 10-325 MG tablet Take 1 tablet by mouth 4 (four) times daily as needed.   pantoprazole  (PROTONIX ) 40 MG tablet Take 1 tablet by mouth twice daily   potassium chloride  (KLOR-CON  10) 10 MEQ tablet Take 1 tablet (10 mEq total) by mouth daily.   pregabalin  (LYRICA ) 75 MG capsule TAKE 1 CAPSULE BY MOUTH THREE TIMES DAILY   SPIRIVA  HANDIHALER 18 MCG inhalation capsule Inhale 1 puff by mouth once daily   temazepam (RESTORIL) 15 MG capsule Take 15 mg by mouth at bedtime as needed.   TRELEGY ELLIPTA  100-62.5-25 MCG/ACT AEPB Inhale 1 puff into the lungs daily.   triamcinolone  ointment (KENALOG ) 0.5 % Apply 1 Application topically 2 (two) times daily.   fluconazole  (DIFLUCAN ) 150 MG tablet Take one tablet every 72 hours (Patient not taking: Reported on 11/01/2023)   No facility-administered medications prior to visit.    Review of Systems  All other systems reviewed and are negative.       Objective:     BP 138/86   Pulse (!) 51   Ht 5' 3 (1.6 m)   Wt 156 lb (70.8 kg)   SpO2 95%   BMI 27.63 kg/m    Physical Exam Vitals and nursing note reviewed.  Constitutional:      Appearance: Normal appearance. She is normal weight.  HENT:     Head: Normocephalic.  Eyes:     Extraocular  Movements: Extraocular movements intact.     Conjunctiva/sclera: Conjunctivae normal.     Pupils: Pupils are equal, round, and reactive to light.  Cardiovascular:     Rate and Rhythm: Normal rate.  Pulmonary:     Effort: Pulmonary effort is normal.  Neurological:     General: No focal deficit present.     Mental Status: She is alert and oriented to person, place, and time.  Mental status is at baseline.  Psychiatric:        Mood and Affect: Mood normal.        Behavior: Behavior normal.        Thought Content: Thought content normal.      No results found for any visits on 11/01/23.  Recent Results (from the past 2160 hours)  CBC with Differential/Platelet     Status: Abnormal   Collection Time: 09/30/23 11:01 AM  Result Value Ref Range   WBC 11.2 (H) 3.4 - 10.8 x10E3/uL   RBC 4.76 3.77 - 5.28 x10E6/uL   Hemoglobin 13.8 11.1 - 15.9 g/dL   Hematocrit 56.8 65.9 - 46.6 %   MCV 91 79 - 97 fL   MCH 29.0 26.6 - 33.0 pg   MCHC 32.0 31.5 - 35.7 g/dL   RDW 84.6 88.2 - 84.5 %   Platelets 339 150 - 450 x10E3/uL   Neutrophils 61 Not Estab. %   Lymphs 30 Not Estab. %   Monocytes 5 Not Estab. %   Eos 3 Not Estab. %   Basos 0 Not Estab. %   Neutrophils Absolute 6.9 1.4 - 7.0 x10E3/uL   Lymphocytes Absolute 3.3 (H) 0.7 - 3.1 x10E3/uL   Monocytes Absolute 0.6 0.1 - 0.9 x10E3/uL   EOS (ABSOLUTE) 0.3 0.0 - 0.4 x10E3/uL   Basophils Absolute 0.0 0.0 - 0.2 x10E3/uL   Immature Granulocytes 1 Not Estab. %   Immature Grans (Abs) 0.1 0.0 - 0.1 x10E3/uL  Hemoglobin A1c     Status: Abnormal   Collection Time: 09/30/23 11:01 AM  Result Value Ref Range   Hgb A1c MFr Bld 7.5 (H) 4.8 - 5.6 %    Comment:          Prediabetes: 5.7 - 6.4          Diabetes: >6.4          Glycemic control for adults with diabetes: <7.0    Est. average glucose Bld gHb Est-mCnc 169 mg/dL  TSH     Status: None   Collection Time: 09/30/23 11:01 AM  Result Value Ref Range   TSH 1.940 0.450 - 4.500 uIU/mL  CMP14+EGFR      Status: Abnormal   Collection Time: 09/30/23 11:01 AM  Result Value Ref Range   Glucose 190 (H) 70 - 99 mg/dL   BUN 12 8 - 27 mg/dL   Creatinine, Ser 8.84 (H) 0.57 - 1.00 mg/dL   eGFR 54 (L) >40 fO/fpw/8.26   BUN/Creatinine Ratio 10 (L) 12 - 28   Sodium 140 134 - 144 mmol/L   Potassium 3.2 (L) 3.5 - 5.2 mmol/L   Chloride 96 96 - 106 mmol/L   CO2 24 20 - 29 mmol/L   Calcium  9.4 8.7 - 10.3 mg/dL   Total Protein 6.9 6.0 - 8.5 g/dL   Albumin 4.1 3.9 - 4.9 g/dL   Globulin, Total 2.8 1.5 - 4.5 g/dL   Bilirubin Total 0.3 0.0 - 1.2 mg/dL   Alkaline Phosphatase 138 (H) 49 - 135 IU/L    Comment:               **Please note reference interval change**   AST 15 0 - 40 IU/L   ALT 13 0 - 32 IU/L  Lipid Profile     Status: Abnormal   Collection Time: 09/30/23 11:01 AM  Result Value Ref Range   Cholesterol, Total 121 100 - 199 mg/dL   Triglycerides 607 (H) 0 - 149 mg/dL  HDL 23 (L) >39 mg/dL   VLDL Cholesterol Cal 58 (H) 5 - 40 mg/dL   LDL Chol Calc (NIH) 40 0 - 99 mg/dL   Chol/HDL Ratio 5.3 (H) 0.0 - 4.4 ratio    Comment:                                   T. Chol/HDL Ratio                                             Men  Women                               1/2 Avg.Risk  3.4    3.3                                   Avg.Risk  5.0    4.4                                2X Avg.Risk  9.6    7.1                                3X Avg.Risk 23.4   11.0         Assessment & Plan:    Routine Health Maintenance and Physical Exam  Immunization History  Administered Date(s) Administered   Influenza,inj,Quad PF,6+ Mos 09/22/2012   Pneumococcal Polysaccharide-23 09/22/2012    Health Maintenance  Topic Date Due   FOOT EXAM  Never done   Hepatitis C Screening  Never done   DTaP/Tdap/Td (1 - Tdap) Never done   Cervical Cancer Screening (HPV/Pap Cotest)  Never done   Mammogram  Never done   Colonoscopy  Never done   Zoster Vaccines- Shingrix (1 of 2) Never done   Pneumococcal Vaccine: 50+  Years (2 of 2 - PCV) 09/22/2013   Lung Cancer Screening  05/31/2021   OPHTHALMOLOGY EXAM  08/28/2023   COVID-19 Vaccine (1 - 2025-26 season) Never done   Diabetic kidney evaluation - Urine ACR  03/25/2024   HEMOGLOBIN A1C  03/29/2024   Diabetic kidney evaluation - eGFR measurement  09/29/2024   HIV Screening  Completed   Hepatitis B Vaccines 19-59 Average Risk  Aged Out   HPV VACCINES  Aged Out   Meningococcal B Vaccine  Aged Out   Influenza Vaccine  Discontinued    Discussed health benefits of physical activity, and encouraged her to engage in regular exercise appropriate for her age and condition.  Problem List Items Addressed This Visit   None Visit Diagnoses       Vaginal Pap smear    -  Primary   Relevant Orders   IGP, Aptima HPV     Screening for human papillomavirus (HPV)       Relevant Orders   IGP, Aptima HPV     Screening for cervical cancer       Relevant Orders   IGP, Aptima HPV      No follow-ups on file.     Alise Calais CHRISTELLA ARRANT,  FNP  11/01/2023   This document may have been prepared by [facility] Voice Recognition software and as such may include unintentional dictation errors.

## 2023-11-04 ENCOUNTER — Ambulatory Visit: Admitting: Cardiovascular Disease

## 2023-11-05 ENCOUNTER — Other Ambulatory Visit: Payer: Self-pay | Admitting: Cardiology

## 2023-11-05 ENCOUNTER — Ambulatory Visit: Admitting: Cardiovascular Disease

## 2023-11-05 ENCOUNTER — Telehealth: Payer: Self-pay

## 2023-11-05 ENCOUNTER — Other Ambulatory Visit: Payer: Self-pay

## 2023-11-05 ENCOUNTER — Encounter: Payer: Self-pay | Admitting: Cardiovascular Disease

## 2023-11-05 VITALS — BP 122/74 | HR 88 | Ht 63.0 in | Wt 156.2 lb

## 2023-11-05 DIAGNOSIS — Z01810 Encounter for preprocedural cardiovascular examination: Secondary | ICD-10-CM

## 2023-11-05 DIAGNOSIS — I10 Essential (primary) hypertension: Secondary | ICD-10-CM | POA: Diagnosis not present

## 2023-11-05 DIAGNOSIS — Z1211 Encounter for screening for malignant neoplasm of colon: Secondary | ICD-10-CM

## 2023-11-05 DIAGNOSIS — I70219 Atherosclerosis of native arteries of extremities with intermittent claudication, unspecified extremity: Secondary | ICD-10-CM

## 2023-11-05 DIAGNOSIS — J449 Chronic obstructive pulmonary disease, unspecified: Secondary | ICD-10-CM

## 2023-11-05 DIAGNOSIS — E1151 Type 2 diabetes mellitus with diabetic peripheral angiopathy without gangrene: Secondary | ICD-10-CM | POA: Diagnosis not present

## 2023-11-05 DIAGNOSIS — E66811 Obesity, class 1: Secondary | ICD-10-CM

## 2023-11-05 DIAGNOSIS — Z794 Long term (current) use of insulin: Secondary | ICD-10-CM

## 2023-11-05 DIAGNOSIS — K219 Gastro-esophageal reflux disease without esophagitis: Secondary | ICD-10-CM

## 2023-11-05 DIAGNOSIS — E782 Mixed hyperlipidemia: Secondary | ICD-10-CM

## 2023-11-05 DIAGNOSIS — I70221 Atherosclerosis of native arteries of extremities with rest pain, right leg: Secondary | ICD-10-CM

## 2023-11-05 DIAGNOSIS — I739 Peripheral vascular disease, unspecified: Secondary | ICD-10-CM

## 2023-11-05 LAB — IGP, APTIMA HPV
HPV Aptima: NEGATIVE
PAP Smear Comment: 0

## 2023-11-05 MED ORDER — METHYLPREDNISOLONE 4 MG PO TBPK: ORAL_TABLET | ORAL | 0 refills | Status: AC

## 2023-11-05 MED ORDER — FUROSEMIDE 40 MG PO TABS: 40.0000 mg | ORAL_TABLET | Freq: Every day | ORAL | 0 refills | Status: AC

## 2023-11-05 MED ORDER — TRIAMCINOLONE ACETONIDE 0.5 % EX OINT: 1.0000 | TOPICAL_OINTMENT | Freq: Two times a day (BID) | CUTANEOUS | 0 refills | Status: DC

## 2023-11-05 MED ORDER — ALCOHOL PREP 70 % PADS: MEDICATED_PAD | 2 refills | Status: AC

## 2023-11-05 MED ORDER — LEVOFLOXACIN 250 MG PO TABS: 250.0000 mg | ORAL_TABLET | Freq: Every day | ORAL | 0 refills | Status: AC

## 2023-11-05 NOTE — Telephone Encounter (Signed)
 Patient was in office today for clearance for [facility]oscopy with Dr Denyse, she informed him that she does not want to see [facility] she wants to be referred to [facility] instead

## 2023-11-05 NOTE — Progress Notes (Signed)
 Cardiology Office Note   Date:  11/05/2023   ID:  Shannon Russell, DOB 1960/05/17, MRN [id number]  PCP:  Carin Gauze, NP  Cardiologist:  Denyse Bathe, MD      History of Present Illness: Shannon Russell is a 63 y.o. female who presents for  Chief Complaint  Patient presents with   Follow-up    Med refill    47 YOWF, smoker came for evaluation as needs colonoscopy, and needs clearance. She  is taking plavix  as needs to be held for procedure.      Past Medical History:  Diagnosis Date   Arthritis    Asthma    Cancer (HCC)    COPD (chronic obstructive pulmonary disease) (HCC)    Depression    Diabetes mellitus without complication (HCC)    Emphysema lung (HCC)    H/O blood clots    Obesity, Class I, BMI 30-34.9 07/27/2021   PAD (peripheral artery disease)    Peripheral artery disease    Thyroid disease      Past Surgical History:  Procedure Laterality Date   Bypass right leg  2015   CHOLECYSTECTOMY  1981   EXPLORATORY LAPAROTOMY  1978   LOWER EXTREMITY ANGIOGRAPHY Right 02/20/2022   Procedure: Lower Extremity Angiography;  Surgeon: Jama Cordella MATSU, MD;  Location: [facility];  Service: Cardiovascular;  Laterality: Right;   LOWER EXTREMITY ANGIOGRAPHY Right 02/05/2023   Procedure: Lower Extremity Angiography;  Surgeon: Jama Cordella MATSU, MD;  Location: [facility];  Service: Cardiovascular;  Laterality: Right;   Mesh implant right leg  2015   THYROIDECTOMY  2013   TUBAL LIGATION  1993     Current Outpatient Medications  Medication Sig Dispense Refill   Accu-Chek Softclix Lancets lancets Use as instructed 100 each 12   albuterol  (VENTOLIN  HFA) 108 (90 Base) MCG/ACT inhaler Inhale 2 puffs into the lungs every 6 (six) hours as needed for wheezing. 36 g 4   aspirin  EC 81 MG tablet Take 81 mg by mouth daily. Swallow whole.     atorvastatin  (LIPITOR) 80 MG tablet Take 1 tablet (80 mg total) by mouth daily. 90 tablet 3   baclofen  (LIORESAL )  10 MG tablet Take 1 tablet (10 mg total) by mouth every 12 (twelve) hours as needed for muscle spasms. 180 tablet 3   BD PEN NEEDLE MINI ULTRAFINE 31G X 5 MM MISC USE AS DIRECTED 100 each 0   budesonide  (PULMICORT ) 1 MG/2ML nebulizer solution INHALE 2 ML BY NEBULIZATION TWICE DAILY 120 mL 0   Cholecalciferol  1.25 MG (50000 UT) capsule Vitamin D3 1.25 MG (50000 UT) Oral Capsule QTY: 0 capsule Days: 0 Refills: 0  Written: 02/09/20 Patient Instructions: qaw 12 capsule 3   cloNIDine  (CATAPRES ) 0.1 MG tablet Take 1 tablet (0.1 mg total) by mouth at bedtime. 90 tablet 3   clopidogrel  (PLAVIX ) 75 MG tablet Take 1 tablet by mouth once daily 90 tablet 0   fexofenadine  (ALLEGRA ) 180 MG tablet Take 1 tablet (180 mg total) by mouth every morning. 90 tablet 3   glucose blood test strip Use as instructed 100 each 12   insulin  glargine (LANTUS  SOLOSTAR) 100 UNIT/ML Solostar Pen Inject 23 Units into the skin at bedtime. 15 mL 6   ipratropium (ATROVENT ) 0.03 % nasal spray Place 2 sprays into the nose 3 (three) times daily. 30 mL 2   ipratropium-albuterol  (DUONEB) 0.5-2.5 (3) MG/3ML SOLN Take 3 mLs by nebulization every 6 (six) hours as needed. 360  mL 6   levothyroxine  (SYNTHROID ) 125 MCG tablet Take 1 tablet (125 mcg total) by mouth daily. 30 tablet 11   metFORMIN  (GLUCOPHAGE ) 1000 MG tablet Take 1 tablet (1,000 mg total) by mouth every morning. 90 tablet 3   methocarbamol (ROBAXIN) 500 MG tablet Take 1 tablet by mouth every 8 (eight) hours.     montelukast  (SINGULAIR ) 10 MG tablet Take 1 tablet (10 mg total) by mouth at bedtime. 90 tablet 2   oxyCODONE -acetaminophen  (PERCOCET) 10-325 MG tablet Take 1 tablet by mouth 4 (four) times daily as needed.     pantoprazole  (PROTONIX ) 40 MG tablet Take 1 tablet by mouth twice daily 60 tablet 0   potassium chloride  (KLOR-CON  10) 10 MEQ tablet Take 1 tablet (10 mEq total) by mouth daily. 30 tablet 4   pregabalin  (LYRICA ) 75 MG capsule TAKE 1 CAPSULE BY MOUTH THREE TIMES DAILY  90 capsule 0   SPIRIVA  HANDIHALER 18 MCG inhalation capsule Inhale 1 puff by mouth once daily 30 capsule 0   temazepam (RESTORIL) 15 MG capsule Take 15 mg by mouth at bedtime as needed.     TRELEGY ELLIPTA  100-62.5-25 MCG/ACT AEPB Inhale 1 puff into the lungs daily. 60 each 12   triamcinolone  ointment (KENALOG ) 0.5 % Apply 1 Application topically 2 (two) times daily. 30 g 0   furosemide  (LASIX ) 40 MG tablet Take 1 tablet (40 mg total) by mouth daily. 90 tablet 0   No current facility-administered medications for this visit.    Allergies:   Iobenguane i 131, Advair diskus [fluticasone -salmeterol], Iodinated contrast media, and Iodine     Social History:   reports that she has been smoking cigarettes. She has a 22.5 pack-year smoking history. She has never used smokeless tobacco. She reports that she does not currently use alcohol. She reports that she does not currently use drugs.   Family History:  family history includes Cancer in her sister; Clotting disorder in her sister; Depression in her mother and sister; Heart failure in her mother; Peripheral Artery Disease in her sister; Thyroid disease in her sister.    ROS:     Review of Systems  Constitutional: Negative.   HENT: Negative.    Eyes: Negative.   Respiratory: Negative.    Gastrointestinal: Negative.   Genitourinary: Negative.   Musculoskeletal: Negative.   Skin: Negative.   Neurological: Negative.   Endo/Heme/Allergies: Negative.   Psychiatric/Behavioral: Negative.    All other systems reviewed and are negative.     All other systems are reviewed and negative.    PHYSICAL EXAM: VS:  BP 122/74   Pulse 88   Ht 5' 3 (1.6 m)   Wt 156 lb 3.2 oz (70.9 kg)   SpO2 97%   BMI 27.67 kg/m  , BMI Body mass index is 27.67 kg/m. Last weight:  Wt Readings from Last 3 Encounters:  11/05/23 156 lb 3.2 oz (70.9 kg)  11/01/23 156 lb (70.8 kg)  10/03/23 157 lb (71.2 kg)     Physical Exam Constitutional:      Appearance:  Normal appearance.  Cardiovascular:     Rate and Rhythm: Normal rate and regular rhythm.     Heart sounds: Normal heart sounds.  Pulmonary:     Effort: Pulmonary effort is normal.     Breath sounds: Normal breath sounds.  Musculoskeletal:     Right lower leg: No edema.     Left lower leg: No edema.  Neurological:     Mental Status: She is alert.  EKG:   Recent Labs: 09/30/2023: ALT 13; BUN 12; Creatinine, Ser 1.15; Hemoglobin 13.8; Platelets 339; Potassium 3.2; Sodium 140; TSH 1.940    Lipid Panel    Component Value Date/Time   CHOL 121 09/30/2023 1101   TRIG 392 (H) 09/30/2023 1101   HDL 23 (L) 09/30/2023 1101   CHOLHDL 5.3 (H) 09/30/2023 1101   LDLCALC 40 09/30/2023 1101      Other studies Reviewed: Additional studies/ records that were reviewed today include:  Review of the above records demonstrates:       No data to display            ASSESSMENT AND PLAN:    ICD-10-CM   1. Preop cardiovascular exam  Z01.810    May stop plavix  7 days prior to dental extraction and colonoscopy.    2. Mixed hyperlipidemia  E78.2 furosemide  (LASIX ) 40 MG tablet    PCV ECHOCARDIOGRAM COMPLETE    3. Type 2 diabetes mellitus with diabetic peripheral angiopathy without gangrene, with long-term current use of insulin  (HCC)  E11.51 furosemide  (LASIX ) 40 MG tablet   Z79.4 PCV ECHOCARDIOGRAM COMPLETE    4. Chronic obstructive pulmonary disease, unspecified COPD type (HCC)  J44.9 furosemide  (LASIX ) 40 MG tablet    PCV ECHOCARDIOGRAM COMPLETE    5. Primary hypertension  I10 furosemide  (LASIX ) 40 MG tablet    PCV ECHOCARDIOGRAM COMPLETE    6. Obesity, Class I, BMI 30-34.9  E66.811 furosemide  (LASIX ) 40 MG tablet    PCV ECHOCARDIOGRAM COMPLETE    7. Gastroesophageal reflux disease without esophagitis  K21.9 furosemide  (LASIX ) 40 MG tablet    PCV ECHOCARDIOGRAM COMPLETE    8. 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 Atherosclerosis of native artery of right lower extremity with rest pain (HCC)  I70.221     10. PAD (peripheral artery disease)  I73.9    Taking plavix  and asp.       Problem List Items Addressed This Visit       Cardiovascular and Mediastinum   PAD (peripheral artery disease)   Relevant Medications   furosemide  (LASIX ) 40 MG tablet   Atherosclerosis of native arteries of extremity with intermittent claudication   Relevant Medications   furosemide  (LASIX ) 40 MG tablet   Atherosclerosis of native arteries of extremity with rest pain (HCC)   Relevant Medications   furosemide  (LASIX ) 40 MG tablet     Respiratory   COPD (chronic obstructive pulmonary disease) (HCC)   Relevant Medications   furosemide  (LASIX ) 40 MG tablet   Other Relevant Orders   PCV ECHOCARDIOGRAM COMPLETE     Digestive   GERD (gastroesophageal reflux disease)   Relevant Medications   furosemide  (LASIX ) 40 MG tablet   Other Relevant Orders   PCV ECHOCARDIOGRAM COMPLETE     Endocrine   Diabetes (HCC)   Relevant Medications   furosemide  (LASIX ) 40 MG tablet   Other Relevant Orders   PCV ECHOCARDIOGRAM COMPLETE     Other   Hyperlipidemia   Relevant Medications   furosemide  (LASIX ) 40 MG tablet   Other Relevant Orders   PCV ECHOCARDIOGRAM COMPLETE   Other Visit Diagnoses       Preop cardiovascular exam    -  Primary   May stop plavix  7 days prior to dental extraction and colonoscopy.     Primary hypertension       Relevant Medications   furosemide  (LASIX ) 40 MG tablet  Other Relevant Orders   PCV ECHOCARDIOGRAM COMPLETE     Obesity, Class I, BMI 30-34.9       Relevant Medications   furosemide  (LASIX ) 40 MG tablet   Other Relevant Orders   PCV ECHOCARDIOGRAM COMPLETE          Disposition:   Return in about 6 weeks (around 12/17/2023) for echo and f/u.    Total time spent: 35 minutes  Signed,  Denyse Bathe, MD  11/05/2023 2:07 PM    [facility]

## 2023-11-08 ENCOUNTER — Ambulatory Visit: Payer: Self-pay | Admitting: Cardiology

## 2023-11-11 ENCOUNTER — Ambulatory Visit

## 2023-11-12 ENCOUNTER — Encounter: Payer: Self-pay | Admitting: Cardiovascular Disease

## 2023-11-12 NOTE — Progress Notes (Signed)
Phone number invalid

## 2023-11-14 NOTE — Progress Notes (Signed)
Pt informed

## 2023-11-20 ENCOUNTER — Ambulatory Visit

## 2023-11-20 DIAGNOSIS — Z9189 Other specified personal risk factors, not elsewhere classified: Secondary | ICD-10-CM | POA: Diagnosis not present

## 2023-11-20 DIAGNOSIS — M8589 Other specified disorders of bone density and structure, multiple sites: Secondary | ICD-10-CM

## 2023-11-21 ENCOUNTER — Telehealth: Payer: Self-pay

## 2023-11-21 NOTE — Telephone Encounter (Signed)
 Pt return your call to schedule procedure .

## 2023-11-22 ENCOUNTER — Telehealth: Payer: Self-pay

## 2023-11-22 DIAGNOSIS — Z800 Family history of malignant neoplasm of digestive organs: Secondary | ICD-10-CM

## 2023-11-22 DIAGNOSIS — Z1211 Encounter for screening for malignant neoplasm of colon: Secondary | ICD-10-CM

## 2023-11-22 NOTE — Telephone Encounter (Signed)
 Gastroenterology Pre-Procedure Review  Request Date: TBD Requesting Physician: Dr. NELLIE  PATIENT REVIEW QUESTIONS: The patient responded to the following health history questions as indicated:    1. Are you having any GI issues? no 2. Do you have a personal history of Polyps? no 3. Do you have a family history of Colon Cancer or Polyps? yes (sister colon cancer) 4. Diabetes Mellitus? yes (takes insulin  lantus  solostar and metformin  (2) day stop) 5. Joint replacements in the past 12 months? Stent placement 6. Major health problems in the past 3 months?no 7. Any artificial heart valves, MVP, or defibrillator?no    MEDICATIONS & ALLERGIES:    Patient reports the following regarding taking any anticoagulation/antiplatelet therapy:   Plavix , Coumadin, Eliquis, Xarelto, Lovenox , Pradaxa, Brilinta, or Effient? yes (Clopidogrel  blood thinner advice faxed to Dr. Kathern office) Aspirin ? yes (81 mg daily)  Patient confirms/reports the following medications:  Current Outpatient Medications  Medication Sig Dispense Refill   Accu-Chek Softclix Lancets lancets Use as instructed 100 each 12   albuterol  (VENTOLIN  HFA) 108 (90 Base) MCG/ACT inhaler Inhale 2 puffs into the lungs every 6 (six) hours as needed for wheezing. 36 g 4   Alcohol Swabs (ALCOHOL PREP) 70 % PADS Use with diabetic supplies to clean finger before checking sugars 100 each 2   aspirin  EC 81 MG tablet Take 81 mg by mouth daily. Swallow whole.     atorvastatin  (LIPITOR) 80 MG tablet Take 1 tablet (80 mg total) by mouth daily. 90 tablet 3   baclofen  (LIORESAL ) 10 MG tablet Take 1 tablet (10 mg total) by mouth every 12 (twelve) hours as needed for muscle spasms. 180 tablet 3   BD PEN NEEDLE MINI ULTRAFINE 31G X 5 MM MISC USE AS DIRECTED 100 each 0   budesonide  (PULMICORT ) 1 MG/2ML nebulizer solution INHALE 2 ML BY NEBULIZATION TWICE DAILY 120 mL 0   Cholecalciferol  1.25 MG (50000 UT) capsule Vitamin D3 1.25 MG (50000 UT) Oral Capsule QTY:  0 capsule Days: 0 Refills: 0  Written: 02/09/20 Patient Instructions: qaw 12 capsule 3   cloNIDine  (CATAPRES ) 0.1 MG tablet Take 1 tablet (0.1 mg total) by mouth at bedtime. 90 tablet 3   clopidogrel  (PLAVIX ) 75 MG tablet Take 1 tablet by mouth once daily 90 tablet 0   fexofenadine  (ALLEGRA ) 180 MG tablet Take 1 tablet (180 mg total) by mouth every morning. 90 tablet 3   furosemide  (LASIX ) 40 MG tablet Take 1 tablet (40 mg total) by mouth daily. 90 tablet 0   glucose blood test strip Use as instructed 100 each 12   insulin  glargine (LANTUS  SOLOSTAR) 100 UNIT/ML Solostar Pen Inject 23 Units into the skin at bedtime. 15 mL 6   ipratropium (ATROVENT ) 0.03 % nasal spray Place 2 sprays into the nose 3 (three) times daily. 30 mL 2   ipratropium-albuterol  (DUONEB) 0.5-2.5 (3) MG/3ML SOLN Take 3 mLs by nebulization every 6 (six) hours as needed. 360 mL 6   levothyroxine  (SYNTHROID ) 125 MCG tablet Take 1 tablet (125 mcg total) by mouth daily. 30 tablet 11   metFORMIN  (GLUCOPHAGE ) 1000 MG tablet Take 1 tablet (1,000 mg total) by mouth every morning. 90 tablet 3   methocarbamol (ROBAXIN) 500 MG tablet Take 1 tablet by mouth every 8 (eight) hours.     methylPREDNISolone  (MEDROL  DOSEPAK) 4 MG TBPK tablet As directed 1 each 0   montelukast  (SINGULAIR ) 10 MG tablet Take 1 tablet (10 mg total) by mouth at bedtime. 90 tablet 2  oxyCODONE -acetaminophen  (PERCOCET) 10-325 MG tablet Take 1 tablet by mouth 4 (four) times daily as needed.     pantoprazole  (PROTONIX ) 40 MG tablet Take 1 tablet by mouth twice daily 60 tablet 0   potassium chloride  (KLOR-CON  10) 10 MEQ tablet Take 1 tablet (10 mEq total) by mouth daily. 30 tablet 4   pregabalin  (LYRICA ) 75 MG capsule TAKE 1 CAPSULE BY MOUTH THREE TIMES DAILY 90 capsule 0   SPIRIVA  HANDIHALER 18 MCG inhalation capsule Inhale 1 puff by mouth once daily 30 capsule 0   temazepam (RESTORIL) 15 MG capsule Take 15 mg by mouth at bedtime as needed.     TRELEGY ELLIPTA   100-62.5-25 MCG/ACT AEPB Inhale 1 puff into the lungs daily. 60 each 12   triamcinolone  ointment (KENALOG ) 0.5 % Apply 1 Application topically 2 (two) times daily. 30 g 0   No current facility-administered medications for this visit.    Patient confirms/reports the following allergies:  Allergies  Allergen Reactions   Iobenguane I 131 Hives   Advair Diskus [Fluticasone -Salmeterol] Hives   Iodinated Contrast Media Hives   Iodine  Hives and Rash    Other reaction(s): Skin Rashes, Hives  Pt states she need premedication for contrast exposure    No orders of the defined types were placed in this encounter.   AUTHORIZATION INFORMATION Primary Insurance: 1D#: Group #:  Secondary Insurance: 1D#: Group #:  SCHEDULE INFORMATION: Date: TBD Time: Location: [facility]

## 2023-12-04 ENCOUNTER — Other Ambulatory Visit

## 2023-12-06 ENCOUNTER — Other Ambulatory Visit: Payer: Self-pay | Admitting: Cardiology

## 2023-12-10 ENCOUNTER — Ambulatory Visit: Payer: Self-pay | Admitting: Cardiology

## 2023-12-11 NOTE — Progress Notes (Signed)
 Vm not set up

## 2023-12-12 NOTE — Telephone Encounter (Signed)
   Per patients office visit with Dr. Fernand dated 11/05/23  ASSESSMENT AND PLAN:       ICD-10-CM    1. Preop cardiovascular exam  Z01.810      May stop plavix  7 days prior to dental extraction and colonoscopy.       Unable to contact patient to schedule.  Call cannot be completed as dialed  Letter mailed  Thanks,  Rosaline, CMA

## 2023-12-13 NOTE — Progress Notes (Signed)
 Pt. Notified of her bone density results

## 2023-12-15 ENCOUNTER — Other Ambulatory Visit: Payer: Self-pay | Admitting: Cardiovascular Disease

## 2023-12-18 ENCOUNTER — Other Ambulatory Visit: Payer: Self-pay | Admitting: Family

## 2023-12-18 DIAGNOSIS — E1151 Type 2 diabetes mellitus with diabetic peripheral angiopathy without gangrene: Secondary | ICD-10-CM

## 2023-12-19 ENCOUNTER — Other Ambulatory Visit: Payer: Self-pay | Admitting: Cardiology

## 2023-12-25 ENCOUNTER — Other Ambulatory Visit

## 2023-12-27 ENCOUNTER — Ambulatory Visit: Admitting: Cardiovascular Disease

## 2024-01-03 ENCOUNTER — Ambulatory Visit

## 2024-01-13 ENCOUNTER — Ambulatory Visit: Admitting: Cardiovascular Disease

## 2024-01-17 ENCOUNTER — Other Ambulatory Visit: Payer: Self-pay | Admitting: Cardiology

## 2024-01-22 ENCOUNTER — Other Ambulatory Visit: Payer: Self-pay | Admitting: Family

## 2024-01-22 ENCOUNTER — Other Ambulatory Visit: Payer: Self-pay | Admitting: Internal Medicine

## 2024-01-22 ENCOUNTER — Other Ambulatory Visit: Payer: Self-pay | Admitting: Cardiology

## 2024-01-22 DIAGNOSIS — E1151 Type 2 diabetes mellitus with diabetic peripheral angiopathy without gangrene: Secondary | ICD-10-CM

## 2024-01-31 ENCOUNTER — Ambulatory Visit

## 2024-01-31 DIAGNOSIS — J449 Chronic obstructive pulmonary disease, unspecified: Secondary | ICD-10-CM

## 2024-01-31 DIAGNOSIS — E1151 Type 2 diabetes mellitus with diabetic peripheral angiopathy without gangrene: Secondary | ICD-10-CM

## 2024-01-31 DIAGNOSIS — E782 Mixed hyperlipidemia: Secondary | ICD-10-CM

## 2024-01-31 DIAGNOSIS — I361 Nonrheumatic tricuspid (valve) insufficiency: Secondary | ICD-10-CM

## 2024-01-31 DIAGNOSIS — I10 Essential (primary) hypertension: Secondary | ICD-10-CM

## 2024-01-31 DIAGNOSIS — K219 Gastro-esophageal reflux disease without esophagitis: Secondary | ICD-10-CM

## 2024-01-31 DIAGNOSIS — E66811 Obesity, class 1: Secondary | ICD-10-CM

## 2024-01-31 DIAGNOSIS — I340 Nonrheumatic mitral (valve) insufficiency: Secondary | ICD-10-CM

## 2024-02-07 ENCOUNTER — Ambulatory Visit: Admitting: Cardiovascular Disease

## 2024-02-12 ENCOUNTER — Other Ambulatory Visit: Payer: Self-pay | Admitting: Cardiology

## 2024-02-14 ENCOUNTER — Ambulatory Visit: Admitting: Cardiology
